# Patient Record
Sex: Female | Born: 1990 | Race: White | Hispanic: No | Marital: Single | Smoking: Never smoker
Health system: Southern US, Community
[De-identification: ages and names within clinical notes are randomized; demographics above are authoritative.]

## PROBLEM LIST (undated history)

## (undated) DIAGNOSIS — A749 Chlamydial infection, unspecified: Secondary | ICD-10-CM

## (undated) DIAGNOSIS — A159 Respiratory tuberculosis unspecified: Secondary | ICD-10-CM

## (undated) HISTORY — PX: ADENOIDECTOMY: SUR15

## (undated) HISTORY — PX: TYMPANOSTOMY TUBE PLACEMENT: SHX32

## (undated) HISTORY — PX: TONSILLECTOMY: SUR1361

## (undated) HISTORY — DX: Chlamydial infection, unspecified: A74.9

---
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
## 2010-06-01 ENCOUNTER — Encounter: Payer: Self-pay | Admitting: Otolaryngology

## 2010-09-23 NOTE — Op Note (Signed)
NAME:  Kimberly May, Kimberly May               ACCOUNT NO.:  [id number]   MEDICAL RECORD NO.:  [id number]          PATIENT TYPE:  INP   LOCATION:  9122                          FACILITY:  [facility]   PHYSICIAN:  Tilda Burrow, M.D. DATE OF BIRTH:  Aug 05, 1990   DATE OF PROCEDURE:  08/04/2007  DATE OF DISCHARGE:                               OPERATIVE REPORT   PREOPERATIVE DIAGNOSES:  1. Pregnancy 40+4 weeks.  2. Medical induction of labor.  3. Nonreassuring fetal heart rate status.  4. Light meconium discolored amniotic fluid.   POSTOPERATIVE DIAGNOSES:  1. Pregnancy 40+4 weeks.  2. Medical induction of labor.  3. Nonreassuring fetal heart rate status.  4. Light meconium discolored amniotic fluid.   PROCEDURE:  Primary low transverse cervical cesarean section.   SURGEON:  Ferguson.   ASSISTANT:  None.   ANESTHESIA:  Epidural.   COMPLICATIONS:  None.   FINDINGS:  A 6 pound 4 ounce female infant, light meconium discolored  amniotic fluid.  Presenting part had not entered the pelvic inlet.  Also, findings should include leg cord x1.   INDICATION:  A 20 year old admitted at 40+4 after prodromal labor with  cervix 2, 50%, -2.  Anterior cervix with Foley bulb cervical ripening  and discontinuation of Pitocin induction after she developed recurrent  late decelerations and occasional variable decelerations.  Amnio  infusion had been attempted, epidural catheter was in place, cervix  never progressed past 5 cm, 50%, -2 to -3 station.   DETAILS OF PROCEDURE:  The patient was taken to the operating room,  prepped and draped, analgesia confirmed with the epidural in place and  Pfannenstiel incision performed in standard fashion.  The presenting  part had really never entered the pelvis.  The transverse uterine  incision was performed, easily extended laterally with index finger  traction and the upward slightly, allowing rotation of the vertex into  the incision.  There was posterior knot molding  through the cervix.  This was rotated in the incision from the left occiput transverse  incision and then the baby delivered by fundal pressure.  The cord was  clamped and the baby placed in the care of the pediatrician in  attendance.  Apgars 9 and 9 were assigned.  There was no malodor to the  amniotic fluid which did have light meconium discoloration.  Membrane  showed light meconium discoloration as well.  Placenta delivered easily  intact, Schultze presentation, one tiny remnant of membrane was  extracted from inside the uterus.  The uterus was irrigated and  confirmed as being empty.  The uterus was closed with a single layer of  running locking 0 Chromic.  Bladder flap was loosely reapproximated with  running #2-0 Chromic.  The peritoneal cavity was inspected, confirmed as  hemostatic and adequately evacuated and closed with #2-0 Chromic.  The  fascia was closed with running #0 Vicryl, subcu fatty tissue  approximated with interrupted #2-0 plain sutures.  Staple closure of the  skin completed the procedure.  The patient went to the recovery room in  excellent condition with an EBL of 500 mL.  Sponge and needle counts  were correct.      Tilda Burrow, M.D.  Electronically Signed     JVF/MEDQ  D:  08/04/2007  T:  08/04/2007  Job:  [id number]   cc:   Donna Bernard, M.D.  Fax: [phone]

## 2010-09-23 NOTE — H&P (Signed)
NAME:  Kimberly May, Kimberly May               ACCOUNT NO.:  [id number]   MEDICAL RECORD NO.:  [id number]           PATIENT TYPE:   LOCATION:                                 FACILITY:   PHYSICIAN:  Tilda Burrow, M.D.      DATE OF BIRTH:   DATE OF ADMISSION:  DATE OF DISCHARGE:                              HISTORY & PHYSICAL   ADMITTING DIAGNOSES:  1. Pregnancy 40 weeks, 4 days.  2. Impending post dates.  3. Elective induction of labor.  4. Cervical favorability.   HISTORY OF PRESENT ILLNESS:  This 20 year old female, gravida 1, para 0,  is admitted for induction of labor. Gestational criteria as follows: LMP  10/23/06 placing her EDC at 07/30/07.  She is 40 weeks, 4 days for this  criteria ultrasound and 7 weeks 3 days on 12/10/07 placing her EDC at  3/17 making her 41 weeks 1 day and 20 week ultrasound as the same EDC of  07/26/07.  She is therefore admitted for a fully evoked cervical ripening  and Pitocin induction of labor.  She has been having lots of  contractions and pain in the last week.  Cervix has changed in the last  week and is now 1 to 2-cm 25% effaced, midposition, and a minus 2  station vertex presentation as well applied  to the cervix.   Pregnant labs include blood type A positive, Rubella immunity present,  urine direct screen negative, hemoglobin 12, hematocrit 38, Hepatitis  HIV, RPR, GC and Chlamydia all negative, MSAF normal, Group B Strep  negative. Glucose tolerance test normal at 124 mg/%.  If she has a boy,  desires circumcision, plans to bottle fed and desires Implanon  contraception in the future.  The baby will be taken to Dr. Simone Curia of [facility].   IMPRESSION:  1. Pregnancy 40 + 4 to 41 weeks.  2. Medical induction of labor.   PLAN:  Fully evoked cervical ripening times 4 hours and Pitocin  induction of labor, will likely require epidural.      Tilda Burrow, M.D.  Electronically Signed     JVF/MEDQ  D:  08/03/2007  T:   08/03/2007  Job:  [id number]   cc:   Donna Bernard, M.D.  Fax: [phone]

## 2010-09-23 NOTE — Discharge Summary (Signed)
NAME:  Kimberly May, Kimberly May               ACCOUNT NO.:  [id number]   MEDICAL RECORD NO.:  [id number]          PATIENT TYPE:  INP   LOCATION:  9122                          FACILITY:  [facility]   PHYSICIAN:  Tilda Burrow, M.D. DATE OF BIRTH:  [birth year]   DATE OF ADMISSION:  08/03/2007  DATE OF DISCHARGE:  08/07/2007                               DISCHARGE SUMMARY   DIAGNOSIS:  Induction for post date, primary low-transverse cesarean  section for nonreassuring fetal heart rate, on August 04, 2007, by Dr.  Emelda Fear.   POSTOPERATIVE DIAGNOSES:  Induction for post date, primary low-  transverse cesarean section for nonreassuring fetal heart rate, on August 04, 2007, by Dr. Emelda Fear.   HOSPITAL COURSE:  Kimberly May has had no problems during her hospital course.  She is taking p.o. solids and fluids well, up ambulating well, passing  gas, emptying her bladder without problems and desires discharge today.   PHYSICAL EXAMINATION:  VITAL SIGNS:  Stable.  HEART:  Regular rhythm and rate.  LUNGS:  Clear to auscultation bilaterally.  ABDOMEN:  Soft and nontender, bowel sounds x4.  Incision intact.  No  redness, swelling, or drainage.  Lochia small amount.  Fundus firm.  __________ .  Trace edema in the lower extremities.   ASSESSMENT:  Stable postoperative day #3.   DISCHARGE MEDICATIONS:  As follows:  1. Motrin 800 mg 1 p.o. q.8 h. p.r.n. pain and discomfort.  2. Percocet 5/325 one p.o. q.4 h. p.r.n. pain.  3. Chromagen Forte 1 p.o. b.i.d.   PLAN:  She has to follow up Wednesday at Dr. Rayna Sexton office for  staple removal or p.r.n.      Zerita Boers, Lanier Clam      Tilda Burrow, M.D.  Electronically Signed   DL/MEDQ  D:  91/47/8295  T:  08/08/2007  Job:  [id number]   cc:   Tilda Burrow, M.D.  Fax: [phone]

## 2011-02-02 LAB — CBC
Hemoglobin: 13.1
Platelets: 216
RBC: 3.54 — ABNORMAL LOW
RDW: 13.7
WBC: 12.4

## 2011-02-26 LAB — RAPID STREP SCREEN (MED CTR MEBANE ONLY): Streptococcus, Group A Screen (Direct): NEGATIVE

## 2011-02-26 LAB — STREP A DNA PROBE: Group A Strep Probe: NEGATIVE

## 2011-03-27 ENCOUNTER — Encounter (HOSPITAL_COMMUNITY): Payer: Self-pay

## 2011-03-27 ENCOUNTER — Encounter (HOSPITAL_COMMUNITY): Payer: Self-pay | Admitting: Pharmacy Technician

## 2011-03-27 ENCOUNTER — Encounter (HOSPITAL_COMMUNITY)
Admission: RE | Admit: 2011-03-27 | Discharge: 2011-03-27 | Disposition: A | Discharge status: Home / Self Care | Payer: Medicaid Other | Source: Ambulatory Visit | Attending: Otolaryngology | Admitting: Otolaryngology

## 2011-03-27 HISTORY — DX: Respiratory tuberculosis unspecified: A15.9

## 2011-03-27 LAB — SURGICAL PCR SCREEN
MRSA, PCR: NEGATIVE
Staphylococcus aureus: NEGATIVE

## 2011-03-27 LAB — CBC
Hemoglobin: 13.9 g/dL (ref 12.0–15.0)
RBC: 4.91 MIL/uL (ref 3.87–5.11)

## 2011-03-27 LAB — HCG, SERUM, QUALITATIVE: Preg, Serum: NEGATIVE

## 2011-03-27 NOTE — Pre-Procedure Instructions (Signed)
20 [place] T Farewell  03/27/2011   Your procedure is scheduled on:   Friday 04/10/11   Report to [facility] at 530 AM.  Call this number if you have problems the morning of surgery: [phone]   Remember:   Do not eat food:After Midnight.  Do not drink clear liquids: 4 Hours before arrival.  Take these medicines the morning of surgery with A SIP OF WATER:   Do not wear jewelry, make-up or nail polish.  Do not wear lotions, powders, or perfumes. You may wear deodorant.  Do not shave 48 hours prior to surgery.  Do not bring valuables to the hospital.  Contacts, dentures or bridgework may not be worn into surgery.  Leave suitcase in the car. After surgery it may be brought to your room.  For patients admitted to the hospital, checkout time is 11:00 AM the day of discharge.   Patients discharged the day of surgery will not be allowed to drive home.  Name and phone number of your driver:   MOM   Special Instructions: CHG Shower Use Special Wash: 1/2 bottle night before surgery and 1/2 bottle morning of surgery.   Please read over the following fact sheets that you were given: Pain Booklet, MRSA Information and Surgical Site Infection Prevention

## 2011-04-08 ENCOUNTER — Other Ambulatory Visit: Payer: Self-pay | Admitting: Otolaryngology

## 2011-04-10 ENCOUNTER — Observation Stay (HOSPITAL_COMMUNITY)
Admission: RE | Admit: 2011-04-10 | Discharge: 2011-04-10 | Disposition: A | Discharge status: Home / Self Care | Payer: Medicaid Other | Source: Ambulatory Visit | Attending: Otolaryngology | Admitting: Otolaryngology

## 2011-04-10 ENCOUNTER — Ambulatory Visit (HOSPITAL_COMMUNITY): Payer: Medicaid Other | Admitting: Certified Registered"

## 2011-04-10 ENCOUNTER — Encounter (HOSPITAL_COMMUNITY)
Admission: RE | Disposition: A | Discharge status: Home / Self Care | Payer: Self-pay | Source: Ambulatory Visit | Attending: Otolaryngology

## 2011-04-10 ENCOUNTER — Encounter (HOSPITAL_COMMUNITY): Payer: Self-pay | Admitting: Certified Registered"

## 2011-04-10 ENCOUNTER — Other Ambulatory Visit: Payer: Self-pay | Admitting: Otolaryngology

## 2011-04-10 DIAGNOSIS — H6990 Unspecified Eustachian tube disorder, unspecified ear: Secondary | ICD-10-CM | POA: Insufficient documentation

## 2011-04-10 DIAGNOSIS — J351 Hypertrophy of tonsils: Principal | ICD-10-CM | POA: Diagnosis present

## 2011-04-10 DIAGNOSIS — H7290 Unspecified perforation of tympanic membrane, unspecified ear: Secondary | ICD-10-CM | POA: Diagnosis present

## 2011-04-10 DIAGNOSIS — Z01812 Encounter for preprocedural laboratory examination: Secondary | ICD-10-CM | POA: Insufficient documentation

## 2011-04-10 DIAGNOSIS — H6980 Other specified disorders of Eustachian tube, unspecified ear: Secondary | ICD-10-CM | POA: Diagnosis present

## 2011-04-10 HISTORY — PX: TONSILLECTOMY: SHX5217

## 2011-04-10 SURGERY — TONSILLECTOMY
Anesthesia: General | Site: Mouth | Laterality: Right | Wound class: Clean Contaminated

## 2011-04-10 MED ORDER — CIPROFLOXACIN-DEXAMETHASONE 0.3-0.1 % OT SUSP
OTIC | Status: DC | PRN
  Administered 2011-04-10: 1 [drp] via OTIC

## 2011-04-10 MED ORDER — ONDANSETRON HCL 4 MG/2ML IJ SOLN: 4.0000 mg | Freq: Once | INTRAMUSCULAR | Status: DC | PRN

## 2011-04-10 MED ORDER — GLYCOPYRROLATE 0.2 MG/ML IJ SOLN
INTRAMUSCULAR | Status: DC | PRN
  Administered 2011-04-10: .4 mg via INTRAVENOUS

## 2011-04-10 MED ORDER — MORPHINE SULFATE 4 MG/ML IJ SOLN
2.0000 mg | INTRAMUSCULAR | Status: DC | PRN
  Administered 2011-04-10: 4 mg via INTRAVENOUS
  Filled 2011-04-10: qty 1

## 2011-04-10 MED ORDER — DEXAMETHASONE SODIUM PHOSPHATE 4 MG/ML IJ SOLN
INTRAMUSCULAR | Status: DC | PRN
  Administered 2011-04-10: 12 mg via INTRAVENOUS

## 2011-04-10 MED ORDER — NEOSTIGMINE METHYLSULFATE 1 MG/ML IJ SOLN
INTRAMUSCULAR | Status: DC | PRN
  Administered 2011-04-10: 3 mg via INTRAVENOUS

## 2011-04-10 MED ORDER — CIPROFLOXACIN-DEXAMETHASONE 0.3-0.1 % OT SUSP
4.0000 [drp] | Freq: Two times a day (BID) | OTIC | Status: DC
  Filled 2011-04-10: qty 7.5

## 2011-04-10 MED ORDER — MEPERIDINE HCL 25 MG/ML IJ SOLN
6.2500 mg | INTRAMUSCULAR | Status: DC | PRN
Start: 2011-04-10 — End: 2011-04-10

## 2011-04-10 MED ORDER — ROCURONIUM BROMIDE 100 MG/10ML IV SOLN
INTRAVENOUS | Status: DC | PRN
  Administered 2011-04-10: 35 mg via INTRAVENOUS

## 2011-04-10 MED ORDER — PROPOFOL 10 MG/ML IV EMUL
INTRAVENOUS | Status: DC | PRN
  Administered 2011-04-10: 150 mg via INTRAVENOUS

## 2011-04-10 MED ORDER — INFLUENZA VIRUS VACC SPLIT PF IM SUSP: 0.2500 mL | INTRAMUSCULAR | Status: DC

## 2011-04-10 MED ORDER — HYDROCODONE-ACETAMINOPHEN 7.5-500 MG/15ML PO SOLN: 15.0000 mL | ORAL | Status: AC | PRN

## 2011-04-10 MED ORDER — PROMETHAZINE HCL 25 MG RE SUPP
12.5000 mg | Freq: Four times a day (QID) | RECTAL | Status: DC | PRN
  Administered 2011-04-10: 14:00:00 via RECTAL
  Filled 2011-04-10: qty 1

## 2011-04-10 MED ORDER — ONDANSETRON HCL 4 MG/2ML IJ SOLN
INTRAMUSCULAR | Status: DC | PRN
  Administered 2011-04-10: 4 mg via INTRAVENOUS

## 2011-04-10 MED ORDER — MIDAZOLAM HCL 5 MG/5ML IJ SOLN
INTRAMUSCULAR | Status: DC | PRN
  Administered 2011-04-10: 2 mg via INTRAVENOUS

## 2011-04-10 MED ORDER — KCL IN DEXTROSE-NACL 20-5-0.45 MEQ/L-%-% IV SOLN
INTRAVENOUS | Status: DC
  Administered 2011-04-10: 1000 mL via INTRAVENOUS
  Filled 2011-04-10 (×3): qty 1000

## 2011-04-10 MED ORDER — PROMETHAZINE HCL 25 MG PO TABS: 12.5000 mg | ORAL_TABLET | Freq: Four times a day (QID) | ORAL | Status: DC | PRN

## 2011-04-10 MED ORDER — LACTATED RINGERS IV SOLN
INTRAVENOUS | Status: DC | PRN
  Administered 2011-04-10: 07:00:00 via INTRAVENOUS

## 2011-04-10 MED ORDER — HYDROMORPHONE HCL PF 1 MG/ML IJ SOLN
0.2500 mg | INTRAMUSCULAR | Status: DC | PRN
  Administered 2011-04-10: 0.5 mg via INTRAVENOUS

## 2011-04-10 MED ORDER — FENTANYL CITRATE 0.05 MG/ML IJ SOLN
INTRAMUSCULAR | Status: DC | PRN
  Administered 2011-04-10: 50 ug via INTRAVENOUS
  Administered 2011-04-10: 100 ug via INTRAVENOUS
  Administered 2011-04-10: 50 ug via INTRAVENOUS

## 2011-04-10 MED ORDER — SODIUM CHLORIDE 0.9 % IV SOLN
Freq: Once | INTRAVENOUS | Status: AC
  Administered 2011-04-10: 500 mL via INTRAVENOUS

## 2011-04-10 MED ORDER — SODIUM CHLORIDE 0.9 % IR SOLN
Status: DC | PRN
  Administered 2011-04-10: 1000 mL

## 2011-04-10 MED ORDER — HYDROCODONE-ACETAMINOPHEN 7.5-500 MG/15ML PO SOLN
10.0000 mL | ORAL | Status: DC | PRN
  Administered 2011-04-10 (×2): 15 mL via ORAL
  Filled 2011-04-10 (×2): qty 15

## 2011-04-10 SURGICAL SUPPLY — 36 items
BALL CTTN LRG ABS STRL LF (GAUZE/BANDAGES/DRESSINGS) ×2
BLADE MYRINGOTOMY 6 SPEAR HDL (BLADE) ×1 IMPLANT
CANISTER SUCTION 2500CC (MISCELLANEOUS) ×3 IMPLANT
CATH ROBINSON RED A/P 10FR (CATHETERS) IMPLANT
CLEANER TIP ELECTROSURG 2X2 (MISCELLANEOUS) ×3 IMPLANT
CLOTH BEACON ORANGE TIMEOUT ST (SAFETY) ×3 IMPLANT
COAGULATOR SUCT SWTCH 10FR 6 (ELECTROSURGICAL) ×5 IMPLANT
COTTONBALL LRG STERILE PKG (GAUZE/BANDAGES/DRESSINGS) ×1 IMPLANT
CRADLE DONUT ADULT HEAD (MISCELLANEOUS) IMPLANT
ELECT COATED BLADE 2.86 ST (ELECTRODE) ×4 IMPLANT
ELECT REM PT RETURN 9FT ADLT (ELECTROSURGICAL)
ELECT REM PT RETURN 9FT PED (ELECTROSURGICAL)
ELECTRODE REM PT RETRN 9FT PED (ELECTROSURGICAL) IMPLANT
ELECTRODE REM PT RTRN 9FT ADLT (ELECTROSURGICAL) IMPLANT
GAUZE SPONGE 4X4 16PLY XRAY LF (GAUZE/BANDAGES/DRESSINGS) ×3 IMPLANT
GLOVE BIO SURGEON STRL SZ7.5 (GLOVE) ×3 IMPLANT
GLOVE SURG SS PI 6.5 STRL IVOR (GLOVE) ×1 IMPLANT
GLOVE SURG SS PI 7.5 STRL IVOR (GLOVE) ×2 IMPLANT
GOWN STRL NON-REIN LRG LVL3 (GOWN DISPOSABLE) ×6 IMPLANT
KIT BASIN OR (CUSTOM PROCEDURE TRAY) ×3 IMPLANT
KIT ROOM TURNOVER OR (KITS) ×3 IMPLANT
NS IRRIG 1000ML POUR BTL (IV SOLUTION) ×3 IMPLANT
PACK SURGICAL SETUP 50X90 (CUSTOM PROCEDURE TRAY) ×3 IMPLANT
PAD ARMBOARD 7.5X6 YLW CONV (MISCELLANEOUS) ×6 IMPLANT
PENCIL BUTTON HOLSTER BLD 10FT (ELECTRODE) ×4 IMPLANT
SPECIMEN JAR SMALL (MISCELLANEOUS) ×6 IMPLANT
SPONGE TONSIL 1.25 RF SGL STRG (GAUZE/BANDAGES/DRESSINGS) ×3 IMPLANT
SYR BULB 3OZ (MISCELLANEOUS) ×3 IMPLANT
TOWEL OR 17X24 6PK STRL BLUE (TOWEL DISPOSABLE) ×6 IMPLANT
TUBE CONNECTING 12X1/4 (SUCTIONS) ×3 IMPLANT
TUBE EAR T MOD 1.32X4.8 BL (OTOLOGIC RELATED) ×1 IMPLANT
TUBE SALEM SUMP 10F W/ARV (TUBING) IMPLANT
TUBE SALEM SUMP 12R W/ARV (TUBING) ×3 IMPLANT
TUBE SALEM SUMP 14F W/ARV (TUBING) ×1 IMPLANT
TUBE SALEM SUMP 16 FR W/ARV (TUBING) IMPLANT
WATER STERILE IRR 1000ML POUR (IV SOLUTION) ×2 IMPLANT

## 2011-04-10 NOTE — H&P (Signed)
Kimberly May is an 20 y.o. female.   Chief Complaint: Ear pressure, hearing loss, obstructed breathing HPI: 20 year old with obstructed breathing at night, occasional step throat, and ear pressure with hearing loss.  Past Medical History  Diagnosis Date  . Tuberculosis 15 YRS AGO     FATHER HAD TB , PT WAS MED [place]    Past Surgical History  Procedure Date  . Tympanostomy tube placement DONE TWICE     BILAT    [facility])  . Cesarean section 2009     No family history on file. Social History:  reports that she has never smoked. She does not have any smokeless tobacco history on file. She reports that she does not drink alcohol or use illicit drugs.  Allergies:  Allergies  Allergen Reactions  . Penicillins Rash    No current facility-administered medications on file as of 04/10/2011.   No current outpatient prescriptions on file as of 04/10/2011.    No results found for this or any previous visit (from the past 48 hour(s)). No results found.  Review of Systems  All other systems reviewed and are negative.    Blood pressure 104/69, pulse 99, temperature 98.1 F (36.7 C), resp. rate 16, SpO2 98.00%. Physical Exam  Constitutional: She is oriented to person, place, and time. She appears well-developed and well-nourished. No distress.  HENT:  Head: Normocephalic and atraumatic.  Right Ear: Hearing and external ear normal. Right ear perforated TM: deeply retracted.  Left Ear: External ear normal. Tympanic membrane is perforated (10% inferior dry perforation).  Nose: Nose normal.  Mouth/Throat: Uvula is midline, oropharynx is clear and moist and mucous membranes are normal. Tonsillar abscesses: tonsils 4+  Eyes: Conjunctivae and EOM are normal. Pupils are equal, round, and reactive to light.  Neck: Normal range of motion. Neck supple.  Cardiovascular:       Defer to anesthesiology.  Respiratory:       Defer to anesthesiology.  GI:       Did not examine.    Genitourinary:       Did not examine.  Musculoskeletal: Normal range of motion.  Neurological: She is alert and oriented to person, place, and time. No cranial nerve deficit.  Skin: Skin is warm and dry.  Psychiatric: She has a normal mood and affect.     Assessment/Plan Eustachian tube dysfunction, tympanic membrane perforation, tonsillar hypertrophy. Tonsillectomy, right T-tube placement.  Buck Mcaffee D 04/10/2011, 7:34 AM

## 2011-04-10 NOTE — Brief Op Note (Signed)
04/10/2011  8:15 AM  PATIENT:  [place] T Weikel  20 y.o. female  PRE-OPERATIVE DIAGNOSIS:  chronic tonsillitis, eustachian tube dysfunction  POST-OPERATIVE DIAGNOSIS:  chronic tonsillitis, eustachian tube dysfunction  PROCEDURE:  Procedure(s): TONSILLECTOMY RIGHT MYRINGOTOMY WITH T-TUBE PLACEMENT  SURGEON:  Surgeon(s): Antony Contras  PHYSICIAN ASSISTANT:   ASSISTANTS: none   ANESTHESIA:   general  EBL:     BLOOD ADMINISTERED:none  DRAINS: none   LOCAL MEDICATIONS USED:  NONE  SPECIMEN:  Source of Specimen:  Right and left tonsils.  DISPOSITION OF SPECIMEN:  PATHOLOGY  COUNTS:  NO RMAL.  TOURNIQUET:  * No tourniquets in log *  DICTATION: .Note written in EPIC and Other Dictation: Dictation Number [id number]  PLAN OF CARE: Admit for overnight observation  PATIENT DISPOSITION:  PACU - hemodynamically stable.   Delay start of Pharmacological VTE agent (>24hrs) due to surgical blood loss or risk of bleeding:  {YES/NO/NOT APPLICABLE:20182

## 2011-04-10 NOTE — Anesthesia Preprocedure Evaluation (Addendum)
Anesthesia Evaluation  Patient identified by MRN, date of birth, ID band Patient awake    Reviewed: Allergy & Precautions, NPO status   Airway Mallampati: I TM Distance: >3 FB Neck ROM: Full    Dental  (+) Teeth Intact and Dental Advisory Given   Pulmonary          Cardiovascular     Neuro/Psych    GI/Hepatic   Endo/Other    Renal/GU      Musculoskeletal   Abdominal   Peds  Hematology   Anesthesia Other Findings   Reproductive/Obstetrics                           Anesthesia Physical Anesthesia Plan  ASA: II  Anesthesia Plan: General   Post-op Pain Management:    Induction: Intravenous  Airway Management Planned: Oral ETT  Additional Equipment:   Intra-op Plan:   Post-operative Plan: Extubation in OR  Informed Consent: I have reviewed the patients History and Physical, chart, labs and discussed the procedure including the risks, benefits and alternatives for the proposed anesthesia with the patient or authorized representative who has indicated his/her understanding and acceptance.     Plan Discussed with: CRNA and Surgeon  Anesthesia Plan Comments:         Anesthesia Quick Evaluation

## 2011-04-10 NOTE — Preoperative (Signed)
Beta Blockers   Reason not to administer Beta Blockers:Not Applicable 

## 2011-04-10 NOTE — Progress Notes (Signed)
arousable

## 2011-04-10 NOTE — Op Note (Signed)
NAME:  Glockner, [place]               ACCOUNT NO.:  [id number]  MEDICAL RECORD NO.:  [id number]  LOCATION:  [facility]                         FACILITY:  [facility]  PHYSICIAN:  Antony Contras, MD     DATE OF BIRTH:  11-13-1990  DATE OF PROCEDURE:  04/10/2011 DATE OF DISCHARGE:                              OPERATIVE REPORT   PREOPERATIVE DIAGNOSES: 1. Tonsillar hypertrophy. 2. Eustachian tube dysfunction. 3. Left tympanic membrane perforation.  POSTOPERATIVE DIAGNOSES: 1. Tonsillar hypertrophy. 2. Eustachian tube dysfunction. 3. Left tympanic membrane perforation.  PROCEDURE: 1. Tonsillectomy. 2. Right myringotomy with T-tube placement.  SURGEON:  Excell Seltzer. Jenne Pane, M.D.  ANESTHESIA:  General endotracheal anesthesia.  COMPLICATIONS:  None.  INDICATION:  The patient is a 20 year old white female, who has a 3-year history of enlarged tonsils that hurt at times and make it hard to swallow.  Her voice sounds muffled.  She snores pretty badly and has been told that she stops breathing at times at night.  She has tonsillitis about once every year.  In addition, she has had decreased hearing in the right ear for a couple of months.  She has required placement of tubes in the past twice.  She was found to have a perforation in the left tympanic membrane, but a markedly retracted right tympanic membrane.  In addition, she was found to have 4+ tonsils and presents to the operating room for surgical management.  FINDINGS:  As above.  DESCRIPTION OF PROCEDURE:  The patient was identified in the holding room and informed consent having been obtained including discussion of risks, benefits, and alternatives, the patient was brought to the operative suite and placed on the operating table in supine position. Anesthesia was induced.  The patient was intubated by Anesthesia team without difficulty.  The patient was given intravenous steroids during the case.  The eyes taped and closed.  The right  ear was inspected under the operating microscope using a speculum.  Cerumen was removed with a curette.  A vertical incision was made just anterior to the umbo using a myringotomy knife and the middle ear was suctioned of very thick mucoid effusion.  A Richards modified T-tube was then placed with some difficulty into position.  Ciprodex drops and a cotton ball were then added.  After this, the bed was turned 90 degrees from anesthesia and the head wrap was placed around the patient's head.  A Crowe-Davis retractor was inserted mouth and opened to reveal the oropharynx.  This was placed in suspension on a Mayo stand.  The right tonsil was grasped with a curved Allis retracted medially while a curvilinear incision was made along the anterior tonsillar pillar using Bovie cautery on a setting of 20. Dissection was continued in the subcapsular plane until the tonsil was removed.  The same thing was then performed on the left side.  Tonsils were passed separately for Pathology.  Bleeding was controlled in each side using suction cautery in a setting of 30.  After this was completed, the nose and throat were copiously irrigated with saline and a flexible catheter was passed down the esophagus to suck out the stomach and esophagus.  The retractor was taken  out of suspension and removed from the patient's mouth.  She was turned back to anesthesia for wake up, was extubated, and moved to recovery room in stable condition.     Antony Contras, MD     DDB/MEDQ  D:  04/10/2011  T:  04/10/2011  Job:  [id number]

## 2011-04-10 NOTE — Progress Notes (Signed)
Pt is voiding and tolerating full liquid diet. Ambulated in the hall. States nausea relieved. Discharged to home accompanied by boyfriend.Malen Gauze Luter\ 04/10/2011

## 2011-04-10 NOTE — Transfer of Care (Signed)
Immediate Anesthesia Transfer of Care Note  Patient: Kimberly May  Procedure(s) Performed:  TONSILLECTOMY; MYRINGOTOMY WITH TUBE PLACEMENT  Patient Location: PACU  Anesthesia Type: General  Level of Consciousness: awake and oriented  Airway & Oxygen Therapy: Patient Spontanous Breathing and Patient connected to nasal cannula oxygen  Post-op Assessment: Report given to PACU RN  Post vital signs: Reviewed and stable  Complications: No apparent anesthesia complications

## 2011-04-10 NOTE — Anesthesia Postprocedure Evaluation (Signed)
  Anesthesia Post-op Note  Patient: Kimberly May  Procedure(s) Performed:  TONSILLECTOMY; MYRINGOTOMY WITH TUBE PLACEMENT  Patient Location: PACU  Anesthesia Type: General  Level of Consciousness: awake  Airway and Oxygen Therapy: Patient Spontanous Breathing  Post-op Pain: none  Post-op Assessment: Post-op Vital signs reviewed  Post-op Vital Signs: stable  Complications: No apparent anesthesia complications

## 2011-04-14 ENCOUNTER — Encounter (HOSPITAL_COMMUNITY): Payer: Self-pay | Admitting: Otolaryngology

## 2013-01-19 ENCOUNTER — Emergency Department (HOSPITAL_COMMUNITY)
Admission: EM | Admit: 2013-01-19 | Discharge: 2013-01-19 | Disposition: A | Discharge status: Home / Self Care | Payer: Medicaid Other | Attending: Emergency Medicine | Admitting: Emergency Medicine

## 2013-01-19 ENCOUNTER — Encounter (HOSPITAL_COMMUNITY): Payer: Self-pay | Admitting: *Deleted

## 2013-01-19 DIAGNOSIS — Z8611 Personal history of tuberculosis: Secondary | ICD-10-CM | POA: Insufficient documentation

## 2013-01-19 DIAGNOSIS — Z880 Allergy status to penicillin: Secondary | ICD-10-CM | POA: Insufficient documentation

## 2013-01-19 DIAGNOSIS — L0591 Pilonidal cyst without abscess: Secondary | ICD-10-CM | POA: Insufficient documentation

## 2013-01-19 DIAGNOSIS — Z79899 Other long term (current) drug therapy: Secondary | ICD-10-CM | POA: Insufficient documentation

## 2013-01-19 MED ORDER — SULFAMETHOXAZOLE-TRIMETHOPRIM 800-160 MG PO TABS: 1.0000 | ORAL_TABLET | Freq: Two times a day (BID) | ORAL | Status: DC

## 2013-01-19 MED ORDER — NAPROXEN 500 MG PO TABS: 500.0000 mg | ORAL_TABLET | Freq: Two times a day (BID) | ORAL | Status: DC

## 2013-01-19 NOTE — ED Notes (Signed)
RN witnessed rectal exam by EDP. Pt tolerated well.

## 2013-01-19 NOTE — ED Notes (Signed)
C/o knot at the end of back above butt "CRACK"

## 2013-01-19 NOTE — ED Provider Notes (Signed)
CSN: [id number]     Arrival date & time 01/19/13  0801 History  This chart was scribed for Benny Lennert, MD, by Yevette Edwards, ED Scribe. This patient was seen in room APA03/APA03 and the patient's care was started at 8:10 AM.  First MD Initiated Contact with Patient 01/19/13 [phone]     Chief Complaint  Patient presents with  . Cyst   (Consider location/radiation/quality/duration/timing/severity/associated sxs/prior Treatment) Patient is a 22 y.o. female presenting with abscess. The history is provided by the patient. No language interpreter was used.  Abscess Location:  Torso Torso abscess location:  Lower back Abscess quality: induration and painful   Duration:  2 days Progression:  Unchanged Pain details:    Quality:  Throbbing   Severity:  Moderate   Timing:  Intermittent   Progression:  Unchanged Chronicity:  New Relieved by:  Nothing Worsened by:  Nothing tried Ineffective treatments:  None tried Associated symptoms: no fatigue and no headaches   Risk factors: prior abscess    HPI Comments: Kimberly May is a 22 y.o. female who presents to the Emergency Department complaining of an acute, suspected cyst to her lower back which has been present for two days and which is painful, and she rates the pain as 6/10.  She has a h/o boils, but she denies any h/o boils to the current location. The pt denies any recent injuries to her back.   Dr. Regino Schultze is her PCP.   Past Medical History  Diagnosis Date  . Tuberculosis 15 YRS AGO     FATHER HAD TB , PT WAS MED [place]   Past Surgical History  Procedure Laterality Date  . Tympanostomy tube placement  DONE TWICE     BILAT    [facility])  . Cesarean section  2009   . Tonsillectomy  04/10/2011    Procedure: TONSILLECTOMY;  Surgeon: Antony Contras;  Location: [facility] OR;  Service: ENT;  Laterality: Bilateral;   No family history on file. History  Substance Use Topics  . Smoking status: Never Smoker   . Smokeless tobacco: Not on  file  . Alcohol Use: No   No OB history provided.  Review of Systems  Constitutional: Negative for appetite change and fatigue.  HENT: Negative for congestion, sinus pressure and ear discharge.   Eyes: Negative for discharge.  Respiratory: Negative for cough.   Cardiovascular: Negative for chest pain.  Gastrointestinal: Negative for abdominal pain and diarrhea.  Genitourinary: Negative for frequency and hematuria.  Musculoskeletal: Negative for back pain.  Skin: Negative for rash.       Cyst  Neurological: Negative for seizures and headaches.  Psychiatric/Behavioral: Negative for hallucinations.    Allergies  Penicillins  Home Medications   Current Outpatient Rx  Name  Route  Sig  Dispense  Refill  . etonogestrel (IMPLANON) 68 MG IMPL implant   Subcutaneous   Inject 1 each into the skin once.            Triage Vitals:  BP 102/62  Pulse 94  Temp(Src) 97.7 F (36.5 C)  Resp 20  Ht 5' (1.524 m)  Wt 203 lb (92.08 kg)  BMI 39.65 kg/m2  SpO2 100%  Physical Exam  Constitutional: She is oriented to person, place, and time. She appears well-developed.  HENT:  Head: Normocephalic.  Eyes: Conjunctivae and EOM are normal. No scleral icterus.  Neck: Neck supple. No tracheal deviation present. No thyromegaly present.  Cardiovascular: Normal rate and regular rhythm.  Exam  reveals no gallop and no friction rub.   No murmur heard. Pulmonary/Chest: No stridor. She has no wheezes. She has no rales. She exhibits no tenderness.  Abdominal: She exhibits no distension. There is no tenderness. There is no rebound.  Musculoskeletal: Normal range of motion. She exhibits no edema.  Lymphadenopathy:    She has no cervical adenopathy.  Neurological: She is oriented to person, place, and time. Coordination normal.  Skin: Skin is warm. No rash noted. No erythema.  Right upper coccyx, there is an area of 2 cm diameter which is tender and slightly indurated.   Psychiatric: She has a  normal mood and affect. Her behavior is normal.    ED Course  Procedures (including critical care time)  DIAGNOSTIC STUDIES: Oxygen Saturation is 100% on room air, normal by my interpretation.    COORDINATION OF CARE:  8:34 AM- Discussed treatment plan with patient, and the patient agreed to the plan.   Labs Review Labs Reviewed - No data to display Imaging Review No results found.  MDM  No diagnosis found.   The chart was scribed for me under my direct supervision.  I personally performed the history, physical, and medical decision making and all procedures in the evaluation of this patient.Benny Lennert, MD 01/19/13 [phone]

## 2013-05-24 ENCOUNTER — Emergency Department (HOSPITAL_COMMUNITY)
Admission: EM | Admit: 2013-05-24 | Discharge: 2013-05-25 | Disposition: A | Discharge status: Home / Self Care | Payer: BC Managed Care – PPO | Attending: Emergency Medicine | Admitting: Emergency Medicine

## 2013-05-24 ENCOUNTER — Encounter (HOSPITAL_COMMUNITY): Payer: Self-pay | Admitting: Emergency Medicine

## 2013-05-24 DIAGNOSIS — J020 Streptococcal pharyngitis: Secondary | ICD-10-CM | POA: Insufficient documentation

## 2013-05-24 DIAGNOSIS — Z880 Allergy status to penicillin: Secondary | ICD-10-CM | POA: Insufficient documentation

## 2013-05-24 DIAGNOSIS — Z8611 Personal history of tuberculosis: Secondary | ICD-10-CM | POA: Insufficient documentation

## 2013-05-24 DIAGNOSIS — IMO0001 Reserved for inherently not codable concepts without codable children: Secondary | ICD-10-CM | POA: Insufficient documentation

## 2013-05-24 LAB — RAPID STREP SCREEN (MED CTR MEBANE ONLY): STREPTOCOCCUS, GROUP A SCREEN (DIRECT): POSITIVE — AB

## 2013-05-24 MED ORDER — METHYLPREDNISOLONE SODIUM SUCC 125 MG IJ SOLR
125.0000 mg | Freq: Once | INTRAMUSCULAR | Status: AC
  Administered 2013-05-24: 125 mg via INTRAVENOUS
  Filled 2013-05-24: qty 2

## 2013-05-24 MED ORDER — SODIUM CHLORIDE 0.9 % IV BOLUS (SEPSIS)
1000.0000 mL | Freq: Once | INTRAVENOUS | Status: AC
  Administered 2013-05-24: 1000 mL via INTRAVENOUS

## 2013-05-24 MED ORDER — ONDANSETRON HCL 4 MG/2ML IJ SOLN
4.0000 mg | Freq: Once | INTRAMUSCULAR | Status: AC
  Administered 2013-05-24: 4 mg via INTRAVENOUS
  Filled 2013-05-24: qty 2

## 2013-05-24 MED ORDER — AZITHROMYCIN 250 MG PO TABS: ORAL_TABLET | ORAL | Status: DC

## 2013-05-24 MED ORDER — KETOROLAC TROMETHAMINE 30 MG/ML IJ SOLN
30.0000 mg | Freq: Once | INTRAMUSCULAR | Status: AC
  Administered 2013-05-24: 30 mg via INTRAVENOUS
  Filled 2013-05-24: qty 1

## 2013-05-24 MED ORDER — DEXTROSE 5 % IV SOLN
500.0000 mg | Freq: Once | INTRAVENOUS | Status: AC
  Administered 2013-05-24: 500 mg via INTRAVENOUS

## 2013-05-24 NOTE — Discharge Instructions (Signed)
Pharyngitis Pharyngitis is a sore throat (pharynx). There is redness, pain, and swelling of your throat. HOME CARE   Drink enough fluids to keep your pee (urine) clear or pale yellow.  Only take medicine as told by your doctor.  You may get sick again if you do not take medicine as told. Finish your medicines, even if you start to feel better.  Do not take aspirin.  Rest.  Rinse your mouth (gargle) with salt water ( tsp of salt per 1 qt of water) every 1 2 hours. This will help the pain.  If you are not at risk for choking, you can suck on hard candy or sore throat lozenges. GET HELP IF:  You have large, tender lumps on your neck.  You have a rash.  You cough up green, yellow-brown, or bloody spit. GET HELP RIGHT AWAY IF:   You have a stiff neck.  You drool or cannot swallow liquids.  You throw up (vomit) or are not able to keep medicine or liquids down.  You have very bad pain that does not go away with medicine.  You have problems breathing (not from a stuffy nose). MAKE SURE YOU:   Understand these instructions.  Will watch your condition.  Will get help right away if you are not doing well or get worse. Document Released: 10/14/2007 Document Revised: 02/15/2013 Document Reviewed: 01/02/2013 [facility] Patient Information 2014 [employer], [place].   Strep test was positive. Increase fluids. Gargle salt water. Tylenol or ibuprofen for pain or fever. Start antibiotic tomorrow evening for 4 more days.

## 2013-05-24 NOTE — ED Notes (Signed)
Fever 100.9, sore throat, headaches, body aches, stuffy nose. Denies n/v/d. Throat sounds swollen when pt talks. nad at this time. Mm wet.

## 2013-05-24 NOTE — ED Notes (Signed)
Gave patient ice water to drink as requested and approved by MD. 

## 2013-05-24 NOTE — ED Provider Notes (Signed)
CSN: [id number]     Arrival date & time 05/24/13  1742 History  This chart was scribed for Kimberly HutchingBrian Cheryal Salas, MD by Ardelia Memsylan Malpass, ED Scribe. This patient was seen in room APA18/APA18 and the patient's care was started at 6:45 PM.    Chief Complaint  Patient presents with  . Generalized Body Aches    The history is provided by the patient. No language interpreter was used.    HPI Comments: [name] is a 23 y.o. female who presents to the Emergency Department complaining of a constant, gradually worsening sore throat over the past 2 days. She reports associated cough, fever, chills and generalized myalgias over the past 2 days. She states that she took her highest temperature at home to be 100.9 F. ED temperature is 98.1 F. She states that she has been drinking and urinating normally since the onset of symptoms, however, she has not eaten today. She states that she has had sick contacts with her son, who was diagnosed with Strep throat yesterday by Dr. Gerda DissLuking. She denies neck pain, neck stiffness or any other symptoms.   Past Medical History  Diagnosis Date  . Tuberculosis 15 YRS AGO     FATHER HAD TB , PT WAS MED TX   Past Surgical History  Procedure Laterality Date  . Tympanostomy tube placement  DONE TWICE     [facility]  . Cesarean section  2009   . Tonsillectomy  04/10/2011    Procedure: TONSILLECTOMY;  Surgeon: Antony Contraswight D Bates;  Location: [facility] OR;  Service: ENT;  Laterality: Bilateral;   History reviewed. No pertinent family history. History  Substance Use Topics  . Smoking status: Never Smoker   . Smokeless tobacco: Not on file  . Alcohol Use: No   OB History   Grav Para Term Preterm Abortions TAB SAB Ect Mult Living                 Review of Systems A complete 10 system review of systems was obtained and all systems are negative except as noted in the HPI and PMH.   Allergies  Penicillins  Home Medications   Current Outpatient Rx  Name  Route  Sig   Dispense  Refill  . etonogestrel (IMPLANON) 68 MG IMPL implant   Subcutaneous   Inject 1 each into the skin once.            Triage Vitals: BP 123/72  Pulse 130  Temp(Src) 98.1 F (36.7 C) (Oral)  Resp 20  Ht 5' (1.524 m)  Wt 208 lb (94.348 kg)  BMI 40.62 kg/m2  SpO2 97%  Physical Exam  Nursing note and vitals reviewed. Constitutional: She is oriented to person, place, and time. She appears well-developed and well-nourished.  HENT:  Head: Normocephalic and atraumatic.  Throat appears red and inflamed. No signs of peritonsillar abscess.  Eyes: Conjunctivae and EOM are normal. Pupils are equal, round, and reactive to light.  Neck: Normal range of motion. Neck supple.  Cardiovascular: Normal rate, regular rhythm and normal heart sounds.   Pulmonary/Chest: Effort normal and breath sounds normal.  Abdominal: Soft. Bowel sounds are normal.  Musculoskeletal: Normal range of motion.  Neurological: She is alert and oriented to person, place, and time.  Skin: Skin is warm and dry.  Psychiatric: She has a normal mood and affect. Her behavior is normal.    ED Course  Procedures (including critical care time)  DIAGNOSTIC STUDIES: Oxygen Saturation is 97%  on RA, normal by my interpretation.    COORDINATION OF CARE: 6:50 PM- Discussed plan to obtain a Rapid strep screen. Pt advised of plan for treatment and pt agrees.  7:15 PM- IV fluids, Toradol, Solu-Medrol and Zofran ordered.   Medications  sodium chloride 0.9 % bolus 1,000 mL (0 mLs Intravenous Stopped 05/24/13 2020)  sodium chloride 0.9 % bolus 1,000 mL (0 mLs Intravenous Stopped 05/24/13 2107)  methylPREDNISolone sodium succinate (SOLU-MEDROL) 125 mg/2 mL injection 125 mg (125 mg Intravenous Given 05/24/13 1922)  ketorolac (TORADOL) 30 MG/ML injection 30 mg (30 mg Intravenous Given 05/24/13 1922)  ondansetron (ZOFRAN) injection 4 mg (4 mg Intravenous Given 05/24/13 1923)  azithromycin (ZITHROMAX) 500 mg in dextrose 5 % 250 mL  IVPB (0 mg Intravenous Stopped 05/24/13 2230)   Labs Review Labs Reviewed  RAPID STREP SCREEN - Abnormal; Notable for the following:    Streptococcus, Group A Screen (Direct) POSITIVE (*)    All other components within normal limits   Imaging Review No results found.  EKG Interpretation   None       MDM  No diagnosis found. Strep test positive.  No evidence of meningitis. Patient feels better after IV fluids, IV steroids, IV Toradol. Patient is allergic to penicillin.   Will start Zithromax.   I personally performed the services described in this documentation, which was scribed in my presence. The recorded information has been reviewed and is accurate.    Kimberly Hutching, MD 05/25/13 [phone]

## 2013-06-04 ENCOUNTER — Encounter (HOSPITAL_COMMUNITY): Payer: BC Managed Care – PPO | Admitting: Certified Registered Nurse Anesthetist

## 2013-06-04 ENCOUNTER — Encounter (HOSPITAL_COMMUNITY): Payer: Self-pay | Admitting: Emergency Medicine

## 2013-06-04 ENCOUNTER — Encounter (HOSPITAL_COMMUNITY)
Admission: EM | Disposition: A | Discharge status: Home / Self Care | Payer: Self-pay | Source: Home / Self Care | Attending: Emergency Medicine

## 2013-06-04 ENCOUNTER — Emergency Department (HOSPITAL_COMMUNITY): Payer: BC Managed Care – PPO | Admitting: Certified Registered Nurse Anesthetist

## 2013-06-04 ENCOUNTER — Observation Stay (HOSPITAL_COMMUNITY)
Admission: EM | Admit: 2013-06-04 | Discharge: 2013-06-05 | Disposition: A | Discharge status: Home / Self Care | Payer: BC Managed Care – PPO | Attending: Otolaryngology | Admitting: Otolaryngology

## 2013-06-04 ENCOUNTER — Emergency Department (HOSPITAL_COMMUNITY): Payer: BC Managed Care – PPO

## 2013-06-04 DIAGNOSIS — J352 Hypertrophy of adenoids: Secondary | ICD-10-CM | POA: Insufficient documentation

## 2013-06-04 DIAGNOSIS — J390 Retropharyngeal and parapharyngeal abscess: Principal | ICD-10-CM | POA: Diagnosis present

## 2013-06-04 HISTORY — PX: INCISION AND DRAINAGE ABSCESS: SHX5864

## 2013-06-04 LAB — CBC WITH DIFFERENTIAL/PLATELET
Basophils Absolute: 0 10*3/uL (ref 0.0–0.1)
Basophils Relative: 0 % (ref 0–1)
Eosinophils Absolute: 0.1 10*3/uL (ref 0.0–0.7)
Eosinophils Relative: 0 % (ref 0–5)
HCT: 36.2 % (ref 36.0–46.0)
Hemoglobin: 12.3 g/dL (ref 12.0–15.0)
Lymphocytes Relative: 9 % — ABNORMAL LOW (ref 12–46)
Lymphs Abs: 1.5 10*3/uL (ref 0.7–4.0)
MCH: 28 pg (ref 26.0–34.0)
MCHC: 34 g/dL (ref 30.0–36.0)
MCV: 82.5 fL (ref 78.0–100.0)
Monocytes Absolute: 1.3 10*3/uL — ABNORMAL HIGH (ref 0.1–1.0)
Monocytes Relative: 7 % (ref 3–12)
Neutro Abs: 14.2 10*3/uL — ABNORMAL HIGH (ref 1.7–7.7)
Neutrophils Relative %: 83 % — ABNORMAL HIGH (ref 43–77)
Platelets: 172 10*3/uL (ref 150–400)
RBC: 4.39 MIL/uL (ref 3.87–5.11)
RDW: 13.1 % (ref 11.5–15.5)
WBC: 17 10*3/uL — ABNORMAL HIGH (ref 4.0–10.5)

## 2013-06-04 LAB — COMPREHENSIVE METABOLIC PANEL
ALT: 18 U/L (ref 0–35)
AST: 17 U/L (ref 0–37)
Albumin: 3.3 g/dL — ABNORMAL LOW (ref 3.5–5.2)
Alkaline Phosphatase: 75 U/L (ref 39–117)
BUN: 31 mg/dL — ABNORMAL HIGH (ref 6–23)
CO2: 24 mEq/L (ref 19–32)
Calcium: 9 mg/dL (ref 8.4–10.5)
Chloride: 97 mEq/L (ref 96–112)
Creatinine, Ser: 1.46 mg/dL — ABNORMAL HIGH (ref 0.50–1.10)
GFR calc Af Amer: 58 mL/min — ABNORMAL LOW (ref >90)
GFR calc non Af Amer: 50 mL/min — ABNORMAL LOW (ref >90)
Glucose, Bld: 122 mg/dL — ABNORMAL HIGH (ref 70–99)
Potassium: 4.2 mEq/L (ref 3.7–5.3)
Sodium: 135 mEq/L — ABNORMAL LOW (ref 137–147)
Total Bilirubin: 0.6 mg/dL (ref 0.3–1.2)
Total Protein: 8.5 g/dL — ABNORMAL HIGH (ref 6.0–8.3)

## 2013-06-04 LAB — MONONUCLEOSIS SCREEN: Mono Screen: NEGATIVE — AB

## 2013-06-04 SURGERY — INCISION AND DRAINAGE, ABSCESS
Anesthesia: General

## 2013-06-04 MED ORDER — CLINDAMYCIN PHOSPHATE 600 MG/50ML IV SOLN
600.0000 mg | Freq: Once | INTRAVENOUS | Status: AC
Start: 2013-06-04 — End: 2013-06-04
  Administered 2013-06-04: 600 mg via INTRAVENOUS
  Filled 2013-06-04: qty 50

## 2013-06-04 MED ORDER — FENTANYL CITRATE 0.05 MG/ML IJ SOLN
INTRAMUSCULAR | Status: AC
  Filled 2013-06-04: qty 5

## 2013-06-04 MED ORDER — IOHEXOL 300 MG/ML  SOLN
80.0000 mL | Freq: Once | INTRAMUSCULAR | Status: AC | PRN
  Administered 2013-06-04: 80 mL via INTRAVENOUS

## 2013-06-04 MED ORDER — PROMETHAZINE HCL 25 MG/ML IJ SOLN: 6.2500 mg | INTRAMUSCULAR | Status: DC | PRN

## 2013-06-04 MED ORDER — ONDANSETRON HCL 4 MG/2ML IJ SOLN
INTRAMUSCULAR | Status: DC | PRN
  Administered 2013-06-04: 4 mg via INTRAVENOUS

## 2013-06-04 MED ORDER — GLYCOPYRROLATE 0.2 MG/ML IJ SOLN
INTRAMUSCULAR | Status: DC | PRN
  Administered 2013-06-04: 0.6 mg via INTRAVENOUS

## 2013-06-04 MED ORDER — OXYCODONE HCL 5 MG/5ML PO SOLN: 5.0000 mg | Freq: Once | ORAL | Status: DC | PRN

## 2013-06-04 MED ORDER — CLINDAMYCIN PHOSPHATE 600 MG/50ML IV SOLN
600.0000 mg | Freq: Four times a day (QID) | INTRAVENOUS | Status: DC
  Administered 2013-06-04 – 2013-06-05 (×3): 600 mg via INTRAVENOUS
  Filled 2013-06-04 (×5): qty 50

## 2013-06-04 MED ORDER — LIDOCAINE HCL (CARDIAC) 20 MG/ML IV SOLN
INTRAVENOUS | Status: AC
  Filled 2013-06-04: qty 5

## 2013-06-04 MED ORDER — BACITRACIN ZINC 500 UNIT/GM EX OINT
TOPICAL_OINTMENT | CUTANEOUS | Status: AC
  Filled 2013-06-04: qty 15

## 2013-06-04 MED ORDER — NEOSTIGMINE METHYLSULFATE 1 MG/ML IJ SOLN
INTRAMUSCULAR | Status: DC | PRN
  Administered 2013-06-04: 5 mg via INTRAVENOUS

## 2013-06-04 MED ORDER — HYDROMORPHONE HCL PF 1 MG/ML IJ SOLN
0.2500 mg | INTRAMUSCULAR | Status: DC | PRN
  Administered 2013-06-04 (×2): 0.5 mg via INTRAVENOUS

## 2013-06-04 MED ORDER — SODIUM CHLORIDE 0.9 % IV BOLUS (SEPSIS)
1000.0000 mL | Freq: Once | INTRAVENOUS | Status: AC
  Administered 2013-06-04: 1000 mL via INTRAVENOUS

## 2013-06-04 MED ORDER — LIDOCAINE-EPINEPHRINE 1 %-1:100000 IJ SOLN
INTRAMUSCULAR | Status: DC | PRN
  Administered 2013-06-04: 10 mL

## 2013-06-04 MED ORDER — MORPHINE SULFATE 2 MG/ML IJ SOLN
2.0000 mg | INTRAMUSCULAR | Status: DC | PRN
  Administered 2013-06-04 – 2013-06-05 (×3): 2 mg via INTRAVENOUS
  Filled 2013-06-04 (×3): qty 1

## 2013-06-04 MED ORDER — ONDANSETRON HCL 4 MG/2ML IJ SOLN
INTRAMUSCULAR | Status: AC
  Filled 2013-06-04: qty 2

## 2013-06-04 MED ORDER — KCL IN DEXTROSE-NACL 10-5-0.45 MEQ/L-%-% IV SOLN
INTRAVENOUS | Status: DC
  Administered 2013-06-04 – 2013-06-05 (×2): via INTRAVENOUS
  Filled 2013-06-04 (×4): qty 1000

## 2013-06-04 MED ORDER — DOCUSATE SODIUM 100 MG PO CAPS
100.0000 mg | ORAL_CAPSULE | Freq: Two times a day (BID) | ORAL | Status: DC | PRN
  Filled 2013-06-04: qty 1

## 2013-06-04 MED ORDER — LIDOCAINE-EPINEPHRINE 2 %-1:100000 IJ SOLN
INTRAMUSCULAR | Status: AC
  Filled 2013-06-04: qty 1

## 2013-06-04 MED ORDER — HYDROCODONE-ACETAMINOPHEN 7.5-325 MG/15ML PO SOLN
15.0000 mL | ORAL | Status: DC | PRN
Start: 2013-06-04 — End: 2013-06-05

## 2013-06-04 MED ORDER — CHLORHEXIDINE GLUCONATE 0.12 % MT SOLN
15.0000 mL | Freq: Four times a day (QID) | OROMUCOSAL | Status: DC
  Administered 2013-06-04 – 2013-06-05 (×3): 15 mL via OROMUCOSAL
  Filled 2013-06-04 (×6): qty 15

## 2013-06-04 MED ORDER — HYDROMORPHONE HCL PF 1 MG/ML IJ SOLN
INTRAMUSCULAR | Status: AC
  Filled 2013-06-04: qty 1

## 2013-06-04 MED ORDER — DOXYCYCLINE HYCLATE 100 MG IV SOLR
100.0000 mg | Freq: Two times a day (BID) | INTRAVENOUS | Status: DC
  Administered 2013-06-05: 100 mg via INTRAVENOUS
  Filled 2013-06-04 (×3): qty 100

## 2013-06-04 MED ORDER — PROPOFOL 10 MG/ML IV BOLUS
INTRAVENOUS | Status: AC
  Filled 2013-06-04: qty 20

## 2013-06-04 MED ORDER — ARTIFICIAL TEARS OP OINT
TOPICAL_OINTMENT | OPHTHALMIC | Status: DC | PRN
  Administered 2013-06-04: 1 via OPHTHALMIC

## 2013-06-04 MED ORDER — MIDAZOLAM HCL 5 MG/5ML IJ SOLN
INTRAMUSCULAR | Status: DC | PRN
  Administered 2013-06-04: 2 mg via INTRAVENOUS

## 2013-06-04 MED ORDER — LACTINEX PO CHEW
1.0000 | CHEWABLE_TABLET | Freq: Three times a day (TID) | ORAL | Status: DC
  Administered 2013-06-04 – 2013-06-05 (×2): 1 via ORAL
  Filled 2013-06-04 (×5): qty 1

## 2013-06-04 MED ORDER — ONDANSETRON HCL 4 MG/2ML IJ SOLN
4.0000 mg | Freq: Once | INTRAMUSCULAR | Status: AC
  Administered 2013-06-04: 4 mg via INTRAVENOUS
  Filled 2013-06-04: qty 2

## 2013-06-04 MED ORDER — DIPHENHYDRAMINE HCL 50 MG/ML IJ SOLN: 12.5000 mg | Freq: Four times a day (QID) | INTRAMUSCULAR | Status: DC | PRN

## 2013-06-04 MED ORDER — 0.9 % SODIUM CHLORIDE (POUR BTL) OPTIME
TOPICAL | Status: DC | PRN
  Administered 2013-06-04: 150 mL

## 2013-06-04 MED ORDER — DEXAMETHASONE SODIUM PHOSPHATE 4 MG/ML IJ SOLN
10.0000 mg | Freq: Once | INTRAMUSCULAR | Status: AC
  Administered 2013-06-04: 10 mg via INTRAVENOUS
  Filled 2013-06-04: qty 3

## 2013-06-04 MED ORDER — ROCURONIUM BROMIDE 50 MG/5ML IV SOLN
INTRAVENOUS | Status: AC
  Filled 2013-06-04: qty 1

## 2013-06-04 MED ORDER — MORPHINE SULFATE 4 MG/ML IJ SOLN
4.0000 mg | Freq: Once | INTRAMUSCULAR | Status: AC
  Administered 2013-06-04: 4 mg via INTRAVENOUS
  Filled 2013-06-04: qty 1

## 2013-06-04 MED ORDER — NEOSTIGMINE METHYLSULFATE 1 MG/ML IJ SOLN
INTRAMUSCULAR | Status: AC
  Filled 2013-06-04: qty 10

## 2013-06-04 MED ORDER — LIDOCAINE HCL (CARDIAC) 20 MG/ML IV SOLN
INTRAVENOUS | Status: DC | PRN
  Administered 2013-06-04: 100 mg via INTRAVENOUS

## 2013-06-04 MED ORDER — ROCURONIUM BROMIDE 100 MG/10ML IV SOLN
INTRAVENOUS | Status: DC | PRN
  Administered 2013-06-04: 40 mg via INTRAVENOUS

## 2013-06-04 MED ORDER — GLYCOPYRROLATE 0.2 MG/ML IJ SOLN
INTRAMUSCULAR | Status: AC
  Filled 2013-06-04: qty 3

## 2013-06-04 MED ORDER — SUCCINYLCHOLINE CHLORIDE 20 MG/ML IJ SOLN
INTRAMUSCULAR | Status: AC
  Filled 2013-06-04: qty 1

## 2013-06-04 MED ORDER — MIDAZOLAM HCL 2 MG/2ML IJ SOLN
INTRAMUSCULAR | Status: AC
  Filled 2013-06-04: qty 2

## 2013-06-04 MED ORDER — DEXAMETHASONE SODIUM PHOSPHATE 10 MG/ML IJ SOLN
10.0000 mg | Freq: Three times a day (TID) | INTRAMUSCULAR | Status: DC
  Administered 2013-06-04 – 2013-06-05 (×2): 10 mg via INTRAVENOUS
  Filled 2013-06-04 (×5): qty 1

## 2013-06-04 MED ORDER — LACTATED RINGERS IV SOLN
INTRAVENOUS | Status: DC | PRN
  Administered 2013-06-04 (×2): via INTRAVENOUS

## 2013-06-04 MED ORDER — ONDANSETRON HCL 4 MG/2ML IJ SOLN: 4.0000 mg | Freq: Four times a day (QID) | INTRAMUSCULAR | Status: DC | PRN

## 2013-06-04 MED ORDER — FENTANYL CITRATE 0.05 MG/ML IJ SOLN
INTRAMUSCULAR | Status: DC | PRN
  Administered 2013-06-04 (×2): 50 ug via INTRAVENOUS
  Administered 2013-06-04: 150 ug via INTRAVENOUS

## 2013-06-04 MED ORDER — OXYMETAZOLINE HCL 0.05 % NA SOLN
NASAL | Status: AC
  Filled 2013-06-04: qty 15

## 2013-06-04 MED ORDER — ACETAMINOPHEN 160 MG/5ML PO SOLN: 325.0000 mg | ORAL | Status: DC | PRN

## 2013-06-04 MED ORDER — DIPHENHYDRAMINE HCL 12.5 MG/5ML PO ELIX: 12.5000 mg | ORAL_SOLUTION | Freq: Four times a day (QID) | ORAL | Status: DC | PRN

## 2013-06-04 MED ORDER — OXYCODONE HCL 5 MG PO TABS: 5.0000 mg | ORAL_TABLET | Freq: Once | ORAL | Status: DC | PRN

## 2013-06-04 MED ORDER — PROPOFOL 10 MG/ML IV BOLUS
INTRAVENOUS | Status: DC | PRN
  Administered 2013-06-04: 200 mg via INTRAVENOUS

## 2013-06-04 MED ORDER — DEXAMETHASONE SODIUM PHOSPHATE 4 MG/ML IJ SOLN
INTRAMUSCULAR | Status: AC
  Filled 2013-06-04: qty 2

## 2013-06-04 SURGICAL SUPPLY — 70 items
AIRSTRIP 4 3/4X3 1/4 7185 (GAUZE/BANDAGES/DRESSINGS) IMPLANT
APL SKNCLS STERI-STRIP NONHPOA (GAUZE/BANDAGES/DRESSINGS)
ATTRACTOMAT 16X20 MAGNETIC DRP (DRAPES) IMPLANT
BANDAGE CONFORM 2  STR LF (GAUZE/BANDAGES/DRESSINGS) IMPLANT
BANDAGE GAUZE ELAST BULKY 4 IN (GAUZE/BANDAGES/DRESSINGS) IMPLANT
BENZOIN TINCTURE PRP APPL 2/3 (GAUZE/BANDAGES/DRESSINGS) IMPLANT
BLADE SURG 10 STRL SS (BLADE) IMPLANT
BLADE SURG 15 STRL LF DISP TIS (BLADE) IMPLANT
BLADE SURG 15 STRL SS (BLADE)
BLADE SURG ROTATE 9660 (MISCELLANEOUS) IMPLANT
CANISTER SUCTION 2500CC (MISCELLANEOUS) ×3 IMPLANT
CATH ROBINSON RED A/P 10FR (CATHETERS) ×3 IMPLANT
CATH ROBINSON RED A/P 16FR (CATHETERS) IMPLANT
CLEANER TIP ELECTROSURG 2X2 (MISCELLANEOUS) ×3 IMPLANT
CLOSURE WOUND 1/2 X4 (GAUZE/BANDAGES/DRESSINGS)
CLOTH BEACON ORANGE TIMEOUT ST (SAFETY) ×3 IMPLANT
COAGULATOR SUCT 6 FR SWTCH (ELECTROSURGICAL) ×1
COAGULATOR SUCT SWTCH 10FR 6 (ELECTROSURGICAL) ×2 IMPLANT
CONT SPEC 4OZ CLIKSEAL STRL BL (MISCELLANEOUS) ×1 IMPLANT
COVER SURGICAL LIGHT HANDLE (MISCELLANEOUS) ×3 IMPLANT
CRADLE DONUT ADULT HEAD (MISCELLANEOUS) IMPLANT
DECANTER SPIKE VIAL GLASS SM (MISCELLANEOUS) ×1 IMPLANT
DRAIN PENROSE 1/4X12 LTX STRL (WOUND CARE) IMPLANT
DRAPE LAPAROTOMY TRNSV 102X78 (DRAPE) IMPLANT
DRSG EMULSION OIL 3X3 NADH (GAUZE/BANDAGES/DRESSINGS) IMPLANT
ELECT COATED BLADE 2.86 ST (ELECTRODE) ×3 IMPLANT
ELECT NDL TIP 2.8 STRL (NEEDLE) IMPLANT
ELECT NEEDLE TIP 2.8 STRL (NEEDLE) IMPLANT
ELECT REM PT RETURN 9FT ADLT (ELECTROSURGICAL) ×3
ELECT REM PT RETURN 9FT PED (ELECTROSURGICAL)
ELECTRODE REM PT RETRN 9FT PED (ELECTROSURGICAL) IMPLANT
ELECTRODE REM PT RTRN 9FT ADLT (ELECTROSURGICAL) ×1 IMPLANT
GAUZE SPONGE 4X4 16PLY XRAY LF (GAUZE/BANDAGES/DRESSINGS) ×3 IMPLANT
GLOVE BIO SURGEON STRL SZ7 (GLOVE) ×3 IMPLANT
GLOVE BIOGEL PI IND STRL 7.5 (GLOVE) ×1 IMPLANT
GLOVE BIOGEL PI INDICATOR 7.5 (GLOVE) ×2
GLOVE SURG SS PI 7.5 STRL IVOR (GLOVE) ×3 IMPLANT
GOWN STRL NON-REIN LRG LVL3 (GOWN DISPOSABLE) ×6 IMPLANT
KIT BASIN OR (CUSTOM PROCEDURE TRAY) ×3 IMPLANT
KIT ROOM TURNOVER OR (KITS) ×3 IMPLANT
MARKER SKIN DUAL TIP RULER LAB (MISCELLANEOUS) ×1 IMPLANT
NDL HYPO 25GX1X1/2 BEV (NEEDLE) ×1 IMPLANT
NEEDLE HYPO 25GX1X1/2 BEV (NEEDLE) ×3 IMPLANT
NS IRRIG 1000ML POUR BTL (IV SOLUTION) ×3 IMPLANT
PACK SURGICAL SETUP 50X90 (CUSTOM PROCEDURE TRAY) ×3 IMPLANT
PAD ARMBOARD 7.5X6 YLW CONV (MISCELLANEOUS) ×4 IMPLANT
PENCIL BUTTON HOLSTER BLD 10FT (ELECTRODE) ×3 IMPLANT
SPONGE GAUZE 4X4 12PLY (GAUZE/BANDAGES/DRESSINGS) IMPLANT
SPONGE LAP 18X18 X RAY DECT (DISPOSABLE) ×1 IMPLANT
SPONGE TONSIL 1 RF SGL (DISPOSABLE) ×3 IMPLANT
STRIP CLOSURE SKIN 1/2X4 (GAUZE/BANDAGES/DRESSINGS) ×1 IMPLANT
SUCTION FRAZIER TIP 10 FR DISP (SUCTIONS) ×1 IMPLANT
SUT MNCRL AB 4-0 PS2 18 (SUTURE) ×1 IMPLANT
SUT VIC AB 2-0 SH 27 (SUTURE) ×3
SUT VIC AB 2-0 SH 27X BRD (SUTURE) ×1 IMPLANT
SUT VIC AB 3-0 SH 27 (SUTURE)
SUT VIC AB 3-0 SH 27XBRD (SUTURE) ×1 IMPLANT
SWAB COLLECTION DEVICE MRSA (MISCELLANEOUS) IMPLANT
SYR BULB 3OZ (MISCELLANEOUS) ×1 IMPLANT
SYR BULB IRRIGATION 50ML (SYRINGE) ×1 IMPLANT
SYR CONTROL 10ML LL (SYRINGE) ×1 IMPLANT
TOWEL OR 17X24 6PK STRL BLUE (TOWEL DISPOSABLE) ×6 IMPLANT
TOWEL OR 17X26 10 PK STRL BLUE (TOWEL DISPOSABLE) ×3 IMPLANT
TRAY ENT MC OR (CUSTOM PROCEDURE TRAY) ×3 IMPLANT
TUBE ANAEROBIC SPECIMEN COL (MISCELLANEOUS) IMPLANT
TUBE CONNECTING 12'X1/4 (SUCTIONS)
TUBE CONNECTING 12X1/4 (SUCTIONS) ×1 IMPLANT
TUBE SALEM SUMP 12R W/ARV (TUBING) ×3 IMPLANT
WATER STERILE IRR 1000ML POUR (IV SOLUTION) ×1 IMPLANT
YANKAUER SUCT BULB TIP NO VENT (SUCTIONS) ×3 IMPLANT

## 2013-06-04 NOTE — Discharge Summary (Signed)
06/05/2013  9:45 AM Date of Admission:06/04/2013 Date of Discharge:06/05/2013  Discharge ZO:XWRUD:Slade Pierpoint, Clovis RileyMitchell, MD  Admitting EA:VWUJD:Savon Bordonaro, Clovis RileyMitchell, MD  Reason for admission/final discharge diagnosis: retropharyngeal abscess/acute adenoiditis  Labs:see EPIC  Procedure(s) performed:adenoidectomy, primary, >23 years old [id number], 10060, incision and drainage of retropharyngeal abscess  Discharge Condition:improved  Discharge Exam: oral cavity  And nasal cavity hemostatic, oropharyngeal swelling improved, taking good PO  Discharge Instructions: No heavy lifting >25lbs for one week, may use OTC nasal saline spray as needed for nasal congestion, post-tonsillectomy/soft diet as tolerated, Rx on chart for hydrocodone/acetaminophen and Rx for prednisolone taper, clindamycin, and zofran sent to [facility] in [place], [place], follow up with  [facility]ore [facility] in 3-4 weeks  Hospital Course: taken to OR for adenoidectomy and I&D of retropharyngeal abscess, did well post-op, improved on IV decadron, clindamycin, and doxycycline, discharged on POD#1  Melvenia BeamGore, Lealer Marsland 06/05/2013 9:47 AM

## 2013-06-04 NOTE — Anesthesia Postprocedure Evaluation (Signed)
  Anesthesia Post-op Note  Patient: Kimberly May  Procedure(s) Performed: Procedure(s): INCISION AND DRAINAGE PHARYNGEAL ABSCESS (N/A)  Patient Location: PACU  Anesthesia Type:General  Level of Consciousness: awake  Airway and Oxygen Therapy: Patient Spontanous Breathing  Post-op Pain: mild  Post-op Assessment: Post-op Vital signs reviewed, Patient's Cardiovascular Status Stable, Respiratory Function Stable, Patent Airway, No signs of Nausea or vomiting and Pain level controlled  Post-op Vital Signs: Reviewed and stable  Complications: No apparent anesthesia complications

## 2013-06-04 NOTE — ED Notes (Signed)
OR called to state that they are ready for pt in Short Stay. Pt transported by EMT.

## 2013-06-04 NOTE — Transfer of Care (Signed)
Immediate Anesthesia Transfer of Care Note  Patient: Kimberly May  Procedure(s) Performed: Procedure(s): INCISION AND DRAINAGE PHARYNGEAL ABSCESS (N/A)  Patient Location: PACU  Anesthesia Type:General  Level of Consciousness: awake, alert , oriented and patient cooperative  Airway & Oxygen Therapy: Patient Spontanous Breathing and Patient connected to face mask oxygen  Post-op Assessment: Report given to PACU RN, Post -op Vital signs reviewed and stable and Patient moving all extremities X 4  Post vital signs: Reviewed and stable  Complications: No apparent anesthesia complications

## 2013-06-04 NOTE — ED Provider Notes (Addendum)
CSN: [id number]     Arrival date & time 06/04/13  4098 History  This chart was scribed for Glynn Octave, MD by Quintella Reichert, ED scribe.  This patient was seen in room APA03/APA03 and the patient's care was started at 8:02 AM.   Chief Complaint  Patient presents with  . Sore Throat    The history is provided by the patient. No language interpreter was used.    HPI Comments: Kimberly May is a 23 y.o. female who presents to the Emergency Department complaining of a severe sore throat that began yesterday.  Pt was seen here 9 days ago for 2 days of sore throat and diagnosed with strep throat based on positive strep screen.  She was placed on a 4-day course of zithromax which she took completely.  She states that her sore throat initially improved but returned yesterday.  She reports severe pain with swallowing and states she has been unable to eat.  She states her pain is worse on the left side and she feels as if she has a "knot" in her throat.  She also complains of associated headache and fever up to 100 F.  She also notes some central CP on breathing.  She denies cough, vomiting, abdominal pain, ear pain, or dental pain.  She has not been spitting out pus.  Pt denies h/o DM.    Past Medical History  Diagnosis Date  . Tuberculosis 15 YRS AGO     FATHER HAD TB , PT WAS MED TX    Past Surgical History  Procedure Laterality Date  . Tympanostomy tube placement  DONE TWICE     BILAT    [facility])  . Cesarean section  2009   . Tonsillectomy  04/10/2011    Procedure: TONSILLECTOMY;  Surgeon: Antony Contras;  Location: [facility] OR;  Service: ENT;  Laterality: Bilateral;     History reviewed. No pertinent family history.   History  Substance Use Topics  . Smoking status: Never Smoker   . Smokeless tobacco: Not on file  . Alcohol Use: No    OB History   Grav Para Term Preterm Abortions TAB SAB Ect Mult Living                  Review of Systems A complete 10 system review  of systems was obtained and all systems are negative except as noted in the HPI and PMH.    Allergies  Penicillins  Home Medications   Current Outpatient Rx  Name  Route  Sig  Dispense  Refill  . DM-Doxylamine-Acetaminophen (NYQUIL COLD & FLU PO)   Oral   Take 30 mLs by mouth every 6 (six) hours as needed (cough/cold/congestion).         Marland Kitchen etonogestrel (IMPLANON) 68 MG IMPL implant   Subcutaneous   Inject 1 each into the skin once.           Marland Kitchen ibuprofen (ADVIL,MOTRIN) 200 MG tablet   Oral   Take 400 mg by mouth every 6 (six) hours as needed.         Marland Kitchen Phenylephrine-APAP-Guaifenesin (MUCINEX FAST-MAX COLD & SINUS PO)   Oral   Take 20 mLs by mouth every 4 (four) hours as needed (cough/cold/congestion).          BP 109/55  Pulse 108  Temp(Src) 99.8 F (37.7 C) (Oral)  Resp 20  SpO2 97%  Physical Exam  Nursing note and vitals reviewed. Constitutional: She is oriented to  person, place, and time. She appears well-developed and well-nourished. No distress.  HENT:  Head: Normocephalic and atraumatic.  Mouth/Throat: Uvula swelling present. Oropharyngeal exudate and posterior oropharyngeal erythema present.  Hot potato voice Purulence in posterior pharynx, with swollen uvula Erythema of bilateral pharyngeal arches, left greater than right Floor of mouth is soft  Eyes: Conjunctivae and EOM are normal. Pupils are equal, round, and reactive to light.  Neck: Normal range of motion. Neck supple. No tracheal deviation present.  No meningismus  Cardiovascular: Normal rate.   No murmur heard. tachycardic  Pulmonary/Chest: Effort normal and breath sounds normal. No respiratory distress.  Abdominal: Soft. There is no tenderness. There is no rebound and no guarding.  Musculoskeletal: Normal range of motion. She exhibits no edema and no tenderness.  Lymphadenopathy:    She has cervical adenopathy.  Anterior cervical lymphadenopathy  Neurological: She is alert and oriented to  person, place, and time.  Skin: Skin is warm and dry.  Psychiatric: She has a normal mood and affect. Her behavior is normal.    ED Course  Procedures (including critical care time)  DIAGNOSTIC STUDIES: Oxygen Saturation is 97% on room air, normal by my interpretation.    COORDINATION OF CARE: 8:09 AM-Discussed treatment plan which includes labs, IV fluids, pain medication, and imaging with pt at bedside and pt agreed to plan.   9:28 AM-Informed pt that CT-scan reveals symptoms are likely due to a severe infection.  Discussed treatment plan which includes likely admission with pt at bedside and pt agreed to plan.   10:11 AM-Consult complete with Dr. Emeline Darling, ENT. Patient case explained and discussed. He agrees to admit patient to [facility] Short Stay for further evaluation and treatment.     Labs Review Labs Reviewed  CBC WITH DIFFERENTIAL - Abnormal; Notable for the following:    WBC 17.0 (*)    Neutrophils Relative % 83 (*)    Neutro Abs 14.2 (*)    Lymphocytes Relative 9 (*)    Monocytes Absolute 1.3 (*)    All other components within normal limits  COMPREHENSIVE METABOLIC PANEL - Abnormal; Notable for the following:    Sodium 135 (*)    Glucose, Bld 122 (*)    BUN 31 (*)    Creatinine, Ser 1.46 (*)    Total Protein 8.5 (*)    Albumin 3.3 (*)    GFR calc non Af Amer 50 (*)    GFR calc Af Amer 58 (*)    All other components within normal limits  MONONUCLEOSIS SCREEN - Abnormal; Notable for the following:    Mono Screen NEGATIVE (*)    All other components within normal limits  DIPHTHERIA / TETANUS ANTIBODY PANEL    Imaging Review Dg Chest 2 View  06/04/2013   CLINICAL DATA:  Two-day history of cough  EXAM: CHEST  2 VIEW  COMPARISON:  None.  FINDINGS: Borderline cardiomegaly. Mediastinal contours within normal limits. Mild central airway thickening/ peribronchial cuffing. No focal airspace consolidation, pleural effusion or pneumothorax. No pulmonary edema. No acute  osseous abnormality.  IMPRESSION: 1. Mild central airway thickening/peribronchial cuffing as can be seen and bronchitis, viral respiratory infection an inflammatory conditions such as asthma. 2. Borderline cardiomegaly. This is favored to represent prominence of the pericardial fat.   Electronically Signed   By: Malachy Moan M.D.   On: 06/04/2013 09:20   Ct Soft Tissue Neck W Contrast  06/04/2013   ADDENDUM REPORT: 06/04/2013 09:43  ADDENDUM: To be clear, I suspect  the primary inflammation started in the left palatine tonsil and spread to the lateral retropharyngeal nodes (more notable on the left as discussed in this report) with subsequent seeding of the retropharyngeal space.  Atypical infection not excluded.   Electronically Signed   By: Bridgett Larsson M.D.   On: 06/04/2013 09:43   06/04/2013   CLINICAL DATA:  Throat pain left side for a few days.  EXAM: CT NECK WITH CONTRAST  TECHNIQUE: Multidetector CT imaging of the neck was performed using the standard protocol following the bolus administration of intravenous contrast.  CONTRAST:  80mL OMNIPAQUE IOHEXOL 300 MG/ML  SOLN  COMPARISON:  None.  FINDINGS: Diffuse inflammation of the palatine tonsillar greater on the left. The palatini tonsils appear hypodense particularly on the left but without well-defined drainable abscess currently detected at this level. Prominence of Waldeyer's ring diffusely.  Markedly enlarged lateral retropharyngeal node (node of Rouviere). The left lateral retropharyngeal node is not only enlarged but diffusely inflamed with central hypodensity suggesting central abscess/ adenitis. I suspect this is seeding the retropharyngeal space with there is a fluid collection which spans from C1-C7 with maximal transverse dimension of 1.2 x 4 cm. This retropharyngeal fluid collection has an appearance more suggestive of phlegmon rather than a well-defined bulging abscess. The patient is at risk for extension of inflammatory process into the  danger space (which can extend into the mediastinum). There is a small amount of pericardial fluid but without other findings to suggest mediastinal inflammation.  Diffuse neck adenopathy involving multiple stations, largest left level 2 region with index lymph node having transverse dimension of 1.9 x 1.5 cm.  Currently, no obvious epidural extension of infection. Cervical spondylotic changes C4-5. Reversal of the normal cervical lordosis. This may be related to splinting from the infectious process. If the patient developed any neck pain indicating possible epidural spread of infection, contrast enhanced cervical spine MR recommended.  The internal jugular veins are patent bilaterally without findings of septic thrombophlebitis.  Opacification left mastoid air cells and partial opacification left middle ear cavity may reflect changes of poor drainage of the left eustachian tube caused by the inflammatory process. Primary mastoid/middle ear abnormality not excluded and can be assessed after present acute episode is cleared.  Polypoid opacification maxillary sinuses with moderate mucosal thickening left sphenoid sinus and mild mucosal thickening right sphenoid sinus.  Visualized intracranial structures and orbital structures are unremarkable.  Visualized lung apices are clear.  IMPRESSION: Diffuse inflammation of the palatine tonsillar greater on the left.  Markedly enlarged lateral retropharyngeal node (node of Rouviere). The left lateral retropharyngeal node is not only enlarged but diffusely inflamed with central hypodensity suggesting central abscess/ adenitis. I suspect this is seeding the retropharyngeal space with there is a fluid collection which spans from C1-C7 with maximal transverse dimension of 1.2 x 4 cm. This retropharyngeal fluid collection has an appearance more suggestive of phlegmon rather than a well-defined bulging abscess. The patient is at risk for extension of inflammatory process into the  danger space (which can extend into the mediastinum).  Diffuse neck adenopathy.  Currently, no obvious epidural extension of infection. Follow-up as noted above.  Opacification left mastoid air cells and partial opacification left middle ear cavity may reflect changes of poor drainage of the left eustachian tube caused by the inflammatory process. Primary mastoid/middle ear abnormality not excluded and can be assessed after present acute episode is cleared.  Polypoid opacification maxillary sinuses with moderate mucosal thickening left sphenoid sinus and mild mucosal  thickening right sphenoid sinus.  These results were called by telephone at the time of interpretation on 06/04/2013 at 9:11 AM to Dr. Glynn OctaveSTEPHEN Sharese Manrique , who verbally acknowledged these results. ENT consultation recommended.  Electronically Signed: By: Bridgett LarssonSteve  Olson M.D. On: 06/04/2013 09:30    EKG Interpretation   None       MDM   1. Retropharyngeal abscess    Treated for strep on January 14 with Zithromax. Pain in her throat is returned 2 days ago with pain with swallowing. No fever.  Patient has a hot potato voice, she has purulence and Exudate around her uvula and posterior oropharynx. Slight L sided asymmetry. Diptheria considered. Seems unlikely as patient is full immunized. No obvious pseudomembranes.  CT findings discussed with Dr. Constance Goltzlson in radiology and Dr. Emeline DarlingGore of ENT. Patient seems to have retropharyngeal fluid collections seeding from left retropharyngeal lymph node. This is at risk for extension into the mediastinum.  Dr. Emeline DarlingGore will evaluate the patient and at the [facility] ED. Dr. Anitra LauthPlunkett accepts patient to ED.   I personally performed the services described in this documentation, which was scribed in my presence. The recorded information has been reviewed and is accurate.    Glynn OctaveStephen Will Schier, MD 06/04/13 1127  Glynn OctaveStephen Carnella Fryman, MD 06/04/13 [phone]

## 2013-06-04 NOTE — ED Notes (Signed)
[facility].  Will send truck ASAP.

## 2013-06-04 NOTE — H&P (Signed)
06/04/2013  Kimberly May, Maliya Marich  PREOPERATIVE HISTORY AND PHYSICAL  CHIEF COMPLAINT: possible retropharyngeal abscess/acute pharyngitis/adenoiditis  HISTORY: This is a 23 year old s/p tonsillectomy and right T-tube by Dr. Jenne PaneBates in 2012 who presents with possible retropharyngeal abscess/acute pharyngitis/adenoiditis. She now presents for adenoidectomy and possible incision and drainage of retropharyngeal abscess.  Dr. Emeline DarlingGore, Clovis RileyMitchell has discussed the risks (bleeding, infection, risks of general anesthesia, scarring, pain, etc.), benefits, and alternatives of this procedure. The patient understands the risks and would like to proceed with the procedure. The chances of success of the procedure are >50% and the patient understands this. I personally performed an examination of the patient within 24 hours of the procedure.  PAST MEDICAL HISTORY: Past Medical History  Diagnosis Date  . Tuberculosis 15 YRS AGO     FATHER HAD TB , PT WAS MED TX    PAST SURGICAL HISTORY: Past Surgical History  Procedure Laterality Date  . Tympanostomy tube placement  DONE TWICE     [facility]AT    [facility]  . Cesarean section  2009   . Tonsillectomy  04/10/2011    Procedure: TONSILLECTOMY;  Surgeon: Antony Contraswight D Bates;  Location: [facility] OR;  Service: ENT;  Laterality: Bilateral;    MEDICATIONS: No current facility-administered medications on file prior to encounter.   Current Outpatient Prescriptions on File Prior to Encounter  Medication Sig Dispense Refill  . etonogestrel (IMPLANON) 68 MG IMPL implant Inject 1 each into the skin once.          ALLERGIES: Allergies  Allergen Reactions  . Penicillins Rash      SOCIAL HISTORY: History   Social History  . Marital Status: Single    Spouse Name: N/A    Number of Children: N/A  . Years of Education: N/A   Occupational History  . Not on file.   Social History Main Topics  . Smoking status: Never Smoker   . Smokeless tobacco: Not on file  . Alcohol Use: No   . Drug Use: No  . Sexual Activity: Yes    Birth Control/ Protection: Implant   Other Topics Concern  . Not on file   Social History Narrative  . No narrative on file    FAMILY HISTORY:History reviewed. No pertinent family history.  REVIEW OF SYSTEMS:  HEENT:sore throat, dysphagia, odynophagia, adenopathy, fever, otherwise negative x 12 systems except per HPI  PHYSICAL EXAM:  GENERAL:  NAD, appears uncomfortable VITAL SIGNS:   Filed Vitals:   06/04/13 1141  BP: 114/70  Pulse: 116  Temp: 98.4 F (36.9 C)  Resp: 16   SKIN:  Warm, dry HEENT:  mallampatti 2/3, tonsils surgically absent with posterior pharyngeal erythema and crusting. NECK:  Trachea midline LYMPH:  Some bilateral reactive cervical adenopathy, no fluctuance LUNGS:  Grossly clear CARDIOVASCULAR:  RRR ABDOMEN:  Soft, NT MUSCULOSKELETAL: normal strength PSYCH:  Normal affect NEUROLOGIC:  Cn 2-12 intact and symmetric  DIAGNOSTIC STUDIES: CT shows possible retropharyngeal phlegmon with reactive adenopathy and adenoid hypertrophy/adenoiditis with sclerotic mastoids and left chronic maxillary sinusitis  ASSESSMENT AND PLAN: Plan to proceed with adenoidectomy and incision and drainage of retropharyngeal abscess. Patient understands the risks, benefits, and alternatives. Informed written consent signed, witnessed and on chart. Will culture to direct antibiotics and rule out diphtheria. 06/04/2013  11:52 AM Kimberly May, Xoe Hoe

## 2013-06-04 NOTE — ED Notes (Signed)
Report called to charge nurse at [facility]. Pt informed and verbalized understanding that she will be [facility] to [facility] evaluation by E[facility] team.

## 2013-06-04 NOTE — ED Notes (Signed)
Report received from EMS pt. Here for consult for left side pharangeal abscess. Vital signs en route BP 107/98, SpO2 96%/RA, Respirations 18. Airway intact.

## 2013-06-04 NOTE — Op Note (Signed)
DATE OF OPERATION: 06/04/2013 Surgeon: Melvenia BeamGore, Kiarrah Rausch Procedure Performed: [id number] primary adenoidectomy >23 years old 10060-incision and drainage of retropharyngeal phelgmon/abscess bilateral tonsillectomy with adenoidectomy  PREOPERATIVE DIAGNOSIS: adenoid hypertrophy, acute adenoiditis, acute pharyngitis, retropharyngeal phlegmon   POSTOPERATIVE DIAGNOSIS: adenoid hypertrophy, acute adenoiditis, acute pharyngitis, retropharyngeal phlegmon  SURGEON: Melvenia BeamGore, Nickoles Gregori ANESTHESIA: General endotracheal.  ESTIMATED BLOOD LOSS: less than 50 mL.  DRAINS: none SPECIMENS: adenoid/retropharyngeal culture INDICATIONS: The patient is a 22yo with a history of adenoid hypertrophy, acute adenoiditis, acute pharyngitis, retropharyngeal phlegmon  DESCRIPTION OF OPERATION: The patient was brought to the operating room and was placed in the supine position and was placed under general endotracheal anesthesia by anesthesiology. The bed was turned 90 degrees and the Crowe-Davis mouth retractor was placed over the endotracheal tube and suspended from the Mayo stand. The palate was inspected and palpated and noted to be intact with no submucous cleft. The uvula and palate and posterior pharyngeal wall were moderately edematous with some purulent fluid on the mucosa that I cultured. I used an 18 gauge needle and 10mL syring to aspirate the retropharyngeal space in several locations laterally and medially, and only some minimal dishwater/phelgmon was aspirated but no gross purulence.  The adenoids were inspected with a dental mirror and noted to be hypertrophic and inflammed/obstructive. The adenoids were removed with the suction Bovie taking care to leave a rim of adenoid tissue inferiorly near the palate to prevent velopharyngeal insufficiency and care was taken not to injure the eustachian tube orifice. Once hemostasis was obtained on the posterior pharyngeal wall and adenoids the nasal cavity and oropharynx were irrigated  out and then the the nose, oral cavity,  and stomach were suctioned out. The patient was turned back to anesthesia and awakened from anesthesia and extubated without difficulty. The patient tolerated the procedure well with no immediate complications and was taken to the postoperative recovery area in good condition.   Dr. Melvenia BeamMitchell Rayn Enderson was present and performed the entire procedure. 06/04/2013  1:20 PM Melvenia BeamGore, Amna Welker

## 2013-06-04 NOTE — ED Notes (Signed)
Pt c/o sore throat since yesterday. Pt states she was seen here last week and dx with strep throat. Pt was given abx for home and states symptoms initially improved but have now returned. Pt states "I feel like something is in my throat and it's hard to swallow".

## 2013-06-04 NOTE — Preoperative (Signed)
Beta Blockers   Reason not to administer Beta Blockers:Not Applicable 

## 2013-06-04 NOTE — Progress Notes (Signed)
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  @LABLAST2 (wbc:2,hgb:2,hct:2,plt:2)  Recent Labs  06/04/13 0820  NA 135*  K 4.2  CL 97  CO2 24  GLUCOSE 122*  BUN 31*  CREATININE 1.46*  CALCIUM 9.0    Medications:  No current facility-administered medications on file prior to encounter.   Current Outpatient Prescriptions on File Prior to Encounter  Medication Sig Dispense Refill  . etonogestrel (IMPLANON) 68 MG IMPL implant Inject 1 each into the skin once.          Assessment/Plan: Doing well s/p adenoidectomy and needle drainage of retropharyngeal phlegmon. Clindamycin/doxycycline/decadron and monitor. Can go home when taking good PO and clinically improved. Will follow up cultures.   LOS: 0 days   Melvenia BeamGore, Layla Kesling 06/04/2013, 1:41 PM

## 2013-06-04 NOTE — Anesthesia Preprocedure Evaluation (Signed)
Anesthesia Evaluation  Patient identified by MRN, date of birth, ID band Patient awake    Reviewed: Allergy & Precautions, H&P , NPO status , Patient's Chart, lab work & pertinent test results  Airway Mallampati: II TM Distance: <3 FB Neck ROM: Limited    Dental  (+) Teeth Intact and Dental Advisory Given   Pulmonary  breath sounds clear to auscultation        Cardiovascular Rhythm:Regular Rate:Tachycardia     Neuro/Psych    GI/Hepatic   Endo/Other  Morbid obesity  Renal/GU      Musculoskeletal   Abdominal (+) + obese,   Peds  Hematology   Anesthesia Other Findings   Reproductive/Obstetrics                           Anesthesia Physical Anesthesia Plan  ASA: III and emergent  Anesthesia Plan: General   Post-op Pain Management:    Induction: Intravenous  Airway Management Planned: Oral ETT and Video Laryngoscope Planned  Additional Equipment:   Intra-op Plan:   Post-operative Plan: Extubation in OR  Informed Consent: I have reviewed the patients History and Physical, chart, labs and discussed the procedure including the risks, benefits and alternatives for the proposed anesthesia with the patient or authorized representative who has indicated his/her understanding and acceptance.     Plan Discussed with: CRNA and Surgeon  Anesthesia Plan Comments:         Anesthesia Quick Evaluation

## 2013-06-05 LAB — CBC
HCT: 32.2 % — ABNORMAL LOW (ref 36.0–46.0)
HEMOGLOBIN: 10.9 g/dL — AB (ref 12.0–15.0)
MCH: 27.7 pg (ref 26.0–34.0)
MCHC: 33.9 g/dL (ref 30.0–36.0)
MCV: 81.9 fL (ref 78.0–100.0)
Platelets: 173 10*3/uL (ref 150–400)
RBC: 3.93 MIL/uL (ref 3.87–5.11)
RDW: 13.1 % (ref 11.5–15.5)
WBC: 19.6 10*3/uL — ABNORMAL HIGH (ref 4.0–10.5)

## 2013-06-05 LAB — COMPREHENSIVE METABOLIC PANEL
ALBUMIN: 2.6 g/dL — AB (ref 3.5–5.2)
ALT: 12 U/L (ref 0–35)
AST: 11 U/L (ref 0–37)
Alkaline Phosphatase: 73 U/L (ref 39–117)
BUN: 28 mg/dL — ABNORMAL HIGH (ref 6–23)
CO2: 22 mEq/L (ref 19–32)
Calcium: 8.4 mg/dL (ref 8.4–10.5)
Chloride: 102 mEq/L (ref 96–112)
Creatinine, Ser: 0.9 mg/dL (ref 0.50–1.10)
GFR calc Af Amer: >90 mL/min (ref >90)
GFR calc non Af Amer: >90 mL/min (ref >90)
Glucose, Bld: 178 mg/dL — ABNORMAL HIGH (ref 70–99)
POTASSIUM: 4.8 meq/L (ref 3.7–5.3)
Sodium: 136 mEq/L — ABNORMAL LOW (ref 137–147)
Total Bilirubin: 0.2 mg/dL — ABNORMAL LOW (ref 0.3–1.2)
Total Protein: 7.6 g/dL (ref 6.0–8.3)

## 2013-06-05 LAB — PHOSPHORUS: Phosphorus: 3.5 mg/dL (ref 2.3–4.6)

## 2013-06-05 LAB — MAGNESIUM: MAGNESIUM: 2.3 mg/dL (ref 1.5–2.5)

## 2013-06-05 NOTE — Discharge Instructions (Signed)
Discharge Instructions: No heavy lifting >25lbs for one week, may use OTC nasal saline spray as needed for nasal congestion, post-tonsillectomy/soft diet as tolerated, Rx on chart for hydrocodone/acetaminophen and Rx for prednisolone taper, clindamycin, and zofran sent to [facility] in [place], [place], [facility]  [facility] 3[facility]4 weeks

## 2013-06-05 NOTE — Progress Notes (Addendum)
Subjective: POD#1 from adenoidectomy and needle drainage of retropharyngeal phlegmon. Patient complains of stuffy nose but feels much better subjectively and taking adequate PO.  Objective: Vital signs in last 24 hours: Temp:  [97.5 F (36.4 C)-99.8 F (37.7 C)] 97.8 F (36.6 C) (01/26 0607) Pulse Rate:  [81-116] 91 (01/26 0607) Resp:  [16-24] 20 (01/26 0607) BP: (108-124)/(55-77) 124/74 mmHg (01/26 0607) SpO2:  [92 %-99 %] 95 % (01/26 0607) Weight:  [94.348 kg (208 lb)] 94.348 kg (208 lb) (01/25 1450)  No epistaxis, CN 2-12 intact and symmetric, neck supple, throat with some minimal crusts but no purulence or edema. Normal voice.  @LABLAST2 (wbc:2,hgb:2,hct:2,plt:2)  Recent Labs  06/04/13 0820 06/05/13 0453  NA 135* 136*  K 4.2 4.8  CL 97 102  CO2 24 22  GLUCOSE 122* 178*  BUN 31* 28*  CREATININE 1.46* 0.90  CALCIUM 9.0 8.4    Medications:  Scheduled Meds: . chlorhexidine  15 mL Mouth/Throat QID  . clindamycin (CLEOCIN) IV  600 mg Intravenous Q6H  . dexamethasone  10 mg Intravenous Q8H  . doxycycline (VIBRAMYCIN) IV  100 mg Intravenous Q12H  . lactobacillus acidophilus & bulgar  1 tablet Oral TID WC   Continuous Infusions: . dextrose 5 % and 0.45 % NaCl with KCl 10 mEq/L 75 mL/hr at 06/05/13 0529   PRN Meds:.acetaminophen (TYLENOL) oral liquid 160 mg/5 mL, diphenhydrAMINE, diphenhydrAMINE, docusate sodium, HYDROcodone-acetaminophen, morphine injection, ondansetron  Assessment/Plan: Much better after adenoidectomy and needle drainage of retropharyngeal phlegmon. Can be discharged when she feels she is ready, I put prescription on chart for hydrocodone/acetaminophen elixir and I e-prescribed Rx for prednisolone taper, clindamycin liquid, and zofran to [facility] in [place], [place].    LOS: 1 day   Melvenia BeamGore, Shilynn Hoch 06/05/2013, 6:47 AM

## 2013-06-06 ENCOUNTER — Encounter (HOSPITAL_COMMUNITY): Payer: Self-pay | Admitting: Otolaryngology

## 2013-06-07 LAB — CULTURE, ROUTINE-ABSCESS

## 2013-06-07 LAB — DIPHTHERIA / TETANUS ANTIBODY PANEL
Diphtheria Ab: 0.19 IU/mL
TETANUS AB: 0.38 [IU]/mL (ref >0.15)

## 2013-06-09 ENCOUNTER — Inpatient Hospital Stay (HOSPITAL_COMMUNITY)
Admission: EM | Admit: 2013-06-09 | Discharge: 2013-06-26 | DRG: 003 | Disposition: A | Discharge status: Home Health | Payer: BC Managed Care – PPO | Attending: Otolaryngology | Admitting: Otolaryngology

## 2013-06-09 ENCOUNTER — Encounter (HOSPITAL_COMMUNITY): Payer: BC Managed Care – PPO | Admitting: Anesthesiology

## 2013-06-09 ENCOUNTER — Emergency Department (HOSPITAL_COMMUNITY): Payer: BC Managed Care – PPO

## 2013-06-09 ENCOUNTER — Encounter (HOSPITAL_COMMUNITY)
Admission: EM | Disposition: A | Discharge status: Home Health | Payer: Self-pay | Source: Home / Self Care | Attending: Otolaryngology

## 2013-06-09 ENCOUNTER — Emergency Department (HOSPITAL_COMMUNITY): Payer: BC Managed Care – PPO | Admitting: Anesthesiology

## 2013-06-09 ENCOUNTER — Encounter (HOSPITAL_COMMUNITY): Payer: Self-pay | Admitting: Emergency Medicine

## 2013-06-09 DIAGNOSIS — J390 Retropharyngeal and parapharyngeal abscess: Principal | ICD-10-CM | POA: Diagnosis present

## 2013-06-09 DIAGNOSIS — R6520 Severe sepsis without septic shock: Secondary | ICD-10-CM

## 2013-06-09 DIAGNOSIS — J90 Pleural effusion, not elsewhere classified: Secondary | ICD-10-CM | POA: Diagnosis present

## 2013-06-09 DIAGNOSIS — H1130 Conjunctival hemorrhage, unspecified eye: Secondary | ICD-10-CM | POA: Diagnosis not present

## 2013-06-09 DIAGNOSIS — E876 Hypokalemia: Secondary | ICD-10-CM | POA: Diagnosis not present

## 2013-06-09 DIAGNOSIS — R1313 Dysphagia, pharyngeal phase: Secondary | ICD-10-CM | POA: Diagnosis not present

## 2013-06-09 DIAGNOSIS — E669 Obesity, unspecified: Secondary | ICD-10-CM | POA: Diagnosis present

## 2013-06-09 DIAGNOSIS — B950 Streptococcus, group A, as the cause of diseases classified elsewhere: Secondary | ICD-10-CM | POA: Diagnosis present

## 2013-06-09 DIAGNOSIS — J9600 Acute respiratory failure, unspecified whether with hypoxia or hypercapnia: Secondary | ICD-10-CM

## 2013-06-09 DIAGNOSIS — Z6841 Body Mass Index (BMI) 40.0 and over, adult: Secondary | ICD-10-CM

## 2013-06-09 DIAGNOSIS — N179 Acute kidney failure, unspecified: Secondary | ICD-10-CM

## 2013-06-09 DIAGNOSIS — R0902 Hypoxemia: Secondary | ICD-10-CM

## 2013-06-09 DIAGNOSIS — R161 Splenomegaly, not elsewhere classified: Secondary | ICD-10-CM | POA: Diagnosis not present

## 2013-06-09 DIAGNOSIS — A419 Sepsis, unspecified organism: Secondary | ICD-10-CM

## 2013-06-09 DIAGNOSIS — Z930 Tracheostomy status: Secondary | ICD-10-CM

## 2013-06-09 DIAGNOSIS — D649 Anemia, unspecified: Secondary | ICD-10-CM | POA: Diagnosis not present

## 2013-06-09 DIAGNOSIS — R51 Headache: Secondary | ICD-10-CM | POA: Diagnosis not present

## 2013-06-09 DIAGNOSIS — R6521 Severe sepsis with septic shock: Secondary | ICD-10-CM

## 2013-06-09 DIAGNOSIS — H6980 Other specified disorders of Eustachian tube, unspecified ear: Secondary | ICD-10-CM

## 2013-06-09 DIAGNOSIS — J351 Hypertrophy of tonsils: Secondary | ICD-10-CM

## 2013-06-09 DIAGNOSIS — E875 Hyperkalemia: Secondary | ICD-10-CM | POA: Diagnosis not present

## 2013-06-09 HISTORY — PX: INCISION AND DRAINAGE OF PERITONSILLAR ABCESS: SHX6257

## 2013-06-09 LAB — CBC WITH DIFFERENTIAL/PLATELET
BASOS PCT: 0 % (ref 0–1)
Basophils Absolute: 0 10*3/uL (ref 0.0–0.1)
Eosinophils Absolute: 0 10*3/uL (ref 0.0–0.7)
Eosinophils Relative: 0 % (ref 0–5)
HEMATOCRIT: 32.1 % — AB (ref 36.0–46.0)
HEMOGLOBIN: 10.8 g/dL — AB (ref 12.0–15.0)
LYMPHS ABS: 1.9 10*3/uL (ref 0.7–4.0)
Lymphocytes Relative: 12 % (ref 12–46)
MCH: 27.8 pg (ref 26.0–34.0)
MCHC: 33.6 g/dL (ref 30.0–36.0)
MCV: 82.7 fL (ref 78.0–100.0)
MONOS PCT: 8 % (ref 3–12)
Monocytes Absolute: 1.3 10*3/uL — ABNORMAL HIGH (ref 0.1–1.0)
NEUTROS ABS: 13 10*3/uL — AB (ref 1.7–7.7)
Neutrophils Relative %: 80 % — ABNORMAL HIGH (ref 43–77)
Platelets: 213 10*3/uL (ref 150–400)
RBC: 3.88 MIL/uL (ref 3.87–5.11)
RDW: 12.8 % (ref 11.5–15.5)
WBC: 16.2 10*3/uL — AB (ref 4.0–10.5)

## 2013-06-09 LAB — COMPREHENSIVE METABOLIC PANEL
ALT: 15 U/L (ref 0–35)
AST: 9 U/L (ref 0–37)
Albumin: 2.5 g/dL — ABNORMAL LOW (ref 3.5–5.2)
Alkaline Phosphatase: 61 U/L (ref 39–117)
BILIRUBIN TOTAL: 0.4 mg/dL (ref 0.3–1.2)
BUN: 15 mg/dL (ref 6–23)
CALCIUM: 8.1 mg/dL — AB (ref 8.4–10.5)
CHLORIDE: 102 meq/L (ref 96–112)
CO2: 27 mEq/L (ref 19–32)
Creatinine, Ser: 0.65 mg/dL (ref 0.50–1.10)
Glucose, Bld: 85 mg/dL (ref 70–99)
Potassium: 4.3 mEq/L (ref 3.7–5.3)
Sodium: 140 mEq/L (ref 137–147)
Total Protein: 7.1 g/dL (ref 6.0–8.3)

## 2013-06-09 SURGERY — INCISION AND DRAINAGE, ABSCESS, PERITONSILLAR
Anesthesia: General | Site: Neck | Laterality: Left

## 2013-06-09 MED ORDER — CLINDAMYCIN PHOSPHATE 900 MG/50ML IV SOLN: 900.0000 mg | Freq: Once | INTRAVENOUS | Status: DC

## 2013-06-09 MED ORDER — HYDROMORPHONE HCL PF 1 MG/ML IJ SOLN
0.5000 mg | Freq: Once | INTRAMUSCULAR | Status: AC
  Administered 2013-06-09: 0.5 mg via INTRAVENOUS
  Filled 2013-06-09: qty 1

## 2013-06-09 MED ORDER — CLINDAMYCIN PHOSPHATE 900 MG/50ML IV SOLN
900.0000 mg | Freq: Once | INTRAVENOUS | Status: AC
  Administered 2013-06-10: 900 mg via INTRAVENOUS

## 2013-06-09 MED ORDER — ARTIFICIAL TEARS OP OINT
TOPICAL_OINTMENT | OPHTHALMIC | Status: AC
  Filled 2013-06-09: qty 3.5

## 2013-06-09 MED ORDER — ONDANSETRON HCL 4 MG/2ML IJ SOLN
INTRAMUSCULAR | Status: AC
  Filled 2013-06-09: qty 2

## 2013-06-09 MED ORDER — LIDOCAINE-EPINEPHRINE 0.5 %-1:200000 IJ SOLN
INTRAMUSCULAR | Status: AC
  Filled 2013-06-09: qty 1

## 2013-06-09 MED ORDER — LIDOCAINE HCL (CARDIAC) 20 MG/ML IV SOLN
INTRAVENOUS | Status: AC
  Filled 2013-06-09: qty 5

## 2013-06-09 MED ORDER — MIDAZOLAM HCL 2 MG/2ML IJ SOLN
INTRAMUSCULAR | Status: AC
  Filled 2013-06-09: qty 2

## 2013-06-09 MED ORDER — IOHEXOL 300 MG/ML  SOLN
75.0000 mL | Freq: Once | INTRAMUSCULAR | Status: AC | PRN
  Administered 2013-06-09: 75 mL via INTRAVENOUS

## 2013-06-09 MED ORDER — PHENYLEPHRINE 40 MCG/ML (10ML) SYRINGE FOR IV PUSH (FOR BLOOD PRESSURE SUPPORT)
PREFILLED_SYRINGE | INTRAVENOUS | Status: AC
  Filled 2013-06-09: qty 10

## 2013-06-09 MED ORDER — FENTANYL CITRATE 0.05 MG/ML IJ SOLN
INTRAMUSCULAR | Status: AC
  Filled 2013-06-09: qty 5

## 2013-06-09 MED ORDER — LACTATED RINGERS IV SOLN
INTRAVENOUS | Status: DC | PRN
  Administered 2013-06-09 – 2013-06-10 (×2): via INTRAVENOUS

## 2013-06-09 MED ORDER — SODIUM CHLORIDE 0.9 % IV BOLUS (SEPSIS)
1000.0000 mL | Freq: Once | INTRAVENOUS | Status: AC
  Administered 2013-06-09: 1000 mL via INTRAVENOUS

## 2013-06-09 MED ORDER — SUCCINYLCHOLINE CHLORIDE 20 MG/ML IJ SOLN
INTRAMUSCULAR | Status: AC
  Filled 2013-06-09: qty 1

## 2013-06-09 MED ORDER — PROPOFOL 10 MG/ML IV BOLUS
INTRAVENOUS | Status: AC
  Filled 2013-06-09: qty 20

## 2013-06-09 SURGICAL SUPPLY — 53 items
BANDAGE GAUZE ELAST BULKY 4 IN (GAUZE/BANDAGES/DRESSINGS) ×2 IMPLANT
BLADE 15 SAFETY STRL DISP (BLADE) ×2 IMPLANT
CANISTER SUCTION 2500CC (MISCELLANEOUS) ×3 IMPLANT
CATH ROBINSON RED A/P 10FR (CATHETERS) IMPLANT
CATH ROBINSON RED A/P 18FR (CATHETERS) ×2 IMPLANT
CLEANER TIP ELECTROSURG 2X2 (MISCELLANEOUS) ×3 IMPLANT
CLOTH BEACON ORANGE TIMEOUT ST (SAFETY) ×1 IMPLANT
COAGULATOR SUCT 6 FR SWTCH (ELECTROSURGICAL)
COAGULATOR SUCT SWTCH 10FR 6 (ELECTROSURGICAL) IMPLANT
COVER SURGICAL LIGHT HANDLE (MISCELLANEOUS) ×2 IMPLANT
CRADLE DONUT ADULT HEAD (MISCELLANEOUS) IMPLANT
DECANTER SPIKE VIAL GLASS SM (MISCELLANEOUS) ×1 IMPLANT
DRAIN PENROSE 1/2X12 LTX STRL (WOUND CARE) ×4 IMPLANT
DRAPE ORTHO SPLIT 77X108 STRL (DRAPES) ×3
DRAPE SURG ORHT 6 SPLT 77X108 (DRAPES) IMPLANT
ELECT COATED BLADE 2.86 ST (ELECTRODE) ×3 IMPLANT
ELECT REM PT RETURN 9FT ADLT (ELECTROSURGICAL) ×3
ELECT REM PT RETURN 9FT PED (ELECTROSURGICAL)
ELECTRODE REM PT RETRN 9FT PED (ELECTROSURGICAL) IMPLANT
ELECTRODE REM PT RTRN 9FT ADLT (ELECTROSURGICAL) IMPLANT
GAUZE SPONGE 4X4 16PLY XRAY LF (GAUZE/BANDAGES/DRESSINGS) ×5 IMPLANT
GLOVE BIOGEL M STRL SZ7.5 (GLOVE) ×2 IMPLANT
GLOVE BIOGEL PI IND STRL 7.5 (GLOVE) IMPLANT
GLOVE BIOGEL PI INDICATOR 7.5 (GLOVE) ×2
GLOVE ECLIPSE 8.0 STRL XLNG CF (GLOVE) ×3 IMPLANT
GOWN PREVENTION PLUS XLARGE (GOWN DISPOSABLE) ×3 IMPLANT
GOWN STRL NON-REIN LRG LVL3 (GOWN DISPOSABLE) ×3 IMPLANT
KIT BASIN OR (CUSTOM PROCEDURE TRAY) ×3 IMPLANT
KIT ROOM TURNOVER OR (KITS) ×3 IMPLANT
NDL SPNL 22GX3.5 QUINCKE BK (NEEDLE) IMPLANT
NEEDLE SPNL 22GX3.5 QUINCKE BK (NEEDLE) IMPLANT
NS IRRIG 1000ML POUR BTL (IV SOLUTION) ×3 IMPLANT
PACK SURGICAL SETUP 50X90 (CUSTOM PROCEDURE TRAY) ×3 IMPLANT
PAD ARMBOARD 7.5X6 YLW CONV (MISCELLANEOUS) ×6 IMPLANT
PENCIL FOOT CONTROL (ELECTRODE) ×3 IMPLANT
SPECIMEN JAR SMALL (MISCELLANEOUS) IMPLANT
SPONGE GAUZE 4X4 12PLY (GAUZE/BANDAGES/DRESSINGS) ×2 IMPLANT
SPONGE TONSIL 1 RF SGL (DISPOSABLE) ×1 IMPLANT
STAPLER VISISTAT 35W (STAPLE) ×2 IMPLANT
SUT ETHILON 3 0 PS 1 (SUTURE) ×2 IMPLANT
SUT ETHILON 4 0 PS 2 18 (SUTURE) ×2 IMPLANT
SWAB CULTURE LIQUID MINI MALE (MISCELLANEOUS) ×1 IMPLANT
SYR BULB 3OZ (MISCELLANEOUS) ×2 IMPLANT
SYR CONTROL 10ML LL (SYRINGE) IMPLANT
TAPE SURG TRANSPORE 1 IN (GAUZE/BANDAGES/DRESSINGS) IMPLANT
TAPE SURGICAL TRANSPORE 1 IN (GAUZE/BANDAGES/DRESSINGS) ×2
TOWEL OR 17X24 6PK STRL BLUE (TOWEL DISPOSABLE) ×6 IMPLANT
TRAY FOLEY CATH 16FRSI W/METER (SET/KITS/TRAYS/PACK) ×2 IMPLANT
TUBE ANAEROBIC SPECIMEN COL (MISCELLANEOUS) ×3 IMPLANT
TUBE CONNECTING 12'X1/4 (SUCTIONS) ×2
TUBE CONNECTING 12X1/4 (SUCTIONS) ×3 IMPLANT
WATER STERILE IRR 1000ML POUR (IV SOLUTION) ×3 IMPLANT
YANKAUER SUCT BULB TIP NO VENT (SUCTIONS) ×5 IMPLANT

## 2013-06-09 NOTE — ED Notes (Signed)
Dr.Wolicki at bedside. 

## 2013-06-09 NOTE — ED Provider Notes (Signed)
CSN: [id number]     Arrival date & time 06/09/13  1751 History  This chart was scribed for Benny Lennert, MD by Luisa Dago, ED Scribe. This patient was seen in room APA01/[facility]01 and the patient's care was started at 6:22 PM.    Chief Complaint  Patient presents with  . Shortness of Breath    Patient is a 23 y.o. female presenting with pharyngitis. The history is provided by the patient and a relative. No language interpreter was used.  Sore Throat This is a new problem. The current episode started more than 1 week ago. The problem occurs constantly. The problem has been gradually worsening. Associated symptoms include chest pain, abdominal pain and shortness of breath. The symptoms are aggravated by eating and coughing. Nothing relieves the symptoms.   HPI Comments: Kimberly May is a 23 y.o. female who presents to the Emergency Department complaining of worsening sore throat that started 5 days ago. Pt is also complaining of associated chest pain and SOB, secondary to the pain. Pt relative reports that pt got her adenoids removed 6 days ago by Dr. Emeline Darling at [facility]. She states that since then se has not been able to eat or drink much because the pain makes it really hard for her to swallow. Pt reports taking her prescribed pain medication regularly until the pain got so bad that swallowing the pills became difficult for her. She is due for a follow-up with Dr. Emeline Darling on Monday.    Past Medical History  Diagnosis Date  . Tuberculosis 15 YRS AGO     FATHER HAD TB , PT WAS MED TX   Past Surgical History  Procedure Laterality Date  . Tympanostomy tube placement  DONE TWICE     BILAT    [facility])  . Cesarean section  2009   . Tonsillectomy  04/10/2011    Procedure: TONSILLECTOMY;  Surgeon: Antony Contras;  Location: [facility] OR;  Service: ENT;  Laterality: Bilateral;  . Incision and drainage abscess N/A 06/04/2013    Procedure: INCISION AND DRAINAGE PHARYNGEAL ABSCESS;  Surgeon: Melvenia Beam, MD;  Location: [facility] OR;  Service: ENT;  Laterality: N/A;  . Adenoidectomy     Family History  Problem Relation Age of Onset  . Cancer Father    History  Substance Use Topics  . Smoking status: Never Smoker   . Smokeless tobacco: Never Used  . Alcohol Use: No   OB History   Grav Para Term Preterm Abortions TAB SAB Ect Mult Living                 Review of Systems  Respiratory: Positive for shortness of breath.   Cardiovascular: Positive for chest pain.  Gastrointestinal: Positive for abdominal pain.  All other systems reviewed and are negative.    Allergies  Penicillins  Home Medications   Current Outpatient Rx  Name  Route  Sig  Dispense  Refill  . DM-Doxylamine-Acetaminophen (NYQUIL COLD & FLU PO)   Oral   Take 30 mLs by mouth every 6 (six) hours as needed (cough/cold/congestion).         Marland Kitchen etonogestrel (IMPLANON) 68 MG IMPL implant   Subcutaneous   Inject 1 each into the skin once.           Marland Kitchen Phenylephrine-APAP-Guaifenesin (MUCINEX FAST-MAX COLD & SINUS PO)   Oral   Take 20 mLs by mouth every 4 (four) hours as needed (cough/cold/congestion).  Triage Vitals: BP 133/82  Pulse 85  Temp(Src) 98.7 F (37.1 C) (Oral)  Resp 24  Ht 5' (1.524 m)  Wt 209 lb (94.802 kg)  BMI 40.82 kg/m2  SpO2 97%  Physical Exam  Constitutional: She is oriented to person, place, and time. She appears well-developed.  HENT:  Head: Normocephalic.  Mild swelling in pharynx. Moderate tenderness to anterior cervical chain on the left.  Eyes: Conjunctivae and EOM are normal. No scleral icterus.  Neck: Neck supple. No thyromegaly present.  Cardiovascular: Normal rate and regular rhythm.  Exam reveals no gallop and no friction rub.   No murmur heard. Pulmonary/Chest: No stridor. She has no wheezes. She has no rales. She exhibits no tenderness.  Abdominal: She exhibits no distension. There is no tenderness. There is no rebound.  Musculoskeletal: Normal range of  motion. She exhibits no edema.  Lymphadenopathy:    She has no cervical adenopathy.  Neurological: She is oriented to person, place, and time. She exhibits normal muscle tone. Coordination normal.  Skin: No rash noted. No erythema.  Psychiatric: She has a normal mood and affect. Her behavior is normal.    ED Course  Procedures (including critical care time)  DIAGNOSTIC STUDIES: Oxygen Saturation is 97% on RA, adequate by my interpretation.    COORDINATION OF CARE: 6:28 PM- Will order fluids. Pt advised of plan for treatment and pt agrees.  Medications  sodium chloride 0.9 % bolus 1,000 mL (not administered)     Labs Review Labs Reviewed  CBC WITH DIFFERENTIAL  COMPREHENSIVE METABOLIC PANEL   Imaging Review Ct Soft Tissue Neck W Contrast  06/09/2013   CLINICAL DATA:  Adenoid ectomy 6 days ago.  Sore throat.  EXAM: CT NECK WITH CONTRAST  TECHNIQUE: Multidetector CT imaging of the neck was performed using the standard protocol following the bolus administration of intravenous contrast.  CONTRAST:  75mL OMNIPAQUE IOHEXOL 300 MG/ML  SOLN  COMPARISON:  06/04/2013  FINDINGS: The patient continues to show though inflammatory edematous change in the parapharyngeal space. Fluid/ edema in the retropharyngeal/prevertebral space measures up to 1.7 cm in thickness. The lateral retropharyngeal node on the left now shows central low density, measuring 17 mm in diameter and extending over a length of 2.7 cm. This is consistent with an abscess. The node on the right at this level is enlarged but not suppurative. Inflammation extends down as far as the thoracic inlet and may involve the superior mediastinum. Patient now has pleural fluid on the right, not seen previously.  No evidence of vascular thrombosis at this time. There are reactive level 2 lymph nodes on the left, the largest measuring 1.6 x 2.4 cm.  IMPRESSION: Persistence of soft tissue inflammation throughout the parapharyngeal space of the neck  with pronounced prevertebral/ retropharyngeal fluid/edema. Suppuration of the lateral retropharyngeal node on the left consistent with abscess. Extension of the inflammation to the thoracic inlet and upper mediastinum with development of pleural fluid on the right.   Electronically Signed   By: Paulina FusiMark  Shogry M.D.   On: 06/09/2013 19:53    EKG Interpretation   None       MDM  Admit to [facility] dr. Lazarus SalinesWolicki,  Retropharyngeal abscess The chart was scribed for me under my direct supervision.  I personally performed the history, physical, and medical decision making and all procedures in the evaluation of this patient.Benny Lennert.    Kimberly Omura L Marico Buckle, MD 06/09/13 2112

## 2013-06-09 NOTE — ED Notes (Addendum)
Patient had emergency adenoidectomy on Sunday.Per patient feels like tongue is swollen. Able to handle secretions at this time.  Patient c/o sore throat with shortness of breath since. Patient taking prednisone, clindamycin, and hydrocodone. Patient notable hoarse.

## 2013-06-09 NOTE — ED Notes (Addendum)
Spoke with on call ENT doctor who is suppose to be seeing pt. Dr. Lazarus SalinesWolicki notified of pt cp. States to do an EKG and MD will call to see if chest x-ray is needed.

## 2013-06-09 NOTE — ED Notes (Signed)
Pt oxygen saturation 89%, placed pt on Nasal Canula at 2L. Pt currently 97%.

## 2013-06-09 NOTE — ED Notes (Signed)
Pt transfer from [place] to see Dr. Chauncy Passyompost and Dr. Lu DuffelLicky in regards to previous adenoid removal. Pt still c/o mild pains in throat, notes decreased breath sounds. C/o 5/10 cp. Alert and oriented, nad noted.

## 2013-06-09 NOTE — ED Notes (Signed)
Consent being fill out by ENT MD

## 2013-06-09 NOTE — ED Notes (Signed)
EKG given to Dr. Campos 

## 2013-06-09 NOTE — ED Notes (Signed)
Patient complaining of throat pain and requesting pain medications.

## 2013-06-09 NOTE — H&P (Signed)
Kimberly May,  Kimberly May 23 y.o., female [id number]     Chief Complaint: LEFT throat and neck pain  HPI: 23 yo wf developed sore throat approx 2 wks ago.  PCN allergy.  Rec'd 4 day course of Zithromax.  Presented to [facility] ER last weekend withincreasing pain.  CT showed abscess vs phlegmon retropharynx with enlarged LEFT retropharyngeal node.  Underwent throat exploration, adenoidectomy, Needle aspiration of retropharyngeal tissues with no frank pus identified.  Gram stain showed gm+ organisms in pairs, and gm- organisms.  Placed on IV Clinda, steroids.  Had relatively rapid recovery to po intake.  Now 24 hrs progressive neck and throat pain, difficulty swallowing, some chest pain and difficulty breathing.  Repeat Neck CT shows true abscessed LEFT retropharyngeal node with retropharyngeal fluid and inflammation tracking down neck to thoracic inlet.  Some RIGHT pleural effusion. No gas formation.  WBC 16K.    PMH: Past Medical History  Diagnosis Date  . Tuberculosis 50 YRS AGO     FATHER HAD TB , PT WAS MED TX    Surg Hx: Past Surgical History  Procedure Laterality Date  . Tympanostomy tube placement  DONE TWICE     BILAT    [facility])  . Cesarean section  2009   . Tonsillectomy  04/10/2011    Procedure: TONSILLECTOMY;  Surgeon: Onnie Graham;  Location: [facility] OR;  Service: ENT;  Laterality: Bilateral;  . Incision and drainage abscess N/A 06/04/2013    Procedure: INCISION AND DRAINAGE PHARYNGEAL ABSCESS;  Surgeon: Ruby Cola, MD;  Location: [facility];  Service: ENT;  Laterality: N/A;  . Adenoidectomy      FHx:   Family History  Problem Relation Age of Onset  . Cancer Father    SocHx:  reports that she has never smoked. She has never used smokeless tobacco. She reports that she does not drink alcohol or use illicit drugs.  ALLERGIES:  Allergies  Allergen Reactions  . Penicillins Rash     (Not in a hospital admission)  Results for orders placed during the hospital encounter of 06/09/13  (from the past 48 hour(s))  CBC WITH DIFFERENTIAL     Status: Abnormal   Collection Time    06/09/13  7:45 PM      Result Value Range   WBC 16.2 (*) 4.0 - 10.5 K/uL   RBC 3.88  3.87 - 5.11 MIL/uL   Hemoglobin 10.8 (*) 12.0 - 15.0 g/dL   HCT 32.1 (*) 36.0 - 46.0 %   MCV 82.7  78.0 - 100.0 fL   MCH 27.8  26.0 - 34.0 pg   MCHC 33.6  30.0 - 36.0 g/dL   RDW 12.8  11.5 - 15.5 %   Platelets 213  150 - 400 K/uL   Neutrophils Relative % 80 (*) 43 - 77 %   Lymphocytes Relative 12  12 - 46 %   Monocytes Relative 8  3 - 12 %   Eosinophils Relative 0  0 - 5 %   Basophils Relative 0  0 - 1 %   Neutro Abs 13.0 (*) 1.7 - 7.7 K/uL   Lymphs Abs 1.9  0.7 - 4.0 K/uL   Monocytes Absolute 1.3 (*) 0.1 - 1.0 K/uL   Eosinophils Absolute 0.0  0.0 - 0.7 K/uL   Basophils Absolute 0.0  0.0 - 0.1 K/uL  COMPREHENSIVE METABOLIC PANEL     Status: Abnormal   Collection Time    06/09/13  7:45 PM      Result Value  Range   Sodium 140  137 - 147 mEq/L   Potassium 4.3  3.7 - 5.3 mEq/L   Chloride 102  96 - 112 mEq/L   CO2 27  19 - 32 mEq/L   Glucose, Bld 85  70 - 99 mg/dL   BUN 15  6 - 23 mg/dL   Creatinine, Ser 0.65  0.50 - 1.10 mg/dL   Calcium 8.1 (*) 8.4 - 10.5 mg/dL   Total Protein 7.1  6.0 - 8.3 g/dL   Albumin 2.5 (*) 3.5 - 5.2 g/dL   AST 9  0 - 37 U/L   ALT 15  0 - 35 U/L   Alkaline Phosphatase 61  39 - 117 U/L   Total Bilirubin 0.4  0.3 - 1.2 mg/dL   GFR calc non Af Amer >90  >90 mL/min   GFR calc Af Amer >90  >90 mL/min   Comment: (NOTE)     The eGFR has been calculated using the CKD EPI equation.     This calculation has not been validated in all clinical situations.     eGFR's persistently <90 mL/min signify possible Chronic Kidney     Disease.   Ct Soft Tissue Neck W Contrast  06/09/2013   CLINICAL DATA:  Adenoid ectomy 6 days ago.  Sore throat.  EXAM: CT NECK WITH CONTRAST  TECHNIQUE: Multidetector CT imaging of the neck was performed using the standard protocol following the bolus  administration of intravenous contrast.  CONTRAST:  75m OMNIPAQUE IOHEXOL 300 MG/ML  SOLN  COMPARISON:  06/04/2013  FINDINGS: The patient continues to show though inflammatory edematous change in the parapharyngeal space. Fluid/ edema in the retropharyngeal/prevertebral space measures up to 1.7 cm in thickness. The lateral retropharyngeal node on the left now shows central low density, measuring 17 mm in diameter and extending over a length of 2.7 cm. This is consistent with an abscess. The node on the right at this level is enlarged but not suppurative. Inflammation extends down as far as the thoracic inlet and may involve the superior mediastinum. Patient now has pleural fluid on the right, not seen previously.  No evidence of vascular thrombosis at this time. There are reactive level 2 lymph nodes on the left, the largest measuring 1.6 x 2.4 cm.  IMPRESSION: Persistence of soft tissue inflammation throughout the parapharyngeal space of the neck with pronounced prevertebral/ retropharyngeal fluid/edema. Suppuration of the lateral retropharyngeal node on the left consistent with abscess. Extension of the inflammation to the thoracic inlet and upper mediastinum with development of pleural fluid on the right.   Electronically Signed   By: MNelson ChimesM.D.   On: 06/09/2013 19:53    ROS: chest pain and subjective SOB.   No abd pain.    Blood pressure 142/88, pulse 98, temperature 98.5 F (36.9 C), temperature source Oral, resp. rate 26, height 5' (1.524 m), weight 94.802 kg (209 lb), SpO2 98.00%.  PHYSICAL EXAM: Overall appearance:  Obese, distressed.  Hoarse.  No stridor. Head:  NCAT Ears: tube with granulation, AD, atelectatic changes AS Nose:  clear Oral Cavity:  Moist.  Teeth in good repair. Oral Pharynx/Hypopharynx/Larynx:  Swollen oropharygeal tissues with exudate.  Larynx with supraglottic swelling, vocal cords mobile. Neuro:  Grossly intact Neck:  Tender LEFT, no skin erythema.  No subQ  emphysema.  Limited ROM  Studies Reviewed:  CT scans 25 JAN, 351JAN.    Assessment/Plan LEFT retropharyngeal abscess with extension into deep neck.     Needs urgent  I&D LEFT neck up to nasopharyngeal level and down to thoracic inlet.  May need to leave intubated overnight or maybe 2 nights.  Discussed with Infectious disease, and with Critical Care.  May need to consult Thoracic surgery.  Will use Rocephin and Flagyl in place of Clindamycin.    Jodi Marble 10/25/735, 11:06 PM

## 2013-06-09 NOTE — Anesthesia Preprocedure Evaluation (Addendum)
Anesthesia Evaluation  Patient identified by MRN, date of birth, ID band Patient awake    Reviewed: Allergy & Precautions, H&P , NPO status , Patient's Chart, lab work & pertinent test results  Airway Mallampati: III TM Distance: <3 FB Neck ROM: Limited  Mouth opening: Limited Mouth Opening  Dental  (+) Teeth Intact and Dental Advisory Given   Pulmonary neg pulmonary ROS,          Cardiovascular negative cardio ROS      Neuro/Psych    GI/Hepatic negative GI ROS, Neg liver ROS,   Endo/Other  negative endocrine ROS  Renal/GU negative Renal ROS     Musculoskeletal   Abdominal   Peds  Hematology   Anesthesia Other Findings   Reproductive/Obstetrics                          Anesthesia Physical Anesthesia Plan  ASA: II and emergent  Anesthesia Plan: General   Post-op Pain Management:    Induction: Intravenous  Airway Management Planned: Video Laryngoscope Planned  Additional Equipment:   Intra-op Plan:   Post-operative Plan: Extubation in OR and Post-operative intubation/ventilation  Informed Consent: I have reviewed the patients History and Physical, chart, labs and discussed the procedure including the risks, benefits and alternatives for the proposed anesthesia with the patient or authorized representative who has indicated his/her understanding and acceptance.   Dental advisory given  Plan Discussed with: CRNA, Anesthesiologist and Surgeon  Anesthesia Plan Comments:        Anesthesia Quick Evaluation

## 2013-06-10 ENCOUNTER — Inpatient Hospital Stay (HOSPITAL_COMMUNITY): Payer: BC Managed Care – PPO

## 2013-06-10 DIAGNOSIS — J390 Retropharyngeal and parapharyngeal abscess: Secondary | ICD-10-CM

## 2013-06-10 DIAGNOSIS — J9600 Acute respiratory failure, unspecified whether with hypoxia or hypercapnia: Secondary | ICD-10-CM

## 2013-06-10 DIAGNOSIS — A419 Sepsis, unspecified organism: Secondary | ICD-10-CM

## 2013-06-10 DIAGNOSIS — N179 Acute kidney failure, unspecified: Secondary | ICD-10-CM

## 2013-06-10 DIAGNOSIS — R6521 Severe sepsis with septic shock: Secondary | ICD-10-CM

## 2013-06-10 HISTORY — DX: Sepsis, unspecified organism: A41.9

## 2013-06-10 HISTORY — DX: Acute respiratory failure, unspecified whether with hypoxia or hypercapnia: J96.00

## 2013-06-10 LAB — COMPREHENSIVE METABOLIC PANEL
ALBUMIN: 2 g/dL — AB (ref 3.5–5.2)
ALT: 10 U/L (ref 0–35)
AST: 7 U/L (ref 0–37)
Alkaline Phosphatase: 56 U/L (ref 39–117)
BUN: 13 mg/dL (ref 6–23)
CO2: 22 meq/L (ref 19–32)
CREATININE: 0.47 mg/dL — AB (ref 0.50–1.10)
Calcium: 6.9 mg/dL — ABNORMAL LOW (ref 8.4–10.5)
Chloride: 106 mEq/L (ref 96–112)
GFR calc non Af Amer: >90 mL/min (ref >90)
GLUCOSE: 102 mg/dL — AB (ref 70–99)
POTASSIUM: 3.7 meq/L (ref 3.7–5.3)
Sodium: 142 mEq/L (ref 137–147)
Total Bilirubin: 0.6 mg/dL (ref 0.3–1.2)
Total Protein: 6.3 g/dL (ref 6.0–8.3)

## 2013-06-10 LAB — BASIC METABOLIC PANEL
BUN: 16 mg/dL (ref 6–23)
CALCIUM: 7 mg/dL — AB (ref 8.4–10.5)
CO2: 22 meq/L (ref 19–32)
Chloride: 103 mEq/L (ref 96–112)
Creatinine, Ser: 1.43 mg/dL — ABNORMAL HIGH (ref 0.50–1.10)
GFR, EST AFRICAN AMERICAN: 60 mL/min — AB (ref >90)
GFR, EST NON AFRICAN AMERICAN: 51 mL/min — AB (ref >90)
Glucose, Bld: 79 mg/dL (ref 70–99)
Potassium: 5 mEq/L (ref 3.7–5.3)
SODIUM: 137 meq/L (ref 137–147)

## 2013-06-10 LAB — URINE MICROSCOPIC-ADD ON

## 2013-06-10 LAB — URINALYSIS, ROUTINE W REFLEX MICROSCOPIC
Bilirubin Urine: NEGATIVE
Bilirubin Urine: NEGATIVE
GLUCOSE, UA: NEGATIVE mg/dL
Glucose, UA: NEGATIVE mg/dL
Ketones, ur: NEGATIVE mg/dL
Ketones, ur: 15 mg/dL — AB
LEUKOCYTES UA: NEGATIVE
Nitrite: NEGATIVE
Nitrite: NEGATIVE
PH: 5.5 (ref 5.0–8.0)
Protein, ur: NEGATIVE mg/dL
Protein, ur: NEGATIVE mg/dL
SPECIFIC GRAVITY, URINE: 1.02 (ref 1.005–1.030)
SPECIFIC GRAVITY, URINE: 1.014 (ref 1.005–1.030)
UROBILINOGEN UA: 0.2 mg/dL (ref 0.0–1.0)
Urobilinogen, UA: 0.2 mg/dL (ref 0.0–1.0)
pH: 5 (ref 5.0–8.0)

## 2013-06-10 LAB — POCT I-STAT 3, ART BLOOD GAS (G3+)
ACID-BASE EXCESS: 1 mmol/L (ref 0.0–2.0)
Acid-Base Excess: 2 mmol/L (ref 0.0–2.0)
Acid-base deficit: 1 mmol/L (ref 0.0–2.0)
Acid-base deficit: 3 mmol/L — ABNORMAL HIGH (ref 0.0–2.0)
BICARBONATE: 27.7 meq/L — AB (ref 20.0–24.0)
BICARBONATE: 21.2 meq/L (ref 20.0–24.0)
Bicarbonate: 26.5 mEq/L — ABNORMAL HIGH (ref 20.0–24.0)
Bicarbonate: 22.4 mEq/L (ref 20.0–24.0)
O2 Saturation: 97 %
O2 Saturation: 99 %
O2 Saturation: 99 %
O2 Saturation: 99 %
PCO2 ART: 47.7 mmHg — AB (ref 35.0–45.0)
PH ART: 7.369 (ref 7.350–7.450)
PH ART: 7.29 — AB (ref 7.350–7.450)
PH ART: 7.412 (ref 7.350–7.450)
PO2 ART: 97 mmHg (ref 80.0–100.0)
PO2 ART: 113 mmHg — AB (ref 80.0–100.0)
Patient temperature: 97.7
TCO2: 29 mmol/L (ref 0–100)
TCO2: 28 mmol/L (ref 0–100)
TCO2: 23 mmol/L (ref 0–100)
TCO2: 22 mmol/L (ref 0–100)
pCO2 arterial: 55.3 mmHg — ABNORMAL HIGH (ref 35.0–45.0)
pCO2 arterial: 25.3 mmHg — ABNORMAL LOW (ref 35.0–45.0)
pCO2 arterial: 33.3 mmHg — ABNORMAL LOW (ref 35.0–45.0)
pH, Arterial: 7.556 — ABNORMAL HIGH (ref 7.350–7.450)
pO2, Arterial: 154 mmHg — ABNORMAL HIGH (ref 80.0–100.0)
pO2, Arterial: 133 mmHg — ABNORMAL HIGH (ref 80.0–100.0)

## 2013-06-10 LAB — CBC
HCT: 28 % — ABNORMAL LOW (ref 36.0–46.0)
HEMOGLOBIN: 9.1 g/dL — AB (ref 12.0–15.0)
MCH: 27.6 pg (ref 26.0–34.0)
MCHC: 32.5 g/dL (ref 30.0–36.0)
MCV: 84.8 fL (ref 78.0–100.0)
Platelets: 261 10*3/uL (ref 150–400)
RBC: 3.3 MIL/uL — AB (ref 3.87–5.11)
RDW: 13.4 % (ref 11.5–15.5)
WBC: 21.3 10*3/uL — AB (ref 4.0–10.5)

## 2013-06-10 LAB — TYPE AND SCREEN
ABO/RH(D): A POS
Antibody Screen: NEGATIVE

## 2013-06-10 LAB — GLUCOSE, CAPILLARY
GLUCOSE-CAPILLARY: 102 mg/dL — AB (ref 70–99)
GLUCOSE-CAPILLARY: 102 mg/dL — AB (ref 70–99)
GLUCOSE-CAPILLARY: 91 mg/dL (ref 70–99)
Glucose-Capillary: 118 mg/dL — ABNORMAL HIGH (ref 70–99)
Glucose-Capillary: 87 mg/dL (ref 70–99)
Glucose-Capillary: 92 mg/dL (ref 70–99)

## 2013-06-10 LAB — MRSA PCR SCREENING: MRSA by PCR: NEGATIVE

## 2013-06-10 LAB — FIBRINOGEN: Fibrinogen: 536 mg/dL — ABNORMAL HIGH (ref 204–475)

## 2013-06-10 LAB — LACTIC ACID, PLASMA: Lactic Acid, Venous: 0.5 mmol/L (ref 0.5–2.2)

## 2013-06-10 LAB — ABO/RH: ABO/RH(D): A POS

## 2013-06-10 LAB — TRIGLYCERIDES: Triglycerides: 104 mg/dL (ref <150)

## 2013-06-10 LAB — APTT: aPTT: 32 seconds (ref 24–37)

## 2013-06-10 LAB — TROPONIN I: Troponin I: <0.3 ng/mL (ref <0.30)

## 2013-06-10 MED ORDER — FENTANYL CITRATE 0.05 MG/ML IJ SOLN
INTRAMUSCULAR | Status: DC | PRN
  Administered 2013-06-10: 50 ug via INTRAVENOUS
  Administered 2013-06-10 (×3): 100 ug via INTRAVENOUS
  Administered 2013-06-10: 50 ug via INTRAVENOUS
  Administered 2013-06-10: 100 ug via INTRAVENOUS

## 2013-06-10 MED ORDER — DOPAMINE-DEXTROSE 3.2-5 MG/ML-% IV SOLN
INTRAVENOUS | Status: AC
  Administered 2013-06-10: 14:00:00
  Filled 2013-06-10: qty 250

## 2013-06-10 MED ORDER — ONDANSETRON HCL 4 MG/2ML IJ SOLN
INTRAMUSCULAR | Status: AC
  Filled 2013-06-10: qty 2

## 2013-06-10 MED ORDER — ONDANSETRON HCL 4 MG/2ML IJ SOLN
INTRAMUSCULAR | Status: DC | PRN
  Administered 2013-06-10: 4 mg via INTRAVENOUS

## 2013-06-10 MED ORDER — SODIUM CHLORIDE 0.9 % IV SOLN
Freq: Once | INTRAVENOUS | Status: AC
  Administered 2013-06-10: 14:00:00 via INTRAVENOUS

## 2013-06-10 MED ORDER — PRO-STAT SUGAR FREE PO LIQD
30.0000 mL | Freq: Three times a day (TID) | ORAL | Status: DC
  Administered 2013-06-11 – 2013-06-12 (×4): 30 mL
  Filled 2013-06-10 (×8): qty 30

## 2013-06-10 MED ORDER — PANTOPRAZOLE SODIUM 40 MG IV SOLR
40.0000 mg | Freq: Every day | INTRAVENOUS | Status: DC
Start: 2013-06-10 — End: 2013-06-12
  Administered 2013-06-10 – 2013-06-12 (×3): 40 mg via INTRAVENOUS
  Filled 2013-06-10 (×4): qty 40

## 2013-06-10 MED ORDER — SODIUM CHLORIDE 0.9 % IV BOLUS (SEPSIS)
500.0000 mL | Freq: Once | INTRAVENOUS | Status: AC
  Administered 2013-06-10: 500 mL via INTRAVENOUS

## 2013-06-10 MED ORDER — VECURONIUM BROMIDE 10 MG IV SOLR
INTRAVENOUS | Status: DC | PRN
  Administered 2013-06-10: 2 mg via INTRAVENOUS
  Administered 2013-06-10 (×2): 4 mg via INTRAVENOUS

## 2013-06-10 MED ORDER — SODIUM CHLORIDE 0.9 % IJ SOLN
10.0000 mL | INTRAMUSCULAR | Status: DC | PRN
  Administered 2013-06-11: 10 mL

## 2013-06-10 MED ORDER — STERILE WATER FOR INJECTION IJ SOLN
INTRAMUSCULAR | Status: AC
  Filled 2013-06-10: qty 10

## 2013-06-10 MED ORDER — FENTANYL CITRATE 0.05 MG/ML IJ SOLN: 100.0000 ug | INTRAMUSCULAR | Status: DC | PRN

## 2013-06-10 MED ORDER — VECURONIUM BROMIDE 10 MG IV SOLR
INTRAVENOUS | Status: AC
  Administered 2013-06-10: 10 mg
  Filled 2013-06-10: qty 10

## 2013-06-10 MED ORDER — PROPOFOL 10 MG/ML IV BOLUS
INTRAVENOUS | Status: DC | PRN
  Administered 2013-06-09: 200 mg via INTRAVENOUS

## 2013-06-10 MED ORDER — HYDROCORTISONE NA SUCCINATE PF 100 MG IJ SOLR
INTRAMUSCULAR | Status: DC | PRN
  Administered 2013-06-10: 100 mg via INTRAVENOUS

## 2013-06-10 MED ORDER — SODIUM CHLORIDE 0.9 % IJ SOLN
10.0000 mL | Freq: Two times a day (BID) | INTRAMUSCULAR | Status: DC
  Administered 2013-06-10 – 2013-06-14 (×8): 10 mL

## 2013-06-10 MED ORDER — PROPOFOL 10 MG/ML IV EMUL
INTRAVENOUS | Status: AC
  Filled 2013-06-10: qty 100

## 2013-06-10 MED ORDER — SODIUM CHLORIDE 0.9 % IV BOLUS (SEPSIS): 1000.0000 mL | INTRAVENOUS | Status: DC | PRN

## 2013-06-10 MED ORDER — VECURONIUM BROMIDE 10 MG IV SOLR
10.0000 mg | Freq: Once | INTRAVENOUS | Status: AC
  Administered 2013-06-10: 10 mg via INTRAVENOUS

## 2013-06-10 MED ORDER — INSULIN ASPART 100 UNIT/ML ~~LOC~~ SOLN
0.0000 [IU] | SUBCUTANEOUS | Status: DC
Start: 2013-06-10 — End: 2013-06-16
  Administered 2013-06-12: 2 [IU] via SUBCUTANEOUS

## 2013-06-10 MED ORDER — SUCCINYLCHOLINE CHLORIDE 20 MG/ML IJ SOLN
INTRAMUSCULAR | Status: DC | PRN
  Administered 2013-06-09: 100 mg via INTRAVENOUS

## 2013-06-10 MED ORDER — FENTANYL BOLUS VIA INFUSION
50.0000 ug | INTRAVENOUS | Status: DC | PRN
  Filled 2013-06-10 (×2): qty 100

## 2013-06-10 MED ORDER — 0.9 % SODIUM CHLORIDE (POUR BTL) OPTIME
TOPICAL | Status: DC | PRN
  Administered 2013-06-10: 1000 mL

## 2013-06-10 MED ORDER — SODIUM CHLORIDE 0.9 % IV SOLN
2.0000 g | Freq: Once | INTRAVENOUS | Status: AC
  Administered 2013-06-10: 2 g via INTRAVENOUS
  Filled 2013-06-10: qty 20

## 2013-06-10 MED ORDER — MIDAZOLAM HCL 2 MG/2ML IJ SOLN
INTRAMUSCULAR | Status: AC
  Filled 2013-06-10: qty 2

## 2013-06-10 MED ORDER — BIOTENE DRY MOUTH MT LIQD
15.0000 mL | Freq: Four times a day (QID) | OROMUCOSAL | Status: DC
  Administered 2013-06-10 – 2013-06-16 (×24): 15 mL via OROMUCOSAL

## 2013-06-10 MED ORDER — PROPOFOL INFUSION 10 MG/ML OPTIME
INTRAVENOUS | Status: DC | PRN
  Administered 2013-06-10: 75 ug/kg/min via INTRAVENOUS

## 2013-06-10 MED ORDER — ARTIFICIAL TEARS OP OINT
TOPICAL_OINTMENT | OPHTHALMIC | Status: DC | PRN
  Administered 2013-06-10: 1 via OPHTHALMIC

## 2013-06-10 MED ORDER — MIDAZOLAM HCL 5 MG/5ML IJ SOLN
INTRAMUSCULAR | Status: DC | PRN
  Administered 2013-06-10 (×2): 2 mg via INTRAVENOUS

## 2013-06-10 MED ORDER — VECURONIUM BROMIDE 10 MG IV SOLR
INTRAVENOUS | Status: AC
  Filled 2013-06-10: qty 10

## 2013-06-10 MED ORDER — SODIUM CHLORIDE 0.9 % IV SOLN
0.0000 ug/h | INTRAVENOUS | Status: DC
  Administered 2013-06-10: 100 ug/h via INTRAVENOUS
  Administered 2013-06-10: 350 ug/h via INTRAVENOUS
  Administered 2013-06-10: 400 ug/h via INTRAVENOUS
  Administered 2013-06-11 – 2013-06-12 (×2): 200 ug/h via INTRAVENOUS
  Administered 2013-06-12: 100 ug/h via INTRAVENOUS
  Administered 2013-06-12: 75 ug/h via INTRAVENOUS
  Administered 2013-06-13 (×2): 50 ug/h via INTRAVENOUS
  Administered 2013-06-14: 120 ug/h via INTRAVENOUS
  Filled 2013-06-10 (×9): qty 50

## 2013-06-10 MED ORDER — FENTANYL CITRATE 0.05 MG/ML IJ SOLN
INTRAMUSCULAR | Status: AC
  Filled 2013-06-10: qty 5

## 2013-06-10 MED ORDER — METRONIDAZOLE IN NACL 5-0.79 MG/ML-% IV SOLN
500.0000 mg | Freq: Three times a day (TID) | INTRAVENOUS | Status: DC
  Administered 2013-06-10 – 2013-06-12 (×7): 500 mg via INTRAVENOUS
  Filled 2013-06-10 (×10): qty 100

## 2013-06-10 MED ORDER — DOPAMINE-DEXTROSE 3.2-5 MG/ML-% IV SOLN: 5.0000 ug/kg/min | INTRAVENOUS | Status: DC

## 2013-06-10 MED ORDER — CHLORHEXIDINE GLUCONATE 0.12 % MT SOLN
15.0000 mL | Freq: Two times a day (BID) | OROMUCOSAL | Status: DC
  Administered 2013-06-10 – 2013-06-16 (×13): 15 mL via OROMUCOSAL
  Filled 2013-06-10 (×13): qty 15

## 2013-06-10 MED ORDER — PROPOFOL 10 MG/ML IV EMUL
0.0000 ug/kg/min | INTRAVENOUS | Status: DC
  Administered 2013-06-10: 65 ug/kg/min via INTRAVENOUS
  Administered 2013-06-10: 70.323 ug/kg/min via INTRAVENOUS
  Administered 2013-06-10: 61.533 ug/kg/min via INTRAVENOUS
  Administered 2013-06-10 (×2): 61.53 ug/kg/min via INTRAVENOUS
  Administered 2013-06-10: 65 ug/kg/min via INTRAVENOUS
  Administered 2013-06-10: 75 ug/kg/min via INTRAVENOUS
  Administered 2013-06-10: 61.533 ug/kg/min via INTRAVENOUS
  Administered 2013-06-11: 61.53 ug/kg/min via INTRAVENOUS
  Administered 2013-06-11: 20 ug/kg/min via INTRAVENOUS
  Administered 2013-06-11: 61.53 ug/kg/min via INTRAVENOUS
  Administered 2013-06-11: 35.162 ug/kg/min via INTRAVENOUS
  Administered 2013-06-11: 61.533 ug/kg/min via INTRAVENOUS
  Administered 2013-06-12: 5 ug/kg/min via INTRAVENOUS
  Administered 2013-06-12 – 2013-06-13 (×3): 15 ug/kg/min via INTRAVENOUS
  Administered 2013-06-14: 40 ug/kg/min via INTRAVENOUS
  Administered 2013-06-14: 20 ug/kg/min via INTRAVENOUS
  Filled 2013-06-10 (×23): qty 100

## 2013-06-10 MED ORDER — DEXTROSE 5 % IV SOLN
1.0000 g | Freq: Every day | INTRAVENOUS | Status: DC
  Administered 2013-06-10 – 2013-06-12 (×4): 1 g via INTRAVENOUS
  Filled 2013-06-10 (×5): qty 10

## 2013-06-10 MED ORDER — SODIUM CHLORIDE 0.9 % IV SOLN
INTRAVENOUS | Status: DC
  Administered 2013-06-10 – 2013-06-20 (×8): via INTRAVENOUS

## 2013-06-10 NOTE — Progress Notes (Signed)
INITIAL NUTRITION ASSESSMENT  DOCUMENTATION CODES Per approved criteria  -Morbid Obesity   INTERVENTION: 1.  Enteral nutrition; Unable to recommend TFs at this time due to increased propofol use.  Recommend Prostat TID to provide 300 kcal, 45g protein.   Nutrition provision with TF + propofol= 1276 kcal (13 kcal/kg), 45g protein  NUTRITION DIAGNOSIS: Inadequate oral intake related to inability to eat as evidenced by vent, NPO.   Monitor:  1.  Enteral nutrition; initiation with tolerance.  Pt to meet >/=90% estimated needs with nutrition support.  2.  Wt/wt change; monitor trends  Reason for Assessment: Vent  23 y.o. female  Admitting Dx: retropharyngeal abscess  ASSESSMENT: Pt admitted s/p emergency adenoidectomy 6 days ago with worsening pain and inability to eat or drink.  Pt found to have retropharyngeal abscess with extension into the deep neck. S/p I&D.  Patient is currently intubated on ventilator support.  MV: 7.3 L/min Temp (24hrs), Avg:98.6 F (37 C), Min:97.7 F (36.5 C), Max:99.1 F (37.3 C)  Propofol: 37 ml/hr providing 976 kcal/d  Nutrition Focused Physical Exam: Subcutaneous Fat:  Orbital Region: WNL Upper Arm Region: WNL Thoracic and Lumbar Region: WNL  Muscle:  Temple Region: WNL Clavicle Bone Region: WNL Clavicle and Acromion Bone Region: WNL Scapular Bone Region: WNL Dorsal Hand: WNL Patellar Region: WNL Anterior Thigh Region: WNL Posterior Calf Region: WNL  Edema: none present   Height: Ht Readings from Last 1 Encounters:  06/09/13 5' (1.524 m)    Weight: Wt Readings from Last 1 Encounters:  06/09/13 209 lb (94.802 kg)    Ideal Body Weight: 100 lbs  % Ideal Body Weight: 209%  Wt Readings from Last 10 Encounters:  06/09/13 209 lb (94.802 kg)  06/09/13 209 lb (94.802 kg)  06/04/13 208 lb (94.348 kg)  06/04/13 208 lb (94.348 kg)  05/24/13 208 lb (94.348 kg)  01/19/13 203 lb (92.08 kg)  03/27/11 194 lb 0.1 oz (88 kg)     Usual Body Weight: 205 lbs  % Usual Body Weight: 101%  BMI:  Body mass index is 40.82 kg/(m^2).  Estimated Nutritional Needs: Kcal: 1850 Permissive underfeeding:  1000-1316 kcal/d Protein: >/=100g Fluid: >1.8 L/day  Skin: no issues noted  Diet Order: NPO  EDUCATION NEEDS: -Education not appropriate at this time   Intake/Output Summary (Last 24 hours) at 06/10/13 0946 Last data filed at 06/10/13 0800  Gross per 24 hour  Intake 2173.48 ml  Output    850 ml  Net 1323.48 ml   Last BM: 1/25   Labs:   Recent Labs Lab 06/04/13 0820 06/05/13 0453 06/09/13 1945  NA 135* 136* 140  K 4.2 4.8 4.3  CL 97 102 102  CO2 24 22 27   BUN 31* 28* 15  CREATININE 1.46* 0.90 0.65  CALCIUM 9.0 8.4 8.1*  MG  --  2.3  --   PHOS  --  3.5  --   GLUCOSE 122* 178* 85    CBG (last 3)   Recent Labs  06/10/13 0119 06/10/13 0359 06/10/13 0708  GLUCAP 102* 118* 102*    Scheduled Meds: . antiseptic oral rinse  15 mL Mouth Rinse QID  . cefTRIAXone (ROCEPHIN)  IV  1 g Intravenous QHS  . chlorhexidine  15 mL Mouth Rinse BID  . insulin aspart  0-15 Units Subcutaneous Q4H  . metronidazole  500 mg Intravenous Q8H  . pantoprazole (PROTONIX) IV  40 mg Intravenous Daily    Continuous Infusions: . sodium chloride 75 mL/hr at 06/10/13  0200  . fentaNYL infusion INTRAVENOUS 400 mcg/hr (06/10/13 0931)  . propofol 65 mcg/kg/min (06/10/13 [id number])    Past Medical History  Diagnosis Date  . Tuberculosis 15 YRS AGO     FATHER HAD TB , PT WAS MED TX    Past Surgical History  Procedure Laterality Date  . Tympanostomy tube placement  DONE TWICE     [facility]ILAT    [facility]  . Cesarean section  2009   . Tonsillectomy  04/10/2011    Procedure: TONSILLECTOMY;  Surgeon: Antony Contraswight D Bates;  Location: [facility] OR;  Service: ENT;  Laterality: Bilateral;  . Incision and drainage abscess N/A 06/04/2013    Procedure: INCISION AND DRAINAGE PHARYNGEAL ABSCESS;  Surgeon: Melvenia BeamMitchell Gore, MD;  [facility];   Service: ENT;  Laterality: N/A;  . Adenoidectomy      Loyce DysKacie Despina Boan, MS RD LDN Clinical Inpatient Dietitian Pager: [phone] Weekend/After hours pager: [phone]

## 2013-06-10 NOTE — Progress Notes (Signed)
Peripherally Inserted Central Catheter/Midline Placement  The IV Nurse has discussed with the patient and/or persons authorized to consent for the patient, the purpose of this procedure and the potential benefits and risks involved with this procedure.  The benefits include less needle sticks, lab draws from the catheter and patient may be discharged home with the catheter.  Risks include, but not limited to, infection, bleeding, blood clot (thrombus formation), and puncture of an artery; nerve damage and irregular heat beat.  Alternatives to this procedure were also discussed.  PICC/Midline Placement Documentation  PICC Triple Lumen 06/10/13 PICC Right Basilic 40 cm 4 cm (Active)    Mother signed consent   Kimberly May, Kimberly May 06/10/2013, 5:10 PM

## 2013-06-10 NOTE — Consult Note (Signed)
Name: Kimberly May MRN: [id number] DOB: Jan 21, 1991    ADMISSION DATE:  06/09/2013 CONSULTATION DATE:  06/10/13  REFERRING MD :  Flo Shanks PRIMARY SERVICE: ENT  CHIEF COMPLAINT:  Post I&D of retropharyngeal abscess. Intubated.  BRIEF PATIENT DESCRIPTION:  23 years old female with no significant PMH. Underwent I&D of retropharyngeal abscess and possible early mediastinitis. Ot of the OR intubated. PCCM consult called for mechanical ventilation management.   SIGNIFICANT EVENTS / STUDIES:  06/09/13 -> I&D retropharyngeal abscess  LINES / TUBES: - Peripheral IV's  CULTURES: - FEW GROUP A STREP (S.PYOGENES) ISOLATED FROM PRIOR I&D ON 1/25   ANTIBIOTICS: - Rocephin - Flagyl  HISTORY OF PRESENT ILLNESS:   23 years old female with no significant PMH. Underwent I&D of retropharyngeal abscess and possible early mediastinitis. Out of the OR intubated. PCCM consult called for mechanical ventilation management. At the time of my examination the patient is intubated, sedated with propofol, hemodynamically stable on minimal vent settings.    PAST MEDICAL HISTORY :  Past Medical History  Diagnosis Date  . Tuberculosis 15 YRS AGO     FATHER HAD TB , PT WAS MED TX   Past Surgical History  Procedure Laterality Date  . Tympanostomy tube placement  DONE TWICE     BILAT    [facility])  . Cesarean section  2009   . Tonsillectomy  04/10/2011    Procedure: TONSILLECTOMY;  Surgeon: Antony Contras;  Location: [facility] OR;  Service: ENT;  Laterality: Bilateral;  . Incision and drainage abscess N/A 06/04/2013    Procedure: INCISION AND DRAINAGE PHARYNGEAL ABSCESS;  Surgeon: Melvenia Beam, MD;  Location: [facility] OR;  Service: ENT;  Laterality: N/A;  . Adenoidectomy     Prior to Admission medications   Medication Sig Start Date End Date Taking? Authorizing Provider  clindamycin (CLEOCIN) 75 MG/5ML solution Take 10 mLs by mouth 4 (four) times daily. 06/05/13  Yes Historical Provider, MD    HYDROcodone-acetaminophen (HYCET) 7.5-325 mg/15 ml solution Take 15 mLs by mouth every 6 (six) hours as needed. pain 06/05/13  Yes Historical Provider, MD  ondansetron (ZOFRAN-ODT) 4 MG disintegrating tablet Take 4 mg by mouth every 8 (eight) hours as needed. nausea 06/05/13  Yes Historical Provider, MD  prednisoLONE (PRELONE) 15 MG/5ML SOLN Take 10 mLs by mouth daily. For 3 days 06/05/13  Yes Historical Provider, MD  etonogestrel (IMPLANON) 68 MG IMPL implant Inject 1 each into the skin once.      Historical Provider, MD   Allergies  Allergen Reactions  . Penicillins Rash    FAMILY HISTORY:  Family History  Problem Relation Age of Onset  . Cancer Father    SOCIAL HISTORY:  reports that she has never smoked. She has never used smokeless tobacco. She reports that she does not drink alcohol or use illicit drugs.  REVIEW OF SYSTEMS:  Unable to provide  SUBJECTIVE:   VITAL SIGNS: Temp:  [97.7 F (36.5 C)-98.7 F (37.1 C)] 97.7 F (36.5 C) (01/30 2320) Pulse Rate:  [76-98] 91 (01/31 0319) Resp:  [14-27] 17 (01/31 0319) BP: (112-142)/(62-88) 118/80 mmHg (01/31 0319) SpO2:  [90 %-98 %] 98 % (01/31 0319) FiO2 (%):  [40 %] 40 % (01/31 0319) Weight:  [209 lb (94.802 kg)] 209 lb (94.802 kg) (01/30 1812) HEMODYNAMICS:   VENTILATOR SETTINGS: Vent Mode:  [-] PRVC FiO2 (%):  [40 %] 40 % Set Rate:  [14 bmp] 14 bmp Vt Set:  [400 mL] 400 mL PEEP:  [  5 cmH20] 5 cmH20 Plateau Pressure:  [17 cmH20] 17 cmH20 INTAKE / OUTPUT: Intake/Output     01/30 0701 - 01/31 0700   I.V. (mL/kg) 1300 (13.7)   Total Intake(mL/kg) 1300 (13.7)   Blood 100   Total Output 100   Net +1200         PHYSICAL EXAMINATION: General: Sedated, intubated, no acute distress. Eyes: Anicteric sclerae. Pupils are ENT: ETT in place. Trachea at midline.  Lymph: No cervical, supraclavicular, or axillary lymphadenopathy. Heart: Normal S1, S2. No murmurs, rubs, or gallops appreciated. No bruits, equal pulses. Lungs:  Normal excursion, no dullness to percussion. Good air movement bilaterally, without wheezes or crackles.  Abdomen: Abdomen soft, non-tender and not distended, normoactive bowel sounds. No hepatosplenomegaly or masses. Musculoskeletal: No clubbing or synovitis. No LE edema Skin: No rashes or lesions Neuro: Patient is sedated, unresponsive.     LABS:  CBC  Recent Labs Lab 06/04/13 0820 06/05/13 0453 06/09/13 1945  WBC 17.0* 19.6* 16.2*  HGB 12.3 10.9* 10.8*  HCT 36.2 32.2* 32.1*  PLT 172 173 213   Coag's No results found for this basename: APTT, INR,  in the last 168 hours BMET  Recent Labs Lab 06/04/13 0820 06/05/13 0453 06/09/13 1945  NA 135* 136* 140  K 4.2 4.8 4.3  CL 97 102 102  CO2 24 22 27   BUN 31* 28* 15  CREATININE 1.46* 0.90 0.65  GLUCOSE 122* 178* 85   Electrolytes  Recent Labs Lab 06/04/13 0820 06/05/13 0453 06/09/13 1945  CALCIUM 9.0 8.4 8.1*  MG  --  2.3  --   PHOS  --  3.5  --    Sepsis Markers No results found for this basename: LATICACIDVEN, PROCALCITON, O2SATVEN,  in the last 168 hours ABG  Recent Labs Lab 06/10/13 0252  PHART 7.369  PCO2ART 47.7*  PO2ART 97.0   Liver Enzymes  Recent Labs Lab 06/04/13 0820 06/05/13 0453 06/09/13 1945  AST 17 11 9   ALT 18 12 15   ALKPHOS 75 73 61  BILITOT 0.6 0.2* 0.4  ALBUMIN 3.3* 2.6* 2.5*   Cardiac Enzymes No results found for this basename: TROPONINI, PROBNP,  in the last 168 hours Glucose  Recent Labs Lab 06/10/13 0119  GLUCAP 102*    Imaging Ct Soft Tissue Neck W Contrast  06/09/2013   CLINICAL DATA:  Adenoid ectomy 6 days ago.  Sore throat.  EXAM: CT NECK WITH CONTRAST  TECHNIQUE: Multidetector CT imaging of the neck was performed using the standard protocol following the bolus administration of intravenous contrast.  CONTRAST:  75mL OMNIPAQUE IOHEXOL 300 MG/ML  SOLN  COMPARISON:  06/04/2013  FINDINGS: The patient continues to show though inflammatory edematous change in the  parapharyngeal space. Fluid/ edema in the retropharyngeal/prevertebral space measures up to 1.7 cm in thickness. The lateral retropharyngeal node on the left now shows central low density, measuring 17 mm in diameter and extending over a length of 2.7 cm. This is consistent with an abscess. The node on the right at this level is enlarged but not suppurative. Inflammation extends down as far as the thoracic inlet and may involve the superior mediastinum. Patient now has pleural fluid on the right, not seen previously.  No evidence of vascular thrombosis at this time. There are reactive level 2 lymph nodes on the left, the largest measuring 1.6 x 2.4 cm.  IMPRESSION: Persistence of soft tissue inflammation throughout the parapharyngeal space of the neck with pronounced prevertebral/ retropharyngeal fluid/edema. Suppuration of the lateral  retropharyngeal node on the left consistent with abscess. Extension of the inflammation to the thoracic inlet and upper mediastinum with development of pleural fluid on the right.   Electronically Signed   By: Paulina Fusi M.D.   On: 06/09/2013 19:53   Portable Chest Xray  06/10/2013   CLINICAL DATA:  Endotracheal tube placement  EXAM: PORTABLE CHEST - 1 VIEW  COMPARISON:  DG CHEST 2 VIEW dated 06/04/2013; CT NECK W/CM dated 06/04/2013; CT NECK W/CM dated 06/09/2013  FINDINGS: Interval intubation endotracheal to approximately 12 mm from carina. Consider retraction by 1 to 2 cm. NG tube extends the stomach. There is new diffuse airspace disease suggesting pulmonary edema. Left lower lobe atelectasis. New surgical drain in the left neck.  IMPRESSION: 1. Interval intubation. Endotracheal tube is 1.2 cm from carina. Consider retraction by 1-2 cm. 2. New pulmonary edema. 3. Surgical drain in the left neck   Electronically Signed   By: Genevive Bi M.D.   On: 06/10/2013 02:38     CXR:  1. Interval intubation. Endotracheal tube is 1.2 cm from carina.  Consider retraction by 1-2 cm.   2. New pulmonary edema.  3. Surgical drain in the left neck   ASSESSMENT / PLAN:  PULMONARY A: 1) Intubated post I&D of retropharyngeal abscess. P:   - Mechanical ventilation   - PRVC, Vt: 8cc/kg, PEEP: 5, RR: 16, FiO2: 100% and adjust to keep O2 sat > 94%   - VAP prevention order set   - Daily awakening and SBT   CARDIOVASCULAR A:  1) Hemodynamically stable P:  - ICU monitoring  RENAL A:   1) No issues P:   - Will follow chemistry  GASTROINTESTINAL A:   1) No issues P:   - GI prophylaxis with protonix  HEMATOLOGIC A:   1) No issues P:  - Will follow CBC  INFECTIOUS A:   1) Retropharyngeal abscess 2) Post I&D P:   - Rocephin - Flagyl  ENDOCRINE A:   1) No issues    NEUROLOGIC A:   1) Intubated, sedated P:   - Will start fentanyl drip - Will titrate down propofol drip to RASS of 0 to -1   I have personally obtained a history, examined the patient, evaluated laboratory and imaging results, formulated the assessment and plan and placed orders. CRITICAL CARE:  Critical Care Time devoted to patient care services described in this note is 45 minutes.   Overton Mam, MD Pulmonary and Critical Care Medicine [facility] Pager: [phone]  06/10/2013, 3:23 AM

## 2013-06-10 NOTE — Transfer of Care (Signed)
Immediate Anesthesia Transfer of Care Note  Patient: Kimberly May  Procedure(s) Performed: Procedure(s): INCISION AND DRAINAGE OF left retropharyneal abscess (Left)  Patient Location: ICU  Anesthesia Type:General  Level of Consciousness: Patient remains intubated per anesthesia plan  Airway & Oxygen Therapy: Patient remains intubated per anesthesia plan and Patient placed on Ventilator (see vital sign flow sheet for setting)  Post-op Assessment: Report given to PACU RN and Post -op Vital signs reviewed and stable  Post vital signs: Reviewed and stable  Complications: No apparent anesthesia complications

## 2013-06-10 NOTE — Progress Notes (Signed)
eLink Physician-Brief Progress Note Patient Name: [place] T Halberstadt DOB: 1990-12-05 MRN: [id number]  Date of Service  06/10/2013   HPI/Events of Note   ABG noted.   eICU Interventions   PS settings decreased. ABG to repeat at midnight.    Intervention Category Major Interventions: Acid-Base disturbance - evaluation and management  Teya Otterson R. 06/10/2013, 10:17 PM

## 2013-06-10 NOTE — Progress Notes (Signed)
eLink Physician-Brief Progress Note Patient Name: Kimberly May DOB: [birth year] MRN: [id number]  Date of Service  06/10/2013   HPI/Events of Note   Hypocalcemia  eICU Interventions   Repleted   Intervention Category Intermediate Interventions: Electrolyte abnormality - evaluation and management  Peggye Poon R. 06/10/2013, 8:38 PM

## 2013-06-10 NOTE — Anesthesia Procedure Notes (Addendum)
Procedure Name: Intubation Date/Time: 06/10/2013 12:01 AM Performed by: Luster LandsbergHASE, Veronika Heard R Pre-anesthesia Checklist: Patient identified, Emergency Drugs available and Suction available Patient Re-evaluated:Patient Re-evaluated prior to inductionOxygen Delivery Method: Circle system utilized Preoxygenation: Pre-oxygenation with 100% oxygen Intubation Type: Rapid sequence, IV induction and Cricoid Pressure applied Grade View: Grade II Tube type: Subglottic suction tube Tube size: 7.0 mm Number of attempts: 1 Airway Equipment and Method: Video-laryngoscopy and Stylet Placement Confirmation: ETT inserted through vocal cords under direct vision,  positive ETCO2 and breath sounds checked- equal and bilateral Secured at: 22 cm Tube secured with: Tape Dental Injury: Teeth and Oropharynx as per pre-operative assessment

## 2013-06-10 NOTE — Anesthesia Postprocedure Evaluation (Signed)
  Anesthesia Post-op Note  Patient: Kimberly May  Procedure(s) Performed: Procedure(s): INCISION AND DRAINAGE OF left retropharyneal abscess (Left)  Patient Location: PACU and SICU  Anesthesia Type:General  Level of Consciousness: Patient remains intubated per anesthesia plan  Airway and Oxygen Therapy: Patient remains intubated per anesthesia plan  Post-op Pain: mild  Post-op Assessment: Post-op Vital signs reviewed  Post-op Vital Signs: Reviewed  Complications: No apparent anesthesia complications

## 2013-06-10 NOTE — Progress Notes (Signed)
eLink Physician-Brief Progress Note Patient Name: Kimberly May DOB: [birth year] MRN: [id number]  Date of Service  06/10/2013   HPI/Events of Note    Resp Alkylosis Noted  eICU Interventions   Dr. Molli KnockYacoub already changed vent settings earlier. ABG ordered to evaluate effect of change.   Intervention Category Major Interventions: Acid-Base disturbance - evaluation and management  Romelia Bromell R. 06/10/2013, 7:53 PM

## 2013-06-10 NOTE — Op Note (Signed)
06/10/2013  1:06 AM    Kimberly May, Kimberly May  [id number]   Pre-Op Dx:  Left retropharyngeal abscess with deep neck infection and possible early mediastinitis  Post-op Dx: Same  Proc: Incision and drainage, left deep neck infection/retropharyngeal abscess   Surg:  Flo ShanksWOLICKI, Debraann Livingstone T MD  Anes:  GOT  EBL:  100 mL  Comp:  None  Findings:  Frank pus in a suppurating node in the left high retropharynx.  No frank pus identified in the retropharyngeal space.  Procedure: With the patient in a comfortable supine position, general orotracheal anesthesia was induced without difficulty. Significant supraglottic swelling was noted.   At an appropriate level, the patient was placed in reverse Trendelenburg. A shoulder roll was placed. The head was supported in the standard fashion. The head was rotated slightly to the right for access to the left neck. The neck was palpated with the findings as described above. A sterile preparation and draping of the left neck was accomplished.  A 5 cm incision in a relaxed skin tension line was marked and then sharply executed approximately 4 cm below the body of the mandible. This was carried down through skin and abundant subcutaneous fat. The platysma layer was identified and lysed using cautery. Minimal subplatysmal planes were elevated superiorly and inferiorly. The fatty tissues anterior to the sternocleidomastoid muscle were dissected. With sharp and blunt dissection, the retropharynx was approached anterior to the great vessels. The jugular vein and carotid artery were identified and not disturbed. At this point, with blunt dissection, the retropharyngeal space was entered. Finger dissection superiorly identified a soft suppurative node in the retropharynx. Frank pus was identified. Cultures were taken for aerobes and anaerobes. Finger dissection was carried down to the thoracic inlet and from side to side along the vertebral bodies. No frank pus was identified in  this area. Moderate bleeding stopped spontaneously.  Using an 3318 French red rubber catheter, irrigation with sterile saline was performed inferiorly and superiorly possibly 500 mL total. Hemostasis was observed.  1/2 inch Penrose drains were placed from the neck incision down into the thoracic inlet, and up to the base of skull and secured to the skin edges with 3-0 Ethilon suture. The wound was minimally closed at its edges with 4-0 Ethilon sutures. Again hemostasis was observed.  Sterile drapes were removed. The patient was cleaned. A bulky absorbent 4 x 4 and Kerlix dressing was placed.  At this point the procedure was completed. Patient was returned to  anesthesia and transferred directly to the 2100 intensive care unit.  Dispo:   OR to 2100  Plan:  IV antibiotics. Cultures pending. Will advance drains slowly. Anticipate intubation x36 hours.  Consultation with critical care and infectious disease requested and called.  For any signs of advancing difficulty with mediastinitis, will discuss with thoracic surgery.  Cephus RicherWOLICKI,  [name] MD

## 2013-06-10 NOTE — Progress Notes (Signed)
06/10/2013 2:25 PM  [place], [place] [id number]  Post-Op Day 1    Temp:  [97.7 F (36.5 C)-99.1 F (37.3 C)] 99 F (37.2 C) (01/31 0731) Pulse Rate:  [76-105] 105 (01/31 1330) Resp:  [0-30] 13 (01/31 1330) BP: (81-142)/(35-88) 88/35 mmHg (01/31 1330) SpO2:  [90 %-100 %] 100 % (01/31 1300) FiO2 (%):  [40 %-100 %] 60 % (01/31 1203) Weight:  [94.802 kg (209 lb)] 94.802 kg (209 lb) (01/30 1812),     Intake/Output Summary (Last 24 hours) at 06/10/13 1425 Last data filed at 06/10/13 1300  Gross per 24 hour  Intake 2651.48 ml  Output    975 ml  Net 1676.48 ml    Results for orders placed during the hospital encounter of 06/09/13 (from the past 24 hour(s))  CBC WITH DIFFERENTIAL     Status: Abnormal   Collection Time    06/09/13  7:45 PM      Result Value Range   WBC 16.2 (*) 4.0 - 10.5 K/uL   RBC 3.88  3.87 - 5.11 MIL/uL   Hemoglobin 10.8 (*) 12.0 - 15.0 g/dL   HCT 40.9 (*) 81.1 - 91.4 %   MCV 82.7  78.0 - 100.0 fL   MCH 27.8  26.0 - 34.0 pg   MCHC 33.6  30.0 - 36.0 g/dL   RDW 78.2  95.6 - 21.3 %   Platelets 213  150 - 400 K/uL   Neutrophils Relative % 80 (*) 43 - 77 %   Lymphocytes Relative 12  12 - 46 %   Monocytes Relative 8  3 - 12 %   Eosinophils Relative 0  0 - 5 %   Basophils Relative 0  0 - 1 %   Neutro Abs 13.0 (*) 1.7 - 7.7 K/uL   Lymphs Abs 1.9  0.7 - 4.0 K/uL   Monocytes Absolute 1.3 (*) 0.1 - 1.0 K/uL   Eosinophils Absolute 0.0  0.0 - 0.7 K/uL   Basophils Absolute 0.0  0.0 - 0.1 K/uL  COMPREHENSIVE METABOLIC PANEL     Status: Abnormal   Collection Time    06/09/13  7:45 PM      Result Value Range   Sodium 140  137 - 147 mEq/L   Potassium 4.3  3.7 - 5.3 mEq/L   Chloride 102  96 - 112 mEq/L   CO2 27  19 - 32 mEq/L   Glucose, Bld 85  70 - 99 mg/dL   BUN 15  6 - 23 mg/dL   Creatinine, Ser 0.86  0.50 - 1.10 mg/dL   Calcium 8.1 (*) 8.4 - 10.5 mg/dL   Total Protein 7.1  6.0 - 8.3 g/dL   Albumin 2.5 (*) 3.5 - 5.2 g/dL   AST 9  0 - 37 U/L   ALT 15  0 - 35  U/L   Alkaline Phosphatase 61  39 - 117 U/L   Total Bilirubin 0.4  0.3 - 1.2 mg/dL   GFR calc non Af Amer >90  >90 mL/min   GFR calc Af Amer >90  >90 mL/min  GLUCOSE, CAPILLARY     Status: Abnormal   Collection Time    06/10/13  1:19 AM      Result Value Range   Glucose-Capillary 102 (*) 70 - 99 mg/dL  TRIGLYCERIDES     Status: None   Collection Time    06/10/13  1:44 AM      Result Value Range   Triglycerides 104  <150 mg/dL  MRSA PCR SCREENING     Status: None   Collection Time    06/10/13  1:46 AM      Result Value Range   MRSA by PCR NEGATIVE  NEGATIVE  URINALYSIS, ROUTINE W REFLEX MICROSCOPIC     Status: Abnormal   Collection Time    06/10/13  2:45 AM      Result Value Range   Color, Urine YELLOW  YELLOW   APPearance CLEAR  CLEAR   Specific Gravity, Urine 1.020  1.005 - 1.030   pH 5.5  5.0 - 8.0   Glucose, UA NEGATIVE  NEGATIVE mg/dL   Hgb urine dipstick MODERATE (*) NEGATIVE   Bilirubin Urine NEGATIVE  NEGATIVE   Ketones, ur NEGATIVE  NEGATIVE mg/dL   Protein, ur NEGATIVE  NEGATIVE mg/dL   Urobilinogen, UA 0.2  0.0 - 1.0 mg/dL   Nitrite NEGATIVE  NEGATIVE   Leukocytes, UA NEGATIVE  NEGATIVE  URINE MICROSCOPIC-ADD ON     Status: None   Collection Time    06/10/13  2:45 AM      Result Value Range   WBC, UA 0-2  <3 WBC/hpf   RBC / HPF 3-6  <3 RBC/hpf   Bacteria, UA RARE  RARE   Urine-Other LESS THAN 10 mL OF URINE SUBMITTED    POCT I-STAT 3, BLOOD GAS (G3+)     Status: Abnormal   Collection Time    06/10/13  2:52 AM      Result Value Range   pH, Arterial 7.369  7.350 - 7.450   pCO2 arterial 47.7 (*) 35.0 - 45.0 mmHg   pO2, Arterial 97.0  80.0 - 100.0 mmHg   Bicarbonate 27.7 (*) 20.0 - 24.0 mEq/L   TCO2 29  0 - 100 mmol/L   O2 Saturation 97.0     Acid-Base Excess 2.0  0.0 - 2.0 mmol/L   Patient temperature 97.7 F     Collection site RADIAL, ALLEN'S TEST ACCEPTABLE     Drawn by RT     Sample type ARTERIAL    GLUCOSE, CAPILLARY     Status: Abnormal    Collection Time    06/10/13  3:59 AM      Result Value Range   Glucose-Capillary 118 (*) 70 - 99 mg/dL   Comment 1 Documented in Chart     Comment 2 Notify RN    GLUCOSE, CAPILLARY     Status: Abnormal   Collection Time    06/10/13  7:08 AM      Result Value Range   Glucose-Capillary 102 (*) 70 - 99 mg/dL  POCT I-STAT 3, BLOOD GAS (G3+)     Status: Abnormal   Collection Time    06/10/13 10:38 AM      Result Value Range   pH, Arterial 7.290 (*) 7.350 - 7.450   pCO2 arterial 55.3 (*) 35.0 - 45.0 mmHg   pO2, Arterial 154.0 (*) 80.0 - 100.0 mmHg   Bicarbonate 26.5 (*) 20.0 - 24.0 mEq/L   TCO2 28  0 - 100 mmol/L   O2 Saturation 99.0     Acid-base deficit 1.0  0.0 - 2.0 mmol/L   Patient temperature 99.0 F     Collection site RADIAL, ALLEN'S TEST ACCEPTABLE     Drawn by Operator     Sample type ARTERIAL    GLUCOSE, CAPILLARY     Status: None   Collection Time    06/10/13 11:35 AM      Result Value Range  Glucose-Capillary 91  70 - 99 mg/dL    SUBJECTIVE:  Sedated/paralyzed.  Nurses report O2 desats into 40% range earlier.  Now on 100% FIO2.  Assisting ventilator minimally.  OBJECTIVE:  Large serous drainage.  Dressing changed.  No obvious change neck edema.    IMPRESSION:  Resp compromise from pulm edema, pleural effusion. Satisfactory post op course.    PLAN:  Cont IV abx.  Await cultures.  Appreciate Critical Care assistance with pulm edema, resp insuff, pleural effusions.   Will plan to perform flexible laryngoscopy prior to extubation.    Flo Shanks

## 2013-06-10 NOTE — Consult Note (Signed)
Name: Kimberly May MRN: [id number] DOB: 05/15/1990    ADMISSION DATE:  06/09/2013 CONSULTATION DATE:  06/10/13  REFERRING MD :  Flo Shanks PRIMARY SERVICE: ENT  CHIEF COMPLAINT:  Post I&D of retropharyngeal abscess. Intubated.  BRIEF PATIENT DESCRIPTION:  23 years old female with no significant PMH. Underwent I&D of retropharyngeal abscess and possible early mediastinitis. Ot of the OR intubated. PCCM consult called for mechanical ventilation management.   SIGNIFICANT EVENTS / STUDIES:  06/09/13 -> I&D retropharyngeal abscess  LINES / TUBES: - Peripheral IV's 1-31 OTT>> CULTURES: - FEW GROUP A STREP (S.PYOGENES) ISOLATED FROM PRIOR I&D ON 1/25 1/31 ana>> 1/31 wound>>  ANTIBIOTICS: 1/30  Rocephin>> -1/30 Flagyl>> 1/31 cleocin>>   HISTORY OF PRESENT ILLNESS:   23 years old female with no significant PMH. Underwent I&D of retropharyngeal abscess and possible early mediastinitis. Out of the OR intubated. PCCM consult called for mechanical ventilation management. At the time of my examination the patient is intubated, sedated with propofol, hemodynamically stable on minimal vent settings.   SUBJECTIVE:  Sedated and now on ARDS Protocol. Brief period of hypotension VITAL SIGNS: Temp:  [97.7 F (36.5 C)-99.1 F (37.3 C)] 99 F (37.2 C) (01/31 0731) Pulse Rate:  [76-102] 101 (01/31 0823) Resp:  [0-30] 30 (01/31 0823) BP: (82-142)/(38-88) 82/43 mmHg (01/31 0823) SpO2:  [90 %-100 %] 99 % (01/31 0823) FiO2 (%):  [40 %-100 %] 60 % (01/31 0823) Weight:  [209 lb (94.802 kg)] 209 lb (94.802 kg) (01/30 1812) HEMODYNAMICS:   VENTILATOR SETTINGS: Vent Mode:  [-] PRVC FiO2 (%):  [40 %-100 %] 60 % Set Rate:  [14 bmp-26 bmp] 26 bmp Vt Set:  [300 mL-400 mL] 300 mL PEEP:  [5 cmH20-10 cmH20] 10 cmH20 Plateau Pressure:  [17 cmH20-24 cmH20] 24 cmH20 INTAKE / OUTPUT: Intake/Output     01/30 0701 - 01/31 0700 01/31 0701 - 02/01 0700   I.V. (mL/kg) 2021.5 (21.3) 152 (1.6)    Total Intake(mL/kg) 2021.5 (21.3) 152 (1.6)   Urine (mL/kg/hr) 575 175 (0.7)   Blood 100    Total Output 675 175   Net +1346.5 -23          PHYSICAL EXAMINATION: General: Sedated, intubated, no acute distress. Eyes: Anicteric sclerae. Pupils are pin point ENT: ETT in place. Trachea at midline. Neck dressing with bloody drainage Lymph: No cervical, supraclavicular, or axillary lymphadenopathy. Heart: Normal S1, S2. No murmurs, rubs, or gallops appreciated. No bruits, equal pulses. Lungs: Normal excursion, no dullness to percussion. Good air movement bilaterally, without wheezes or crackles.  Abdomen: Abdomen soft, non-tender and not distended, normoactive bowel sounds. No hepatosplenomegaly or masses. Musculoskeletal: No clubbing or synovitis. No LE edema Skin: No rashes or lesions Neuro: Patient is sedated, unresponsive or agitated   LABS:  CBC  Recent Labs Lab 06/04/13 0820 06/05/13 0453 06/09/13 1945  WBC 17.0* 19.6* 16.2*  HGB 12.3 10.9* 10.8*  HCT 36.2 32.2* 32.1*  PLT 172 173 213   Coag's No results found for this basename: APTT, INR,  in the last 168 hours BMET  Recent Labs Lab 06/04/13 0820 06/05/13 0453 06/09/13 1945  NA 135* 136* 140  K 4.2 4.8 4.3  CL 97 102 102  CO2 24 22 27   BUN 31* 28* 15  CREATININE 1.46* 0.90 0.65  GLUCOSE 122* 178* 85   Electrolytes  Recent Labs Lab 06/04/13 0820 06/05/13 0453 06/09/13 1945  CALCIUM 9.0 8.4 8.1*  MG  --  2.3  --   PHOS  --  3.5  --    Sepsis Markers No results found for this basename: LATICACIDVEN, PROCALCITON, O2SATVEN,  in the last 168 hours ABG  Recent Labs Lab 06/10/13 0252  PHART 7.369  PCO2ART 47.7*  PO2ART 97.0   Liver Enzymes  Recent Labs Lab 06/04/13 0820 06/05/13 0453 06/09/13 1945  AST 17 11 9   ALT 18 12 15   ALKPHOS 75 73 61  BILITOT 0.6 0.2* 0.4  ALBUMIN 3.3* 2.6* 2.5*   Cardiac Enzymes No results found for this basename: TROPONINI, PROBNP,  in the last 168  hours Glucose  Recent Labs Lab 06/10/13 0119 06/10/13 0359 06/10/13 0708  GLUCAP 102* 118* 102*    Imaging Ct Soft Tissue Neck W Contrast  06/09/2013   CLINICAL DATA:  Adenoid ectomy 6 days ago.  Sore throat.  EXAM: CT NECK WITH CONTRAST  TECHNIQUE: Multidetector CT imaging of the neck was performed using the standard protocol following the bolus administration of intravenous contrast.  CONTRAST:  75mL OMNIPAQUE IOHEXOL 300 MG/ML  SOLN  COMPARISON:  06/04/2013  FINDINGS: The patient continues to show though inflammatory edematous change in the parapharyngeal space. Fluid/ edema in the retropharyngeal/prevertebral space measures up to 1.7 cm in thickness. The lateral retropharyngeal node on the left now shows central low density, measuring 17 mm in diameter and extending over a length of 2.7 cm. This is consistent with an abscess. The node on the right at this level is enlarged but not suppurative. Inflammation extends down as far as the thoracic inlet and may involve the superior mediastinum. Patient now has pleural fluid on the right, not seen previously.  No evidence of vascular thrombosis at this time. There are reactive level 2 lymph nodes on the left, the largest measuring 1.6 x 2.4 cm.  IMPRESSION: Persistence of soft tissue inflammation throughout the parapharyngeal space of the neck with pronounced prevertebral/ retropharyngeal fluid/edema. Suppuration of the lateral retropharyngeal node on the left consistent with abscess. Extension of the inflammation to the thoracic inlet and upper mediastinum with development of pleural fluid on the right.   Electronically Signed   By: Paulina FusiMark  Shogry M.D.   On: 06/09/2013 19:53   Dg Chest Port 1 View  06/10/2013   CLINICAL DATA:  Endotracheal tube positioning.  EXAM: PORTABLE CHEST - 1 VIEW  COMPARISON:  Chest radiograph June 10, 2013 at 4:44 a.m.  FINDINGS: Retracted endotracheal tube, distal tip now projects 2 cm above the carina. Nasogastric tube tip  not imaged but past the proximal stomach. Slightly decreased interstitial and alveolar airspace opacities, with suspect at least small bilateral pleural effusion. Cardiomediastinal silhouette is predominately obscured by the parenchymal process. No pneumothorax. Soft tissue planes and included osseous structures are nonsuspicious.  IMPRESSION: Retracted endotracheal tube, distal tip now projects 2 cm above the carina without apparent change in position of the nasogastric tube.  Slightly decreased interstitial and alveolar airspace opacities with small pleural effusions may reflect pulmonary edema.   Electronically Signed   By: Awilda Metroourtnay  Bloomer   On: 06/10/2013 05:14   Dg Chest Portable 1 View  06/10/2013   ADDENDUM REPORT: 06/10/2013 05:11  ADDENDUM: Disregard the above report.  COMPARISON:  Chest radiograph June 10, 2013 at 2:05 a.m.  FINDINGS: Diffuse interstitial and alveolar airspace opacities are similar, predominately obscuring the lung parenchyma. The endotracheal tube tip projects at the right mainstem bronchus. Nasogastric tube tip is not imaged though, past the gastroesophageal junction. No pneumothorax.  Multiple EKG lines overlie the patient and may obscure subtle underlying  pathology. Soft tissue planes and included osseous structures are unremarkable.  IMPRESSION: 1. Endotracheal tube tip projects at right mainstem bronchus, recommend 1-2 cm of retraction. No apparent change in positioning of nasogastric tube. 2. Diffuse interstitial and alveolar airspace opacities may reflect pulmonary edema or even ARDS.   Electronically Signed   By: Awilda Metro   On: 06/10/2013 05:11   06/10/2013   CLINICAL DATA:  Oxygen desaturation, intubation.  EXAM: PORTABLE CHEST - 1 VIEW  COMPARISON:  Chest radiograph June 10, 2013 at 2:05 a.m.  FINDINGS: The heart size and mediastinal contours are within normal limits. Both lungs are clear. The visualized skeletal structures are unremarkable.  IMPRESSION: No  active disease.  Electronically Signed: By: Awilda Metro On: 06/10/2013 05:01   Portable Chest Xray  06/10/2013   CLINICAL DATA:  Endotracheal tube placement  EXAM: PORTABLE CHEST - 1 VIEW  COMPARISON:  DG CHEST 2 VIEW dated 06/04/2013; CT NECK W/CM dated 06/04/2013; CT NECK W/CM dated 06/09/2013  FINDINGS: Interval intubation endotracheal to approximately 12 mm from carina. Consider retraction by 1 to 2 cm. NG tube extends the stomach. There is new diffuse airspace disease suggesting pulmonary edema. Left lower lobe atelectasis. New surgical drain in the left neck.  IMPRESSION: 1. Interval intubation. Endotracheal tube is 1.2 cm from carina. Consider retraction by 1-2 cm. 2. New pulmonary edema. 3. Surgical drain in the left neck   Electronically Signed   By: Genevive Bi M.D.   On: 06/10/2013 02:38   ASSESSMENT / PLAN:  PULMONARY A: 1) Intubated post I&D of retropharyngeal abscess. P:   - Mechanical ventilation. - Change to PCV. - F/U ABG. - Chest CT without contrast to evaluate pleural effusion. - CXR and ABG in AM.  CARDIOVASCULAR A:  1) Hemodynamically fluctuant at this point, needs central access that is in the SVC.  P:  - ICU monitoring - PICC line placement today. - Follow CVP and may need sepsis protocol if remains hypotensive but so far responding to IVF.  RENAL A:   1) K is 5. P:   - Will follow chemistry. - Aggressive hydration.  GASTROINTESTINAL A:   1) No issues P:   - GI prophylaxis with protonix. - Consult nutrition for TF as per nutrition.  HEMATOLOGIC A:   1) No issues P:  - Will follow CBC.  INFECTIOUS A:   1) Retropharyngeal abscess 2) Post I&D P:   See flows  ENDOCRINE A:   1) No issues   NEUROLOGIC A:   1) Intubated, sedated P:   - Sedate for vent tolerance  Brett Canales Minor ACNP [facility] PCCM Pager [phone] till 3 pm If no answer page [phone] 06/10/2013, 9:40 AM  Start TF, place PICC line, follow CVP, hydrate, may need sepsis  protocol if BP remains low.  CC time 45 min.  Patient seen and examined, agree with above note.  I dictated the care and orders written for this patient under my direction.  Alyson Reedy, MD [phone]

## 2013-06-10 NOTE — Consult Note (Signed)
[facility]     Reason for Consult: abscess    Referring Physician: Dr. Lazarus SalinesWolicki  Active Problems:   Retropharyngeal abscess   . antiseptic oral rinse  15 mL Mouth Rinse QID  . cefTRIAXone (ROCEPHIN)  IV  1 g Intravenous QHS  . chlorhexidine  15 mL Mouth Rinse BID  . insulin aspart  0-15 Units Subcutaneous Q4H  . metronidazole  500 mg Intravenous Q8H  . pantoprazole (PROTONIX) IV  40 mg Intravenous Daily    Recommendations: Continue with current antibiotics Await cultures   Assessment: She has an abscess of the retropharyngeal space that required operative management.     Antibiotics: Ceftriaxone day 2 Flagyl day 2 Previously on clindamycin  HPI: [name] is a 23 y.o. female with a sore throat that began 2 weeks ago, placed on zithromax, then clindamycin, but progressed.  Came to ED last night and CT showed progressive abscess compared to previous CT 5 days ago.  Seen by ENT and taken to OR last night.  Previous culture with Group A strep.  Culture from last night pending.  Patient currently intubated and sedated.     Review of Systems: A comprehensive review of systems was negative.  Past Medical History  Diagnosis Date  . Tuberculosis 15 YRS AGO     FATHER HAD TB , PT WAS MED [place]    History  Substance Use Topics  . Smoking status: Never Smoker   . Smokeless tobacco: Never Used  . Alcohol Use: No    Family History  Problem Relation Age of Onset  . Cancer Father    Allergies  Allergen Reactions  . Penicillins Rash    OBJECTIVE: Blood pressure 82/43, pulse 101, temperature 99 F (37.2 C), temperature source Oral, resp. rate 30, height 5' (1.524 m), weight 209 lb (94.802 kg), last menstrual period 06/08/2013, SpO2 99.00%. General: intubated, sedated Skin: no rashes Lungs: CTA B Cor: RRR without m Abdomen: soft, nt, nd HEENT: neck wrapped  Microbiology: Recent Results (from the past 240 hour(s))  CULTURE,  ROUTINE-ABSCESS     Status: None   Collection Time    06/04/13  1:02 PM      Result Value Range Status   Specimen Description ABSCESS   Final   Special Requests     Final   Value: PATIENT ON FOLLOWING CLINDAMYCIN RETROPHARYNGEAL ABSCESS   Gram Stain     Final   Value: MODERATE WBC PRESENT, PREDOMINANTLY PMN     RARE SQUAMOUS EPITHELIAL CELLS PRESENT     ABUNDANT GRAM POSITIVE COCCI IN PAIRS     IN CLUSTERS MODERATE GRAM NEGATIVE RODS     FEW GRAM NEGATIVE COCCI   Culture     Final   Value: FEW GROUP A STREP (S.PYOGENES) ISOLATED     [facility]   Report Status 06/07/2013 FINAL   Final  ANAEROBIC CULTURE     Status: None   Collection Time    06/04/13  1:02 PM      Result Value Range Status   Specimen Description ABSCESS   Final   Special Requests     Final   Value: PATIENT ON FOLLOWING CLINDAMYCIN RETRO PHARYNGEAL ABSCESS   Gram Stain     Final   Value: FEW WBC PRESENT, PREDOMINANTLY PMN     FEW SQUAMOUS EPITHELIAL CELLS PRESENT     FEW GRAM POSITIVE COCCI IN PAIRS     IN CLUSTERS  Performed at [facility]     Final   Value: NO ANAEROBES ISOLATED; CULTURE IN PROGRESS FOR 5 DAYS     Performed at [facility]   Report Status PENDING   Incomplete  MRSA PCR SCREENING     Status: None   Collection Time    06/10/13  1:46 AM      Result Value Range Status   MRSA by PCR NEGATIVE  NEGATIVE Final   Comment:            The GeneXpert MRSA Assay (FDA     approved for NASAL specimens     only), is one component of a     comprehensive MRSA colonization     surveillance program. It is not     intended to diagnose MRSA     infection nor to guide or     monitor treatment for     MRSA infections.    Staci Righter, MD [email or web address] [phone] pager  [phone] cell 06/10/2013, 10:21 AM

## 2013-06-11 ENCOUNTER — Inpatient Hospital Stay (HOSPITAL_COMMUNITY): Payer: BC Managed Care – PPO

## 2013-06-11 LAB — ANAEROBIC CULTURE

## 2013-06-11 LAB — CBC WITH DIFFERENTIAL/PLATELET
BASOS ABS: 0 10*3/uL (ref 0.0–0.1)
BASOS PCT: 0 % (ref 0–1)
Eosinophils Absolute: 0.1 10*3/uL (ref 0.0–0.7)
Eosinophils Relative: 1 % (ref 0–5)
HCT: 25.2 % — ABNORMAL LOW (ref 36.0–46.0)
HEMOGLOBIN: 8.3 g/dL — AB (ref 12.0–15.0)
Lymphocytes Relative: 14 % (ref 12–46)
Lymphs Abs: 2.1 10*3/uL (ref 0.7–4.0)
MCH: 27.8 pg (ref 26.0–34.0)
MCHC: 32.9 g/dL (ref 30.0–36.0)
MCV: 84.3 fL (ref 78.0–100.0)
Monocytes Absolute: 1.5 10*3/uL — ABNORMAL HIGH (ref 0.1–1.0)
Monocytes Relative: 10 % (ref 3–12)
NEUTROS ABS: 11.3 10*3/uL — AB (ref 1.7–7.7)
NEUTROS PCT: 75 % (ref 43–77)
Platelets: 219 10*3/uL (ref 150–400)
RBC: 2.99 MIL/uL — ABNORMAL LOW (ref 3.87–5.11)
RDW: 13.4 % (ref 11.5–15.5)
WBC: 15 10*3/uL — ABNORMAL HIGH (ref 4.0–10.5)

## 2013-06-11 LAB — CORTISOL: Cortisol, Plasma: 13.7 ug/dL

## 2013-06-11 LAB — CBC
HCT: 28.2 % — ABNORMAL LOW (ref 36.0–46.0)
Hemoglobin: 9.2 g/dL — ABNORMAL LOW (ref 12.0–15.0)
MCH: 27.6 pg (ref 26.0–34.0)
MCHC: 32.6 g/dL (ref 30.0–36.0)
MCV: 84.7 fL (ref 78.0–100.0)
PLATELETS: 247 10*3/uL (ref 150–400)
RBC: 3.33 MIL/uL — ABNORMAL LOW (ref 3.87–5.11)
RDW: 13.5 % (ref 11.5–15.5)
WBC: 17.6 10*3/uL — ABNORMAL HIGH (ref 4.0–10.5)

## 2013-06-11 LAB — POCT I-STAT 3, ART BLOOD GAS (G3+)
ACID-BASE DEFICIT: 4 mmol/L — AB (ref 0.0–2.0)
Bicarbonate: 21.9 mEq/L (ref 20.0–24.0)
O2 SAT: 97 %
TCO2: 23 mmol/L (ref 0–100)
pCO2 arterial: 46 mmHg — ABNORMAL HIGH (ref 35.0–45.0)
pH, Arterial: 7.292 — ABNORMAL LOW (ref 7.350–7.450)
pO2, Arterial: 109 mmHg — ABNORMAL HIGH (ref 80.0–100.0)

## 2013-06-11 LAB — BASIC METABOLIC PANEL
BUN: 9 mg/dL (ref 6–23)
CO2: 21 mEq/L (ref 19–32)
Calcium: 7.3 mg/dL — ABNORMAL LOW (ref 8.4–10.5)
Chloride: 109 mEq/L (ref 96–112)
Creatinine, Ser: 0.85 mg/dL (ref 0.50–1.10)
GFR calc non Af Amer: >90 mL/min (ref >90)
Glucose, Bld: 94 mg/dL (ref 70–99)
POTASSIUM: 3.9 meq/L (ref 3.7–5.3)
Sodium: 143 mEq/L (ref 137–147)

## 2013-06-11 LAB — MAGNESIUM: Magnesium: 2.2 mg/dL (ref 1.5–2.5)

## 2013-06-11 LAB — GLUCOSE, CAPILLARY
GLUCOSE-CAPILLARY: 84 mg/dL (ref 70–99)
GLUCOSE-CAPILLARY: 97 mg/dL (ref 70–99)
Glucose-Capillary: 97 mg/dL (ref 70–99)
Glucose-Capillary: 86 mg/dL (ref 70–99)
Glucose-Capillary: 147 mg/dL — ABNORMAL HIGH (ref 70–99)
Glucose-Capillary: 95 mg/dL (ref 70–99)
Glucose-Capillary: 102 mg/dL — ABNORMAL HIGH (ref 70–99)

## 2013-06-11 LAB — PHOSPHORUS: PHOSPHORUS: 3.9 mg/dL (ref 2.3–4.6)

## 2013-06-11 LAB — PROTIME-INR
INR: 1.55 — AB (ref 0.00–1.49)
Prothrombin Time: 18.2 seconds — ABNORMAL HIGH (ref 11.6–15.2)

## 2013-06-11 LAB — PROCALCITONIN: Procalcitonin: 0.18 ng/mL

## 2013-06-11 MED ORDER — DEXTROSE-NACL 5-0.45 % IV SOLN: INTRAVENOUS | Status: DC

## 2013-06-11 MED ORDER — HYDROMORPHONE HCL PF 1 MG/ML IJ SOLN: 0.2500 mg | INTRAMUSCULAR | Status: DC | PRN

## 2013-06-11 MED ORDER — HEPARIN SODIUM (PORCINE) 5000 UNIT/ML IJ SOLN
5000.0000 [IU] | Freq: Three times a day (TID) | INTRAMUSCULAR | Status: DC
  Administered 2013-06-11 – 2013-06-16 (×15): 5000 [IU] via SUBCUTANEOUS
  Filled 2013-06-11 (×20): qty 1

## 2013-06-11 MED ORDER — FUROSEMIDE 10 MG/ML IJ SOLN
20.0000 mg | Freq: Four times a day (QID) | INTRAMUSCULAR | Status: AC
  Administered 2013-06-11 – 2013-06-12 (×3): 20 mg via INTRAVENOUS
  Filled 2013-06-11 (×3): qty 2

## 2013-06-11 MED ORDER — JEVITY 1.2 CAL PO LIQD
1000.0000 mL | ORAL | Status: DC
  Administered 2013-06-11: 1000 mL
  Filled 2013-06-11 (×3): qty 1000

## 2013-06-11 MED ORDER — POTASSIUM CHLORIDE 20 MEQ/15ML (10%) PO LIQD
40.0000 meq | Freq: Three times a day (TID) | ORAL | Status: AC
  Administered 2013-06-11 (×2): 40 meq
  Filled 2013-06-11 (×2): qty 30

## 2013-06-11 MED ORDER — MORPHINE SULFATE 2 MG/ML IJ SOLN
2.0000 mg | INTRAMUSCULAR | Status: DC | PRN
  Administered 2013-06-15: 4 mg via INTRAVENOUS
  Administered 2013-06-15 – 2013-06-16 (×4): 2 mg via INTRAVENOUS
  Administered 2013-06-16 (×4): 4 mg via INTRAVENOUS
  Administered 2013-06-16 (×2): 2 mg via INTRAVENOUS
  Administered 2013-06-17 – 2013-06-20 (×31): 4 mg via INTRAVENOUS
  Administered 2013-06-21: 2 mg via INTRAVENOUS
  Administered 2013-06-21: 4 mg via INTRAVENOUS
  Administered 2013-06-21: 2 mg via INTRAVENOUS
  Administered 2013-06-21 (×2): 4 mg via INTRAVENOUS
  Administered 2013-06-21: 2 mg via INTRAVENOUS
  Administered 2013-06-21 – 2013-06-23 (×12): 4 mg via INTRAVENOUS
  Administered 2013-06-23: 2 mg via INTRAVENOUS
  Administered 2013-06-23 – 2013-06-24 (×3): 4 mg via INTRAVENOUS
  Administered 2013-06-24: 2 mg via INTRAVENOUS
  Administered 2013-06-24 (×3): 4 mg via INTRAVENOUS
  Filled 2013-06-11 (×7): qty 2
  Filled 2013-06-11: qty 1
  Filled 2013-06-11: qty 2
  Filled 2013-06-11: qty 1
  Filled 2013-06-11 (×9): qty 2
  Filled 2013-06-11: qty 1
  Filled 2013-06-11 (×3): qty 2
  Filled 2013-06-11: qty 1
  Filled 2013-06-11: qty 2
  Filled 2013-06-11: qty 1
  Filled 2013-06-11 (×3): qty 2
  Filled 2013-06-11: qty 1
  Filled 2013-06-11 (×10): qty 2
  Filled 2013-06-11: qty 1
  Filled 2013-06-11 (×2): qty 2
  Filled 2013-06-11: qty 1
  Filled 2013-06-11 (×2): qty 2
  Filled 2013-06-11: qty 1
  Filled 2013-06-11: qty 2
  Filled 2013-06-11: qty 1
  Filled 2013-06-11 (×11): qty 2
  Filled 2013-06-11: qty 1
  Filled 2013-06-11: qty 2
  Filled 2013-06-11: qty 1
  Filled 2013-06-11 (×7): qty 2
  Filled 2013-06-11: qty 1
  Filled 2013-06-11: qty 2

## 2013-06-11 NOTE — Progress Notes (Signed)
Name: Kimberly May MRN: [id number] DOB: 04-25-1991    ADMISSION DATE:  06/09/2013 CONSULTATION DATE:  06/10/13  REFERRING MD :  Flo Shanks PRIMARY SERVICE: ENT  CHIEF COMPLAINT:  Post I&D of retropharyngeal abscess. Intubated.  BRIEF PATIENT DESCRIPTION:  23 years old female with no significant PMH. Underwent I&D of retropharyngeal abscess and possible early mediastinitis. Ot of the OR intubated. PCCM consult called for mechanical ventilation management.   SIGNIFICANT EVENTS / STUDIES:  06/09/13 -> I&D retropharyngeal abscess 1/31 changed to PCV 1/31  Ct chest IMPRESSION:  1. Bilateral small pleural effusions with associated airspace  disease which may represent compressive atelectasis versus  developing pneumonia.  2. Endotracheal tube with the tip 1 cm above the carina. Recommend  retracting the endotracheal tube 2 cm.  3. Surgical drain in the left paratracheal region extending into the  upper mediastinum.  4. Small pericardial effusion.  5. Splenomegaly.  LINES / TUBES: - Peripheral IV's 1-31 OTT>> 1/31 rt PICC>> CULTURES: - FEW GROUP A STREP (S.PYOGENES) ISOLATED FROM PRIOR I&D ON 1/25 1/31 ana>> 1/31 wound>>  ANTIBIOTICS: 1/30  Rocephin>> 1/30 Flagyl>> 1/31 cleocin>>   HISTORY OF PRESENT ILLNESS:   23 years old female with no significant PMH. Underwent I&D of retropharyngeal abscess and possible early mediastinitis. Out of the OR intubated. PCCM consult called for mechanical ventilation management. At the time of my examination the patient is intubated, sedated with propofol, hemodynamically stable on minimal vent settings.   SUBJECTIVE:  Sedated and now on PCV. VITAL SIGNS: Temp:  [99.8 F (37.7 C)-100.6 F (38.1 C)] 99.8 F (37.7 C) (02/01 0700) Pulse Rate:  [81-137] 121 (02/01 0845) Resp:  [0-30] 16 (02/01 0845) BP: (76-138)/(33-72) 108/62 mmHg (02/01 0845) SpO2:  [94 %-100 %] 96 % (02/01 0845) FiO2 (%):  [30 %-60 %] 50 % (02/01  0824) HEMODYNAMICS:   VENTILATOR SETTINGS: Vent Mode:  [-] PCV FiO2 (%):  [30 %-60 %] 50 % Set Rate:  [16 bmp-26 bmp] 16 bmp Vt Set:  [300 mL] 300 mL PEEP:  [10 cmH20] 10 cmH20 Plateau Pressure:  [24 cmH20-29 cmH20] 24 cmH20 INTAKE / OUTPUT: Intake/Output     01/31 0701 - 02/01 0700 02/01 0701 - 02/02 0700   I.V. (mL/kg) 2920.1 (30.8) 40 (0.4)   IV Piggyback 100    Total Intake(mL/kg) 3020.1 (31.9) 40 (0.4)   Urine (mL/kg/hr) 3810 (1.7) 100 (0.5)   Blood     Total Output 3810 100   Net -789.9 -60          PHYSICAL EXAMINATION: General: Sedated, intubated, no acute distress. Eyes: Anicteric sclerae. Pupils are pin point ENT: ETT in place. Trachea at midline. Neck dressing CDI Heart: Normal S1, S2. No murmurs, rubs, or gallops appreciated. No bruits, equal pulses. Lungs: Normal excursion, no dullness to percussion. Good air movement bilaterally, without wheezes or crackles.  Abdomen: Abdomen soft, non-tender and not distended, normoactive bowel sounds. No hepatosplenomegaly or masses. Musculoskeletal: No clubbing or synovitis. No LE edema Skin: No rashes or lesions Neuro: Patient is sedated, unresponsive or agitated   LABS:  CBC  Recent Labs Lab 06/10/13 1900 06/11/13 0500 06/11/13 0843  WBC 21.3* 15.0* 17.6*  HGB 9.1* 8.3* 9.2*  HCT 28.0* 25.2* 28.2*  PLT 261 219 247   Coag's  Recent Labs Lab 06/10/13 1900 06/11/13 0500  APTT 32  --   INR  --  1.55*   BMET  Recent Labs Lab 06/10/13 1350 06/10/13 1900 06/11/13 0500  NA  137 142 143  K 5.0 3.7 3.9  CL 103 106 109  CO2 22 22 21   BUN 16 13 9   CREATININE 1.43* 0.47* 0.85  GLUCOSE 79 102* 94   Electrolytes  Recent Labs Lab 06/05/13 0453  06/10/13 1350 06/10/13 1900 06/11/13 0500  CALCIUM 8.4  < > 7.0* 6.9* 7.3*  MG 2.3  --   --   --  2.2  PHOS 3.5  --   --   --  3.9  < > = values in this interval not displayed. Sepsis Markers  Recent Labs Lab 06/10/13 1900  LATICACIDVEN 0.5    ABG  Recent Labs Lab 06/10/13 1607 06/10/13 2144 06/11/13 0441  PHART 7.556* 7.412 7.292*  PCO2ART 25.3* 33.3* 46.0*  PO2ART 113.0* 133.0* 109.0*   Liver Enzymes  Recent Labs Lab 06/05/13 0453 06/09/13 1945 06/10/13 1900  AST 11 9 7   ALT 12 15 10   ALKPHOS 73 61 56  BILITOT 0.2* 0.4 0.6  ALBUMIN 2.6* 2.5* 2.0*   Cardiac Enzymes  Recent Labs Lab 06/10/13 1900  TROPONINI <0.30   Glucose  Recent Labs Lab 06/10/13 1135 06/10/13 1726 06/10/13 1942 06/10/13 2343 06/11/13 0344 06/11/13 0714  GLUCAP 91 87 92 97 97 86    Imaging Ct Soft Tissue Neck W Contrast  06/09/2013   CLINICAL DATA:  Adenoid ectomy 6 days ago.  Sore throat.  EXAM: CT NECK WITH CONTRAST  TECHNIQUE: Multidetector CT imaging of the neck was performed using the standard protocol following the bolus administration of intravenous contrast.  CONTRAST:  75mL OMNIPAQUE IOHEXOL 300 MG/ML  SOLN  COMPARISON:  06/04/2013  FINDINGS: The patient continues to show though inflammatory edematous change in the parapharyngeal space. Fluid/ edema in the retropharyngeal/prevertebral space measures up to 1.7 cm in thickness. The lateral retropharyngeal node on the left now shows central low density, measuring 17 mm in diameter and extending over a length of 2.7 cm. This is consistent with an abscess. The node on the right at this level is enlarged but not suppurative. Inflammation extends down as far as the thoracic inlet and may involve the superior mediastinum. Patient now has pleural fluid on the right, not seen previously.  No evidence of vascular thrombosis at this time. There are reactive level 2 lymph nodes on the left, the largest measuring 1.6 x 2.4 cm.  IMPRESSION: Persistence of soft tissue inflammation throughout the parapharyngeal space of the neck with pronounced prevertebral/ retropharyngeal fluid/edema. Suppuration of the lateral retropharyngeal node on the left consistent with abscess. Extension of the  inflammation to the thoracic inlet and upper mediastinum with development of pleural fluid on the right.   Electronically Signed   By: Paulina Fusi M.D.   On: 06/09/2013 19:53   Ct Chest Wo Contrast  06/10/2013   CLINICAL DATA:  Pulmonary infiltrates  EXAM: CT CHEST WITHOUT CONTRAST  TECHNIQUE: Multidetector CT imaging of the chest was performed following the standard protocol without IV contrast.  COMPARISON:  None.  FINDINGS: The central airways are patent. There are bilateral small pleural effusions. There is bibasilar airspace disease which may reflect compressive atelectasis versus developing pneumonia. There is no pneumothorax. There is an endotracheal to with the tip 1 cm above the carina.  There are no pathologically enlarged axillary, hilar or mediastinal lymph nodes. There is a surgical drain in the left paratracheal region extending into the upper mediastinum.  The heart size is normal. There is a small pericardial effusion. The thoracic aorta is normal in  caliber. There is a right-sided PICC line with the tip projecting over the proximal SVC.  Review of bone windows demonstrates no focal lytic or sclerotic lesions.  Limited non-contrast images of the upper abdomen were obtained. There is a nasogastric tube with the tip in the antrum of the stomach. There is nonspecific splenomegaly.  IMPRESSION: 1. Bilateral small pleural effusions with associated airspace disease which may represent compressive atelectasis versus developing pneumonia. 2. Endotracheal tube with the tip 1 cm above the carina. Recommend retracting the endotracheal tube 2 cm. 3. Surgical drain in the left paratracheal region extending into the upper mediastinum. 4. Small pericardial effusion. 5. Splenomegaly.   Electronically Signed   By: Elige KoHetal  Patel   On: 06/10/2013 18:28   Dg Chest Port 1 View  06/11/2013   CLINICAL DATA:  Endotracheal tube placement.  EXAM: PORTABLE CHEST - 1 VIEW  COMPARISON:  06/10/2013  FINDINGS: Endotracheal  tube tip projects 1.8 cm above the carina. This is stable.  New right PICC has its tip in the lower superior vena cava. Nasogastric tube is stable passing below the diaphragm into the stomach.  Hazy opacity in the lower lungs appears improved although this is most likely a technical change only. This is likely combination of effusions and atelectasis. No convincing pulmonary edema.  Cardiac silhouette is mildly enlarged.  No pneumothorax.  IMPRESSION: 1. Support apparatus is well positioned. New right PICC has its tip in the lower superior vena cava. 2. Lung base opacity appears improved although this change could be technical only. 3. No other change.  No new lung opacities.   Electronically Signed   By: Amie Portlandavid  Ormond M.D.   On: 06/11/2013 08:19   Dg Chest Port 1 View  06/10/2013   CLINICAL DATA:  Endotracheal tube positioning.  EXAM: PORTABLE CHEST - 1 VIEW  COMPARISON:  Chest radiograph June 10, 2013 at 4:44 a.m.  FINDINGS: Retracted endotracheal tube, distal tip now projects 2 cm above the carina. Nasogastric tube tip not imaged but past the proximal stomach. Slightly decreased interstitial and alveolar airspace opacities, with suspect at least small bilateral pleural effusion. Cardiomediastinal silhouette is predominately obscured by the parenchymal process. No pneumothorax. Soft tissue planes and included osseous structures are nonsuspicious.  IMPRESSION: Retracted endotracheal tube, distal tip now projects 2 cm above the carina without apparent change in position of the nasogastric tube.  Slightly decreased interstitial and alveolar airspace opacities with small pleural effusions may reflect pulmonary edema.   Electronically Signed   By: Awilda Metroourtnay  Bloomer   On: 06/10/2013 05:14   Dg Chest Portable 1 View  06/10/2013   ADDENDUM REPORT: 06/10/2013 05:11  ADDENDUM: Disregard the above report.  COMPARISON:  Chest radiograph June 10, 2013 at 2:05 a.m.  FINDINGS: Diffuse interstitial and alveolar  airspace opacities are similar, predominately obscuring the lung parenchyma. The endotracheal tube tip projects at the right mainstem bronchus. Nasogastric tube tip is not imaged though, past the gastroesophageal junction. No pneumothorax.  Multiple EKG lines overlie the patient and may obscure subtle underlying pathology. Soft tissue planes and included osseous structures are unremarkable.  IMPRESSION: 1. Endotracheal tube tip projects at right mainstem bronchus, recommend 1-2 cm of retraction. No apparent change in positioning of nasogastric tube. 2. Diffuse interstitial and alveolar airspace opacities may reflect pulmonary edema or even ARDS.   Electronically Signed   By: Awilda Metroourtnay  Bloomer   On: 06/10/2013 05:11   06/10/2013   CLINICAL DATA:  Oxygen desaturation, intubation.  EXAM: PORTABLE CHEST -  1 VIEW  COMPARISON:  Chest radiograph June 10, 2013 at 2:05 a.m.  FINDINGS: The heart size and mediastinal contours are within normal limits. Both lungs are clear. The visualized skeletal structures are unremarkable.  IMPRESSION: No active disease.  Electronically Signed: By: Awilda Metro On: 06/10/2013 05:01   Portable Chest Xray  06/10/2013   CLINICAL DATA:  Endotracheal tube placement  EXAM: PORTABLE CHEST - 1 VIEW  COMPARISON:  DG CHEST 2 VIEW dated 06/04/2013; CT NECK W/CM dated 06/04/2013; CT NECK W/CM dated 06/09/2013  FINDINGS: Interval intubation endotracheal to approximately 12 mm from carina. Consider retraction by 1 to 2 cm. NG tube extends the stomach. There is new diffuse airspace disease suggesting pulmonary edema. Left lower lobe atelectasis. New surgical drain in the left neck.  IMPRESSION: 1. Interval intubation. Endotracheal tube is 1.2 cm from carina. Consider retraction by 1-2 cm. 2. New pulmonary edema. 3. Surgical drain in the left neck   Electronically Signed   By: Genevive Bi M.D.   On: 06/10/2013 02:38   ASSESSMENT / PLAN:  PULMONARY A: 1) Intubated post I&D of  retropharyngeal abscess. P:   - Mechanical ventilation. - Change to PCV. 2/1 changed to 18/10 with abg pending - F/U ABG. - Chest CT without contrast to evaluate pleural effusion.Noted to be small - CXR and ABG in AM.  CARDIOVASCULAR A:  1) Hemodynamically fluctuant at this point, needs central access that is in the SVC.  P:  - ICU monitoring - PICC line placement 1/31 - Follow CVP and may need sepsis protocol if remains hypotensive but so far responding to IVF. -Check procalcitonin 2/1` RENAL  Recent Labs Lab 06/10/13 1350 06/10/13 1900 06/11/13 0500  K 5.0 3.7 3.9     A:  Hyperkalemia(resolved) 1)  P:   - Will follow chemistry. - Aggressive hydration.  GASTROINTESTINAL A:   1) No issues P:   - GI prophylaxis with protonix. - Consult nutrition for TF as per nutrition. I  HEMATOLOGIC A:   1) No issues P:  - Will follow CBC.  INFECTIOUS A:   1) Retropharyngeal abscess 2) Post I&D P:   See flows  ENDOCRINE A:   1) No issues   NEUROLOGIC A:   1) Intubated, sedated P:   - Sedate for vent tolerance  Brett Canales Minor ACNP [facility] PCCM Pager [phone] till 3 pm If no answer page [phone] 06/11/2013, 9:13 AM  Much more comfortable with PCV, vent adjusted for ABG.  Continue recruitment.  Will diurese today and hopefully will be able to begin weaning trials in AM.  Pleural effusion on CT is not large enough to warrant a tap.  PNA present on CT explaining the haziness however.  The spleen is enlarged but can be worked up after the acute phase has resolved.  CC time 35 min.  Patient seen and examined, agree with above note.  I dictated the care and orders written for this patient under my direction.  Alyson Reedy, MD [phone]

## 2013-06-11 NOTE — Progress Notes (Signed)
06/11/2013 11:10 AM  Kimberly May, Kimberly May [id number]  Post-Op Day 2    Temp:  [99.8 F (37.7 C)-100.6 F (38.1 C)] 99.8 F (37.7 C) (02/01 0700) Pulse Rate:  [81-137] 121 (02/01 0845) Resp:  [0-30] 16 (02/01 0845) BP: (76-138)/(33-72) 108/62 mmHg (02/01 0845) SpO2:  [94 %-100 %] 96 % (02/01 0845) FiO2 (%):  [30 %-60 %] 50 % (02/01 0824),     Intake/Output Summary (Last 24 hours) at 06/11/13 1110 Last data filed at 06/11/13 0800  Gross per 24 hour  Intake 2677.06 ml  Output   3685 ml  Net -1007.94 ml    Results for orders placed during the hospital encounter of 06/09/13 (from the past 24 hour(s))  GLUCOSE, CAPILLARY     Status: None   Collection Time    06/10/13 11:35 AM      Result Value Range   Glucose-Capillary 91  70 - 99 mg/dL  BASIC METABOLIC PANEL     Status: Abnormal   Collection Time    06/10/13  1:50 PM      Result Value Range   Sodium 137  137 - 147 mEq/L   Potassium 5.0  3.7 - 5.3 mEq/L   Chloride 103  96 - 112 mEq/L   CO2 22  19 - 32 mEq/L   Glucose, Bld 79  70 - 99 mg/dL   BUN 16  6 - 23 mg/dL   Creatinine, Ser 8.29 (*) 0.50 - 1.10 mg/dL   Calcium 7.0 (*) 8.4 - 10.5 mg/dL   GFR calc non Af Amer 51 (*) >90 mL/min   GFR calc Af Amer 60 (*) >90 mL/min  POCT I-STAT 3, BLOOD GAS (G3+)     Status: Abnormal   Collection Time    06/10/13  4:07 PM      Result Value Range   pH, Arterial 7.556 (*) 7.350 - 7.450   pCO2 arterial 25.3 (*) 35.0 - 45.0 mmHg   pO2, Arterial 113.0 (*) 80.0 - 100.0 mmHg   Bicarbonate 22.4  20.0 - 24.0 mEq/L   TCO2 23  0 - 100 mmol/L   O2 Saturation 99.0     Acid-Base Excess 1.0  0.0 - 2.0 mmol/L   Patient temperature 98.8 F     Collection site RADIAL, ALLEN'S TEST ACCEPTABLE     Drawn by Operator     Sample type ARTERIAL    GLUCOSE, CAPILLARY     Status: None   Collection Time    06/10/13  5:26 PM      Result Value Range   Glucose-Capillary 87  70 - 99 mg/dL  TYPE AND SCREEN     Status: None   Collection Time    06/10/13  6:56  PM      Result Value Range   ABO/RH(D) A POS     Antibody Screen NEG     Sample Expiration 06/13/2013    ABO/RH     Status: None   Collection Time    06/10/13  6:56 PM      Result Value Range   ABO/RH(D) A POS    CBC     Status: Abnormal   Collection Time    06/10/13  7:00 PM      Result Value Range   WBC 21.3 (*) 4.0 - 10.5 K/uL   RBC 3.30 (*) 3.87 - 5.11 MIL/uL   Hemoglobin 9.1 (*) 12.0 - 15.0 g/dL   HCT 56.2 (*) 13.0 - 86.5 %   MCV 84.8  78.0 - 100.0 fL   MCH 27.6  26.0 - 34.0 pg   MCHC 32.5  30.0 - 36.0 g/dL   RDW 30.813.4  65.711.5 - 84.615.5 %   Platelets 261  150 - 400 K/uL  COMPREHENSIVE METABOLIC PANEL     Status: Abnormal   Collection Time    06/10/13  7:00 PM      Result Value Range   Sodium 142  137 - 147 mEq/L   Potassium 3.7  3.7 - 5.3 mEq/L   Chloride 106  96 - 112 mEq/L   CO2 22  19 - 32 mEq/L   Glucose, Bld 102 (*) 70 - 99 mg/dL   BUN 13  6 - 23 mg/dL   Creatinine, Ser 9.620.47 (*) 0.50 - 1.10 mg/dL   Calcium 6.9 (*) 8.4 - 10.5 mg/dL   Total Protein 6.3  6.0 - 8.3 g/dL   Albumin 2.0 (*) 3.5 - 5.2 g/dL   AST 7  0 - 37 U/L   ALT 10  0 - 35 U/L   Alkaline Phosphatase 56  39 - 117 U/L   Total Bilirubin 0.6  0.3 - 1.2 mg/dL   GFR calc non Af Amer >90  >90 mL/min   GFR calc Af Amer >90  >90 mL/min  LACTIC ACID, PLASMA     Status: None   Collection Time    06/10/13  7:00 PM      Result Value Range   Lactic Acid, Venous 0.5  0.5 - 2.2 mmol/L  CORTISOL     Status: None   Collection Time    06/10/13  7:00 PM      Result Value Range   Cortisol, Plasma 13.7    TROPONIN I     Status: None   Collection Time    06/10/13  7:00 PM      Result Value Range   Troponin I <0.30  <0.30 ng/mL  APTT     Status: None   Collection Time    06/10/13  7:00 PM      Result Value Range   aPTT 32  24 - 37 seconds  FIBRINOGEN     Status: Abnormal   Collection Time    06/10/13  7:00 PM      Result Value Range   Fibrinogen 536 (*) 204 - 475 mg/dL  URINALYSIS, ROUTINE W REFLEX  MICROSCOPIC     Status: Abnormal   Collection Time    06/10/13  7:04 PM      Result Value Range   Color, Urine YELLOW  YELLOW   APPearance CLOUDY (*) CLEAR   Specific Gravity, Urine 1.014  1.005 - 1.030   pH 5.0  5.0 - 8.0   Glucose, UA NEGATIVE  NEGATIVE mg/dL   Hgb urine dipstick MODERATE (*) NEGATIVE   Bilirubin Urine NEGATIVE  NEGATIVE   Ketones, ur 15 (*) NEGATIVE mg/dL   Protein, ur NEGATIVE  NEGATIVE mg/dL   Urobilinogen, UA 0.2  0.0 - 1.0 mg/dL   Nitrite NEGATIVE  NEGATIVE   Leukocytes, UA TRACE (*) NEGATIVE  URINE MICROSCOPIC-ADD ON     Status: Abnormal   Collection Time    06/10/13  7:04 PM      Result Value Range   Squamous Epithelial / LPF FEW (*) RARE   WBC, UA 0-2  <3 WBC/hpf   RBC / HPF 3-6  <3 RBC/hpf  GLUCOSE, CAPILLARY     Status: None   Collection Time    06/10/13  7:42 PM      Result Value Range   Glucose-Capillary 92  70 - 99 mg/dL  POCT I-STAT 3, BLOOD GAS (G3+)     Status: Abnormal   Collection Time    06/10/13  9:44 PM      Result Value Range   pH, Arterial 7.412  7.350 - 7.450   pCO2 arterial 33.3 (*) 35.0 - 45.0 mmHg   pO2, Arterial 133.0 (*) 80.0 - 100.0 mmHg   Bicarbonate 21.2  20.0 - 24.0 mEq/L   TCO2 22  0 - 100 mmol/L   O2 Saturation 99.0     Acid-base deficit 3.0 (*) 0.0 - 2.0 mmol/L   Patient temperature 98.6 F     Collection site RADIAL, ALLEN'S TEST ACCEPTABLE     Drawn by Operator     Sample type ARTERIAL    GLUCOSE, CAPILLARY     Status: None   Collection Time    06/10/13 11:43 PM      Result Value Range   Glucose-Capillary 97  70 - 99 mg/dL  GLUCOSE, CAPILLARY     Status: None   Collection Time    06/11/13  3:44 AM      Result Value Range   Glucose-Capillary 97  70 - 99 mg/dL  POCT I-STAT 3, BLOOD GAS (G3+)     Status: Abnormal   Collection Time    06/11/13  4:41 AM      Result Value Range   pH, Arterial 7.292 (*) 7.350 - 7.450   pCO2 arterial 46.0 (*) 35.0 - 45.0 mmHg   pO2, Arterial 109.0 (*) 80.0 - 100.0 mmHg    Bicarbonate 21.9  20.0 - 24.0 mEq/L   TCO2 23  0 - 100 mmol/L   O2 Saturation 97.0     Acid-base deficit 4.0 (*) 0.0 - 2.0 mmol/L   Patient temperature 100.9 F     Collection site BRACHIAL ARTERY     Drawn by RT     Sample type ARTERIAL    CBC WITH DIFFERENTIAL     Status: Abnormal   Collection Time    06/11/13  5:00 AM      Result Value Range   WBC 15.0 (*) 4.0 - 10.5 K/uL   RBC 2.99 (*) 3.87 - 5.11 MIL/uL   Hemoglobin 8.3 (*) 12.0 - 15.0 g/dL   HCT 16.1 (*) 09.6 - 04.5 %   MCV 84.3  78.0 - 100.0 fL   MCH 27.8  26.0 - 34.0 pg   MCHC 32.9  30.0 - 36.0 g/dL   RDW 40.9  81.1 - 91.4 %   Platelets 219  150 - 400 K/uL   Neutrophils Relative % 75  43 - 77 %   Neutro Abs 11.3 (*) 1.7 - 7.7 K/uL   Lymphocytes Relative 14  12 - 46 %   Lymphs Abs 2.1  0.7 - 4.0 K/uL   Monocytes Relative 10  3 - 12 %   Monocytes Absolute 1.5 (*) 0.1 - 1.0 K/uL   Eosinophils Relative 1  0 - 5 %   Eosinophils Absolute 0.1  0.0 - 0.7 K/uL   Basophils Relative 0  0 - 1 %   Basophils Absolute 0.0  0.0 - 0.1 K/uL  BASIC METABOLIC PANEL     Status: Abnormal   Collection Time    06/11/13  5:00 AM      Result Value Range   Sodium 143  137 - 147 mEq/L   Potassium 3.9  3.7 - 5.3 mEq/L   Chloride 109  96 - 112 mEq/L   CO2 21  19 - 32 mEq/L   Glucose, Bld 94  70 - 99 mg/dL   BUN 9  6 - 23 mg/dL   Creatinine, Ser 1.61  0.50 - 1.10 mg/dL   Calcium 7.3 (*) 8.4 - 10.5 mg/dL   GFR calc non Af Amer >90  >90 mL/min   GFR calc Af Amer >90  >90 mL/min  PHOSPHORUS     Status: None   Collection Time    06/11/13  5:00 AM      Result Value Range   Phosphorus 3.9  2.3 - 4.6 mg/dL  PROTIME-INR     Status: Abnormal   Collection Time    06/11/13  5:00 AM      Result Value Range   Prothrombin Time 18.2 (*) 11.6 - 15.2 seconds   INR 1.55 (*) 0.00 - 1.49  MAGNESIUM     Status: None   Collection Time    06/11/13  5:00 AM      Result Value Range   Magnesium 2.2  1.5 - 2.5 mg/dL  GLUCOSE, CAPILLARY     Status: None    Collection Time    06/11/13  7:14 AM      Result Value Range   Glucose-Capillary 86  70 - 99 mg/dL  CBC     Status: Abnormal   Collection Time    06/11/13  8:43 AM      Result Value Range   WBC 17.6 (*) 4.0 - 10.5 K/uL   RBC 3.33 (*) 3.87 - 5.11 MIL/uL   Hemoglobin 9.2 (*) 12.0 - 15.0 g/dL   HCT 09.6 (*) 04.5 - 40.9 %   MCV 84.7  78.0 - 100.0 fL   MCH 27.6  26.0 - 34.0 pg   MCHC 32.6  30.0 - 36.0 g/dL   RDW 81.1  91.4 - 78.2 %   Platelets 247  150 - 400 K/uL    SUBJECTIVE:  Some awake.  Reports overall feels better.  OBJECTIVE:  Responds to commands.  LEFT neck swelling perhaps some better.  RIGHT neck obese, ?swollen.  Some RIGHT tenderness, reportedly no worse.  Drains secure with less drainage, all serous.    IMPRESSION:  Pleural effusion and pulm edema. WBC up slightly, Mild anemia.  Cannot easily assess neck but not clearly worse.  PLAN:  Will talk with Critical care about plans for extubation.  Will do flex laryngscopy prior to extubation.  Await cultures.  May need repeat CT neck.  Appreciate Critical Care assistance with overall management and esp lung/chest disease.    Flo Shanks

## 2013-06-11 NOTE — Progress Notes (Addendum)
[facility]  Date of Admission:  06/09/2013  Antibiotics: Antibiotics Given (last 72 hours)   Date/Time Action Medication Dose Rate   06/10/13 0011 Given   clindamycin (CLEOCIN) IVPB 900 mg 900 mg    06/10/13 0244 Given   cefTRIAXone (ROCEPHIN) 1 g in dextrose 5 % 50 mL IVPB 1 g 100 mL/hr   06/10/13 0531 Given   metroNIDAZOLE (FLAGYL) IVPB 500 mg 500 mg 100 mL/hr   06/10/13 1201 Given   metroNIDAZOLE (FLAGYL) IVPB 500 mg 500 mg 100 mL/hr   06/10/13 2100 Given   metroNIDAZOLE (FLAGYL) IVPB 500 mg 500 mg 100 mL/hr   06/10/13 2300 Given   cefTRIAXone (ROCEPHIN) 1 g in dextrose 5 % 50 mL IVPB 1 g 100 mL/hr   06/11/13 0500 Given   metroNIDAZOLE (FLAGYL) IVPB 500 mg 500 mg 100 mL/hr   06/11/13 1121 Given   metroNIDAZOLE (FLAGYL) IVPB 500 mg 500 mg 100 mL/hr      Subjective: Remains intubated  Objective: Temp:  [98.1 F (36.7 C)-100.6 F (38.1 C)] 98.1 F (36.7 C) (02/01 1136) Pulse Rate:  [81-137] 117 (02/01 1115) Resp:  [0-26] 12 (02/01 1115) BP: (76-138)/(33-72) 124/69 mmHg (02/01 1115) SpO2:  [94 %-100 %] 98 % (02/01 1115) FiO2 (%):  [30 %-50 %] 50 % (02/01 1208)  General: intubated, sedated Skin: no rashes Lungs: CTA B, anterior exam Cor: RRR without m Abdomen: soft, mild distension, + bowel sounds normoactive Ext: no edema Lines: right picc upper arm c/d/i  Lab Results Lab Results  Component Value Date   WBC 17.6* 06/11/2013   HGB 9.2* 06/11/2013   HCT 28.2* 06/11/2013   MCV 84.7 06/11/2013   PLT 247 06/11/2013    Lab Results  Component Value Date   CREATININE 0.85 06/11/2013   BUN 9 06/11/2013   NA 143 06/11/2013   K 3.9 06/11/2013   CL 109 06/11/2013   CO2 21 06/11/2013    Lab Results  Component Value Date   ALT 10 06/10/2013   AST 7 06/10/2013   ALKPHOS 56 06/10/2013   BILITOT 0.6 06/10/2013      Microbiology: Recent Results (from the past 240 hour(s))  CULTURE, ROUTINE-ABSCESS     Status: None   Collection Time    06/04/13  1:02 PM        Result Value Range Status   Specimen Description ABSCESS   Final   Special Requests     Final   Value: PATIENT ON FOLLOWING CLINDAMYCIN RETROPHARYNGEAL ABSCESS   Gram Stain     Final   Value: MODERATE WBC PRESENT, PREDOMINANTLY PMN     RARE SQUAMOUS EPITHELIAL CELLS PRESENT     ABUNDANT GRAM POSITIVE COCCI IN PAIRS     IN CLUSTERS MODERATE GRAM NEGATIVE RODS     FEW GRAM NEGATIVE COCCI   Culture     Final   Value: FEW GROUP A STREP (S.PYOGENES) ISOLATED     Performed at [facility]   Report Status 06/07/2013 FINAL   Final  ANAEROBIC CULTURE     Status: None   Collection Time    06/04/13  1:02 PM      Result Value Range Status   Specimen Description ABSCESS   Final   Special Requests     Final   Value: PATIENT ON FOLLOWING CLINDAMYCIN RETRO PHARYNGEAL ABSCESS   Gram Stain     Final   Value: FEW WBC PRESENT, PREDOMINANTLY PMN  FEW SQUAMOUS EPITHELIAL CELLS PRESENT     FEW GRAM POSITIVE COCCI IN PAIRS     IN CLUSTERS     Performed at [facility]   Culture     Final   Value: NO ANAEROBES ISOLATED; CULTURE IN PROGRESS FOR 5 DAYS     Performed at [facility]   Report Status PENDING   Incomplete  ANAEROBIC CULTURE     Status: None   Collection Time    06/10/13 12:41 AM      Result Value Range Status   Specimen Description ABSCESS LEFT NECK   Final   Special Requests     Final   Value: LEFT RETROPHARYNGEAL PATIENT ON FOLLOWING ZITHROMAX   Gram Stain PENDING   Incomplete   Culture     Final   Value: NO ANAEROBES ISOLATED; CULTURE IN PROGRESS FOR 5 DAYS     Performed at [facility]   Report Status PENDING   Incomplete  MRSA PCR SCREENING     Status: None   Collection Time    06/10/13  1:46 AM      Result Value Range Status   MRSA by PCR NEGATIVE  NEGATIVE Final   Comment:            The GeneXpert MRSA Assay (FDA     approved for NASAL specimens     only), is one component of a     comprehensive MRSA colonization     surveillance  program. It is not     intended to diagnose MRSA     infection nor to guide or     monitor treatment for     MRSA infections.    Studies/Results: Ct Soft Tissue Neck W Contrast  06/09/2013   CLINICAL DATA:  Adenoid ectomy 6 days ago.  Sore throat.  EXAM: CT NECK WITH CONTRAST  TECHNIQUE: Multidetector CT imaging of the neck was performed using the standard protocol following the bolus administration of intravenous contrast.  CONTRAST:  75mL OMNIPAQUE IOHEXOL 300 MG/ML  SOLN  COMPARISON:  06/04/2013  FINDINGS: The patient continues to show though inflammatory edematous change in the parapharyngeal space. Fluid/ edema in the retropharyngeal/prevertebral space measures up to 1.7 cm in thickness. The lateral retropharyngeal node on the left now shows central low density, measuring 17 mm in diameter and extending over a length of 2.7 cm. This is consistent with an abscess. The node on the right at this level is enlarged but not suppurative. Inflammation extends down as far as the thoracic inlet and may involve the superior mediastinum. Patient now has pleural fluid on the right, not seen previously.  No evidence of vascular thrombosis at this time. There are reactive level 2 lymph nodes on the left, the largest measuring 1.6 x 2.4 cm.  IMPRESSION: Persistence of soft tissue inflammation throughout the parapharyngeal space of the neck with pronounced prevertebral/ retropharyngeal fluid/edema. Suppuration of the lateral retropharyngeal node on the left consistent with abscess. Extension of the inflammation to the thoracic inlet and upper mediastinum with development of pleural fluid on the right.   Electronically Signed   By: Paulina Fusi M.D.   On: 06/09/2013 19:53   Ct Chest Wo Contrast  06/10/2013   CLINICAL DATA:  Pulmonary infiltrates  EXAM: CT CHEST WITHOUT CONTRAST  TECHNIQUE: Multidetector CT imaging of the chest was performed following the standard protocol without IV contrast.  COMPARISON:  None.   FINDINGS: The central airways are patent. There are bilateral  small pleural effusions. There is bibasilar airspace disease which may reflect compressive atelectasis versus developing pneumonia. There is no pneumothorax. There is an endotracheal to with the tip 1 cm above the carina.  There are no pathologically enlarged axillary, hilar or mediastinal lymph nodes. There is a surgical drain in the left paratracheal region extending into the upper mediastinum.  The heart size is normal. There is a small pericardial effusion. The thoracic aorta is normal in caliber. There is a right-sided PICC line with the tip projecting over the proximal SVC.  Review of bone windows demonstrates no focal lytic or sclerotic lesions.  Limited non-contrast images of the upper abdomen were obtained. There is a nasogastric tube with the tip in the antrum of the stomach. There is nonspecific splenomegaly.  IMPRESSION: 1. Bilateral small pleural effusions with associated airspace disease which may represent compressive atelectasis versus developing pneumonia. 2. Endotracheal tube with the tip 1 cm above the carina. Recommend retracting the endotracheal tube 2 cm. 3. Surgical drain in the left paratracheal region extending into the upper mediastinum. 4. Small pericardial effusion. 5. Splenomegaly.   Electronically Signed   By: Elige KoHetal  Patel   On: 06/10/2013 18:28   Dg Chest Port 1 View  06/11/2013   CLINICAL DATA:  Endotracheal tube placement.  EXAM: PORTABLE CHEST - 1 VIEW  COMPARISON:  06/10/2013  FINDINGS: Endotracheal tube tip projects 1.8 cm above the carina. This is stable.  New right PICC has its tip in the lower superior vena cava. Nasogastric tube is stable passing below the diaphragm into the stomach.  Hazy opacity in the lower lungs appears improved although this is most likely a technical change only. This is likely combination of effusions and atelectasis. No convincing pulmonary edema.  Cardiac silhouette is mildly enlarged.   No pneumothorax.  IMPRESSION: 1. Support apparatus is well positioned. New right PICC has its tip in the lower superior vena cava. 2. Lung base opacity appears improved although this change could be technical only. 3. No other change.  No new lung opacities.   Electronically Signed   By: Amie Portlandavid  Ormond M.D.   On: 06/11/2013 08:19   Dg Chest Port 1 View  06/10/2013   CLINICAL DATA:  Endotracheal tube positioning.  EXAM: PORTABLE CHEST - 1 VIEW  COMPARISON:  Chest radiograph June 10, 2013 at 4:44 a.m.  FINDINGS: Retracted endotracheal tube, distal tip now projects 2 cm above the carina. Nasogastric tube tip not imaged but past the proximal stomach. Slightly decreased interstitial and alveolar airspace opacities, with suspect at least small bilateral pleural effusion. Cardiomediastinal silhouette is predominately obscured by the parenchymal process. No pneumothorax. Soft tissue planes and included osseous structures are nonsuspicious.  IMPRESSION: Retracted endotracheal tube, distal tip now projects 2 cm above the carina without apparent change in position of the nasogastric tube.  Slightly decreased interstitial and alveolar airspace opacities with small pleural effusions may reflect pulmonary edema.   Electronically Signed   By: Awilda Metroourtnay  Bloomer   On: 06/10/2013 05:14   Dg Chest Portable 1 View  06/10/2013   ADDENDUM REPORT: 06/10/2013 05:11  ADDENDUM: Disregard the above report.  COMPARISON:  Chest radiograph June 10, 2013 at 2:05 a.m.  FINDINGS: Diffuse interstitial and alveolar airspace opacities are similar, predominately obscuring the lung parenchyma. The endotracheal tube tip projects at the right mainstem bronchus. Nasogastric tube tip is not imaged though, past the gastroesophageal junction. No pneumothorax.  Multiple EKG lines overlie the patient and may obscure subtle underlying pathology. Soft  tissue planes and included osseous structures are unremarkable.  IMPRESSION: 1. Endotracheal tube tip  projects at right mainstem bronchus, recommend 1-2 cm of retraction. No apparent change in positioning of nasogastric tube. 2. Diffuse interstitial and alveolar airspace opacities may reflect pulmonary edema or even ARDS.   Electronically Signed   By: Awilda Metro   On: 06/10/2013 05:11   06/10/2013   CLINICAL DATA:  Oxygen desaturation, intubation.  EXAM: PORTABLE CHEST - 1 VIEW  COMPARISON:  Chest radiograph June 10, 2013 at 2:05 a.m.  FINDINGS: The heart size and mediastinal contours are within normal limits. Both lungs are clear. The visualized skeletal structures are unremarkable.  IMPRESSION: No active disease.  Electronically Signed: By: Awilda Metro On: 06/10/2013 05:01   Portable Chest Xray  06/10/2013   CLINICAL DATA:  Endotracheal tube placement  EXAM: PORTABLE CHEST - 1 VIEW  COMPARISON:  DG CHEST 2 VIEW dated 06/04/2013; CT NECK W/CM dated 06/04/2013; CT NECK W/CM dated 06/09/2013  FINDINGS: Interval intubation endotracheal to approximately 12 mm from carina. Consider retraction by 1 to 2 cm. NG tube extends the stomach. There is new diffuse airspace disease suggesting pulmonary edema. Left lower lobe atelectasis. New surgical drain in the left neck.  IMPRESSION: 1. Interval intubation. Endotracheal tube is 1.2 cm from carina. Consider retraction by 1-2 cm. 2. New pulmonary edema. 3. Surgical drain in the left neck   Electronically Signed   By: Genevive Bi M.D.   On: 06/10/2013 02:38    Assessment/Plan: 1) retropharyngeal abscess - culture from 1/25 OR sample with GAS.  WBC improved. Culture from 1/31 ngtd.  On flagyl with ceftriaxone.  Continue with same pending culture.    Dr. Orvan Falconer to follow up tomorrow.    Staci Righter, MD [facility] [email or web address] [phone] pager   [phone] cell 06/11/2013, 12:32 PM

## 2013-06-12 ENCOUNTER — Inpatient Hospital Stay (HOSPITAL_COMMUNITY): Payer: BC Managed Care – PPO

## 2013-06-12 ENCOUNTER — Encounter (HOSPITAL_COMMUNITY): Payer: Self-pay | Admitting: Otolaryngology

## 2013-06-12 DIAGNOSIS — N179 Acute kidney failure, unspecified: Secondary | ICD-10-CM

## 2013-06-12 DIAGNOSIS — J390 Retropharyngeal and parapharyngeal abscess: Principal | ICD-10-CM

## 2013-06-12 DIAGNOSIS — J9600 Acute respiratory failure, unspecified whether with hypoxia or hypercapnia: Secondary | ICD-10-CM

## 2013-06-12 DIAGNOSIS — J351 Hypertrophy of tonsils: Secondary | ICD-10-CM

## 2013-06-12 DIAGNOSIS — B950 Streptococcus, group A, as the cause of diseases classified elsewhere: Secondary | ICD-10-CM

## 2013-06-12 DIAGNOSIS — H6990 Unspecified Eustachian tube disorder, unspecified ear: Secondary | ICD-10-CM

## 2013-06-12 DIAGNOSIS — H6980 Other specified disorders of Eustachian tube, unspecified ear: Secondary | ICD-10-CM

## 2013-06-12 LAB — BASIC METABOLIC PANEL
BUN: 13 mg/dL (ref 6–23)
CO2: 22 mEq/L (ref 19–32)
Calcium: 6.8 mg/dL — ABNORMAL LOW (ref 8.4–10.5)
Chloride: 110 mEq/L (ref 96–112)
Creatinine, Ser: 0.77 mg/dL (ref 0.50–1.10)
GFR calc Af Amer: >90 mL/min (ref >90)
GFR calc non Af Amer: >90 mL/min (ref >90)
GLUCOSE: 93 mg/dL (ref 70–99)
POTASSIUM: 3.8 meq/L (ref 3.7–5.3)
Sodium: 142 mEq/L (ref 137–147)

## 2013-06-12 LAB — CULTURE, ROUTINE-ABSCESS

## 2013-06-12 LAB — CBC
HCT: 25 % — ABNORMAL LOW (ref 36.0–46.0)
HEMATOCRIT: 21.2 % — AB (ref 36.0–46.0)
HEMOGLOBIN: 7 g/dL — AB (ref 12.0–15.0)
Hemoglobin: 7.9 g/dL — ABNORMAL LOW (ref 12.0–15.0)
MCH: 28.1 pg (ref 26.0–34.0)
MCH: 27.1 pg (ref 26.0–34.0)
MCHC: 33 g/dL (ref 30.0–36.0)
MCHC: 31.6 g/dL (ref 30.0–36.0)
MCV: 85.1 fL (ref 78.0–100.0)
MCV: 85.9 fL (ref 78.0–100.0)
PLATELETS: 228 10*3/uL (ref 150–400)
Platelets: 188 10*3/uL (ref 150–400)
RBC: 2.49 MIL/uL — ABNORMAL LOW (ref 3.87–5.11)
RBC: 2.91 MIL/uL — ABNORMAL LOW (ref 3.87–5.11)
RDW: 13.6 % (ref 11.5–15.5)
RDW: 13.7 % (ref 11.5–15.5)
WBC: 9.8 10*3/uL (ref 4.0–10.5)
WBC: 10.6 10*3/uL — AB (ref 4.0–10.5)

## 2013-06-12 LAB — PROCALCITONIN: Procalcitonin: 0.1 ng/mL

## 2013-06-12 LAB — GLUCOSE, CAPILLARY
GLUCOSE-CAPILLARY: 105 mg/dL — AB (ref 70–99)
Glucose-Capillary: 101 mg/dL — ABNORMAL HIGH (ref 70–99)
Glucose-Capillary: 102 mg/dL — ABNORMAL HIGH (ref 70–99)
Glucose-Capillary: 105 mg/dL — ABNORMAL HIGH (ref 70–99)
Glucose-Capillary: 109 mg/dL — ABNORMAL HIGH (ref 70–99)
Glucose-Capillary: 127 mg/dL — ABNORMAL HIGH (ref 70–99)

## 2013-06-12 LAB — URINE CULTURE
Colony Count: NO GROWTH
Culture: NO GROWTH

## 2013-06-12 MED ORDER — PRO-STAT SUGAR FREE PO LIQD
30.0000 mL | Freq: Four times a day (QID) | ORAL | Status: DC
  Administered 2013-06-12 – 2013-06-14 (×8): 30 mL
  Filled 2013-06-12 (×31): qty 30

## 2013-06-12 MED ORDER — PANTOPRAZOLE SODIUM 40 MG PO PACK
40.0000 mg | PACK | Freq: Every day | ORAL | Status: DC
  Administered 2013-06-13 – 2013-06-14 (×2): 40 mg
  Filled 2013-06-12 (×3): qty 20

## 2013-06-12 MED ORDER — VITAL HIGH PROTEIN PO LIQD
1000.0000 mL | ORAL | Status: DC
  Administered 2013-06-12: 1000 mL
  Filled 2013-06-12 (×9): qty 1000

## 2013-06-12 MED ORDER — LIDOCAINE HCL (PF) 1 % IJ SOLN
INTRAMUSCULAR | Status: AC
  Filled 2013-06-12: qty 5

## 2013-06-12 MED ORDER — FUROSEMIDE 10 MG/ML IJ SOLN
40.0000 mg | Freq: Two times a day (BID) | INTRAMUSCULAR | Status: AC
  Administered 2013-06-12 – 2013-06-14 (×4): 40 mg via INTRAVENOUS
  Filled 2013-06-12 (×4): qty 4

## 2013-06-12 MED ORDER — LIDOCAINE VISCOUS 2 % MT SOLN
15.0000 mL | Freq: Once | OROMUCOSAL | Status: DC
  Filled 2013-06-12: qty 15

## 2013-06-12 MED ORDER — IOHEXOL 350 MG/ML SOLN
100.0000 mL | Freq: Once | INTRAVENOUS | Status: AC | PRN
  Administered 2013-06-12: 100 mL via INTRAVENOUS

## 2013-06-12 NOTE — Progress Notes (Signed)
NUTRITION FOLLOW UP  Intervention:    Continue TF via OGT, change to 22M PEPuP Protocol with Vital High Protein at goal rate of 25 ml/h (600 ml per day), with Prostat 30 ml QID to provide 1000 kcals, 113 gm protein, 502 ml free water daily.  Above TF regimen plus Propofol will provide a total of 1224 kcals (63% of estimated needs) and 113 gm protein (99% of estimated needs).  Nutrition Dx:   Inadequate oral intake related to inability to eat as evidenced by NPO status. Ongoing.  Goal:   Enteral nutrition to provide 60-70% of estimated calorie needs (22-25 kcals/kg ideal body weight) and 100% of estimated protein needs, based on ASPEN guidelines for permissive underfeeding in critically ill obese individuals.  Monitor:   TF tolerance/adequacy, weight trend, labs, vent status.  Assessment:   Pt admitted s/p emergency adenoidectomy 6 days ago with worsening pain and inability to eat or drink. Pt found to have retropharyngeal abscess with extension into the deep neck. S/P I&D on 1/31.  Patient is currently intubated on ventilator support.  MV: 7.5 L/min Temp (24hrs), Avg:99.2 F (37.3 C), Min:98.1 F (36.7 C), Max:100.6 F (38.1 C)  Propofol: 8.5 ml/hr providing 224 kcals/day.  Height: Ht Readings from Last 1 Encounters:  06/09/13 5' (1.524 m)    Weight Status:   Wt Readings from Last 1 Encounters:  06/09/13 209 lb (94.802 kg)    Re-estimated needs:  Kcal: 1950  Protein: 114 gm Fluid: 1.9-2 L  Skin: no issues  Diet Order: NPO   Intake/Output Summary (Last 24 hours) at 06/12/13 1038 Last data filed at 06/12/13 1000  Gross per 24 hour  Intake 3944.3 ml  Output   3240 ml  Net  704.3 ml    Last BM: None documented since admission   Labs:   Recent Labs Lab 06/10/13 1900 06/11/13 0500 06/12/13 0420  NA 142 143 142  K 3.7 3.9 3.8  CL 106 109 110  CO2 22 21 22   BUN 13 9 13   CREATININE 0.47* 0.85 0.77  CALCIUM 6.9* 7.3* 6.8*  MG  --  2.2  --   PHOS  --   3.9  --   GLUCOSE 102* 94 93    CBG (last 3)   Recent Labs  06/11/13 2341 06/12/13 0343 06/12/13 0733  GLUCAP 105* 109* 127*    Scheduled Meds: . antiseptic oral rinse  15 mL Mouth Rinse QID  . cefTRIAXone (ROCEPHIN)  IV  1 g Intravenous QHS  . chlorhexidine  15 mL Mouth Rinse BID  . feeding supplement (JEVITY 1.2 CAL)  1,000 mL Per Tube Q24H  . feeding supplement (PRO-STAT SUGAR FREE 64)  30 mL Per Tube TID WC  . heparin subcutaneous  5,000 Units Subcutaneous Q8H  . insulin aspart  0-15 Units Subcutaneous Q4H  . metronidazole  500 mg Intravenous Q8H  . pantoprazole (PROTONIX) IV  40 mg Intravenous Daily  . sodium chloride  10-40 mL Intracatheter Q12H    Continuous Infusions: . sodium chloride 100 mL/hr at 06/12/13 0201  . DOPamine    . fentaNYL infusion INTRAVENOUS 200 mcg/hr (06/12/13 0308)  . propofol 15 mcg/kg/min (06/12/13 0700)    Joaquin CourtsKimberly Kajsa Butrum, RD, LDN, CNSC Pager [phone] After Hours Pager [phone]

## 2013-06-12 NOTE — Progress Notes (Signed)
RT Note: Pt transported to CT with no complications, RT to monitor. 

## 2013-06-12 NOTE — Consult Note (Signed)
Name: Kimberly May MRN: [id number] DOB: [birth year]    ADMISSION DATE:  06/09/2013 CONSULTATION DATE:  06/10/13  REFERRING MD :  Flo Shanks PRIMARY SERVICE: ENT  CHIEF COMPLAINT:  Post I&D of retropharyngeal abscess. Intubated.  BRIEF PATIENT DESCRIPTION:  23 years old female with no significant PMH. Underwent I&D of retropharyngeal abscess and possible early mediastinitis. Ot of the OR intubated. PCCM consult called for mechanical ventilation management.   SIGNIFICANT EVENTS / STUDIES:  06/09/13 -> I&D retropharyngeal abscess 2/2- weaned, no leak  LINES / TUBES: - Peripheral IV's 1-31 OTT>> CULTURES: - FEW GROUP A STREP (S.PYOGENES) ISOLATED FROM PRIOR I&D ON 1/25 1/31 ana>> 1/31 wound>>  ANTIBIOTICS: 1/30  Rocephin>> -1/30 Flagyl>> 1/31 cleocin>>  SUBJECTIVE:  More awake, weaning  VITAL SIGNS: Temp:  [99 F (37.2 C)-100.6 F (38.1 C)] 99 F (37.2 C) (02/02 1241) Pulse Rate:  [71-112] 96 (02/02 1300) Resp:  [9-23] 16 (02/02 1300) BP: (101-123)/(45-61) 113/61 mmHg (02/02 1300) SpO2:  [97 %-100 %] 99 % (02/02 1300) FiO2 (%):  [40 %-50 %] 40 % (02/02 1209) HEMODYNAMICS: CVP:  [14 mmHg-24 mmHg] 24 mmHg VENTILATOR SETTINGS: Vent Mode:  [-] CPAP;PSV FiO2 (%):  [40 %-50 %] 40 % Set Rate:  [16 bmp] 16 bmp PEEP:  [5 cmH20-10 cmH20] 5 cmH20 Pressure Support:  [5 cmH20] 5 cmH20 Plateau Pressure:  [20 cmH20-27 cmH20] 20 cmH20 INTAKE / OUTPUT: Intake/Output     02/01 0701 - 02/02 0700 02/02 0701 - 02/03 0700   I.V. (mL/kg) 2768.8 (29.2) 751 (7.9)   NG/GT 400 120   IV Piggyback 450    Total Intake(mL/kg) 3618.8 (38.2) 871 (9.2)   Urine (mL/kg/hr) 3260 (1.4) 270 (0.4)   Total Output 3260 270   Net +358.8 +601          PHYSICAL EXAMINATION: General: awake, no distress Neuro: nonfocal HEENT: wound dressed PULM: CTA CV: s1 s2 RRR GI: soft, bs wnl , no r Extremities: mild edema   LABS:  CBC  Recent Labs Lab 06/11/13 0500 06/11/13 0843 06/12/13 0420   WBC 15.0* 17.6* 9.8  HGB 8.3* 9.2* 7.0*  HCT 25.2* 28.2* 21.2*  PLT 219 247 188   Coag's  Recent Labs Lab 06/10/13 1900 06/11/13 0500  APTT 32  --   INR  --  1.55*   BMET  Recent Labs Lab 06/10/13 1900 06/11/13 0500 06/12/13 0420  NA 142 143 142  K 3.7 3.9 3.8  CL 106 109 110  CO2 22 21 22   BUN 13 9 13   CREATININE 0.47* 0.85 0.77  GLUCOSE 102* 94 93   Electrolytes  Recent Labs Lab 06/10/13 1900 06/11/13 0500 06/12/13 0420  CALCIUM 6.9* 7.3* 6.8*  MG  --  2.2  --   PHOS  --  3.9  --    Sepsis Markers  Recent Labs Lab 06/10/13 1900 06/11/13 0923 06/12/13 0420  LATICACIDVEN 0.5  --   --   PROCALCITON  --  0.18 0.10   ABG  Recent Labs Lab 06/10/13 1607 06/10/13 2144 06/11/13 0441  PHART 7.556* 7.412 7.292*  PCO2ART 25.3* 33.3* 46.0*  PO2ART 113.0* 133.0* 109.0*   Liver Enzymes  Recent Labs Lab 06/09/13 1945 06/10/13 1900  AST 9 7  ALT 15 10  ALKPHOS 61 56  BILITOT 0.4 0.6  ALBUMIN 2.5* 2.0*   Cardiac Enzymes  Recent Labs Lab 06/10/13 1900  TROPONINI <0.30   Glucose  Recent Labs Lab 06/11/13 1455 06/11/13 1945 06/11/13 2341 06/12/13 0343 06/12/13  [id number] 06/12/13 1159  GLUCAP 84 102* 105* 109* 127* 101*    Imaging Ct Chest Wo Contrast  06/10/2013   CLINICAL DATA:  Pulmonary infiltrates  EXAM: CT CHEST WITHOUT CONTRAST  TECHNIQUE: Multidetector CT imaging of the chest was performed following the standard protocol without IV contrast.  COMPARISON:  None.  FINDINGS: The central airways are patent. There are bilateral small pleural effusions. There is bibasilar airspace disease which may reflect compressive atelectasis versus developing pneumonia. There is no pneumothorax. There is an endotracheal to with the tip 1 cm above the carina.  There are no pathologically enlarged axillary, hilar or mediastinal lymph nodes. There is a surgical drain in the left paratracheal region extending into the upper mediastinum.  The heart size is  normal. There is a small pericardial effusion. The thoracic aorta is normal in caliber. There is a right-sided PICC line with the tip projecting over the proximal SVC.  Review of bone windows demonstrates no focal lytic or sclerotic lesions.  Limited non-contrast images of the upper abdomen were obtained. There is a nasogastric tube with the tip in the antrum of the stomach. There is nonspecific splenomegaly.  IMPRESSION: 1. Bilateral small pleural effusions with associated airspace disease which may represent compressive atelectasis versus developing pneumonia. 2. Endotracheal tube with the tip 1 cm above the carina. Recommend retracting the endotracheal tube 2 cm. 3. Surgical drain in the left paratracheal region extending into the upper mediastinum. 4. Small pericardial effusion. 5. Splenomegaly.   Electronically Signed   By: Elige KoHetal  Patel   On: 06/10/2013 18:28   Dg Chest Port 1 View  06/12/2013   CLINICAL DATA:  Endotracheal tube.  EXAM: PORTABLE CHEST - 1 VIEW  COMPARISON:  06/11/2013  FINDINGS: Endotracheal tube ends in the mid thoracic trachea. Orogastric tube crosses the diaphragm. Unchanged positioning of right upper extremity PICC. Surgical packing at the left neck base persists.  Unchanged cardiopericardial enlargement. Haziness of the lower chest persists, consistent with pleural fluid, atelectasis, and possibly pneumonia based on previous CT.  IMPRESSION: 1. Good positioning of tubes and lines. 2. Unchanged small effusions and basilar atelectasis. There could be superimposed pneumonia.   Electronically Signed   By: Tiburcio PeaJonathan  Watts M.D.   On: 06/12/2013 06:29   Dg Chest Port 1 View  06/11/2013   CLINICAL DATA:  Endotracheal tube placement.  EXAM: PORTABLE CHEST - 1 VIEW  COMPARISON:  06/10/2013  FINDINGS: Endotracheal tube tip projects 1.8 cm above the carina. This is stable.  New right PICC has its tip in the lower superior vena cava. Nasogastric tube is stable passing below the diaphragm into the  stomach.  Hazy opacity in the lower lungs appears improved although this is most likely a technical change only. This is likely combination of effusions and atelectasis. No convincing pulmonary edema.  Cardiac silhouette is mildly enlarged.  No pneumothorax.  IMPRESSION: 1. Support apparatus is well positioned. New right PICC has its tip in the lower superior vena cava. 2. Lung base opacity appears improved although this change could be technical only. 3. No other change.  No new lung opacities.   Electronically Signed   By: Amie Portlandavid  Ormond M.D.   On: 06/11/2013 08:19   ASSESSMENT / PLAN:  PULMONARY A: 1) Intubated post I&D of retropharyngeal abscess,ALI risk, effusion P:   -weaning this am cpap 5 as able to bring peep to 8 to 5 -cpap 5 ps 5 goal 30 min , appears good -rsbi appears good -leak neg with  high pre test prob for edema, would NOT extubate today -may need steroids, some concern with infection -consider lasix  CARDIOVASCULAR A:  Normotension, sepsis P:  - kvo -may need lasix  RENAL A:   1) K is 5, resolved, edema P:   - Will follow chemistry. - consider lasix  GASTROINTESTINAL A:   1) TF P:   - GI prophylaxis with protonix. - continue TF, may benefit oxepa  HEMATOLOGIC A:   1) anemia, likely dilutional P:  - Will follow CBC in pm after lasix No Tx needed at this stage  INFECTIOUS A:   1) Retropharyngeal abscess - group A strep 2) Post I&D P:   ID following Ct done  ENDOCRINE A:   1) No issues   NEUROLOGIC A:   1) Intubated, sedated P:   - Sedate for vent tolerance, WUA  Global: no leak, no extubation, weaning, ABX, lasix, cbc in pm   Ccm time 30 min  Mcarthur Rossetti. Tyson Alias, MD, FACP Pgr: [phone] [facility]'

## 2013-06-12 NOTE — Progress Notes (Signed)
06/12/2013 12:33 PM  [place], [place] [id number]  Post-Op Day 3    Temp:  [99 F (37.2 C)-100.6 F (38.1 C)] 100.6 F (38.1 C) (02/02 0829) Pulse Rate:  [71-112] 93 (02/02 1132) Resp:  [9-22] 16 (02/02 1132) BP: (101-123)/(42-60) 113/51 mmHg (02/02 1132) SpO2:  [97 %-100 %] 99 % (02/02 1132) FiO2 (%):  [40 %-50 %] 40 % (02/02 1209),     Intake/Output Summary (Last 24 hours) at 06/12/13 1233 Last data filed at 06/12/13 1100  Gross per 24 hour  Intake 3872.8 ml  Output   3115 ml  Net  757.8 ml    Results for orders placed during the hospital encounter of 06/09/13 (from the past 24 hour(s))  GLUCOSE, CAPILLARY     Status: None   Collection Time    06/11/13  2:55 PM      Result Value Range   Glucose-Capillary 84  70 - 99 mg/dL  GLUCOSE, CAPILLARY     Status: Abnormal   Collection Time    06/11/13  7:45 PM      Result Value Range   Glucose-Capillary 102 (*) 70 - 99 mg/dL   Comment 1 Notify RN    GLUCOSE, CAPILLARY     Status: Abnormal   Collection Time    06/11/13 11:41 PM      Result Value Range   Glucose-Capillary 105 (*) 70 - 99 mg/dL  GLUCOSE, CAPILLARY     Status: Abnormal   Collection Time    06/12/13  3:43 AM      Result Value Range   Glucose-Capillary 109 (*) 70 - 99 mg/dL   Comment 1 Notify RN    CBC     Status: Abnormal   Collection Time    06/12/13  4:20 AM      Result Value Range   WBC 9.8  4.0 - 10.5 K/uL   RBC 2.49 (*) 3.87 - 5.11 MIL/uL   Hemoglobin 7.0 (*) 12.0 - 15.0 g/dL   HCT 40.9 (*) 81.1 - 91.4 %   MCV 85.1  78.0 - 100.0 fL   MCH 28.1  26.0 - 34.0 pg   MCHC 33.0  30.0 - 36.0 g/dL   RDW 78.2  95.6 - 21.3 %   Platelets 188  150 - 400 K/uL  BASIC METABOLIC PANEL     Status: Abnormal   Collection Time    06/12/13  4:20 AM      Result Value Range   Sodium 142  137 - 147 mEq/L   Potassium 3.8  3.7 - 5.3 mEq/L   Chloride 110  96 - 112 mEq/L   CO2 22  19 - 32 mEq/L   Glucose, Bld 93  70 - 99 mg/dL   BUN 13  6 - 23 mg/dL   Creatinine, Ser  0.86  0.50 - 1.10 mg/dL   Calcium 6.8 (*) 8.4 - 10.5 mg/dL   GFR calc non Af Amer >90  >90 mL/min   GFR calc Af Amer >90  >90 mL/min  PROCALCITONIN     Status: None   Collection Time    06/12/13  4:20 AM      Result Value Range   Procalcitonin 0.10    GLUCOSE, CAPILLARY     Status: Abnormal   Collection Time    06/12/13  7:33 AM      Result Value Range   Glucose-Capillary 127 (*) 70 - 99 mg/dL  GLUCOSE, CAPILLARY     Status: Abnormal  Collection Time    06/12/13 11:59 AM      Result Value Range   Glucose-Capillary 101 (*) 70 - 99 mg/dL    SUBJECTIVE:  Feels some better.  Vent wean parameters good, except no leak on cuff test.  OBJECTIVE:  Much less drainage LEFT neck.  Some tenderness bilat necks, no real worsening.    IMPRESSION:  Stable.  Hct becoming low vs fluid loading.  PLAN:   CT neck.  May be able to extubate soon.  Will start to advance drains.  Flo ShanksWOLICKI, Mayco [place]

## 2013-06-12 NOTE — Progress Notes (Signed)
UR Completed.  [facility] [phone] 06/12/2013  

## 2013-06-12 NOTE — Progress Notes (Signed)
Patient ID: Kimberly May, female   DOB: 17-Sep-1990, 23 y.o.   MRN: [id number]         [facility] for Infectious Disease    Date of Admission:  06/09/2013   Total days of antibiotics 19        Day 3 ceftriaxone        Day 3 metronidazole         Active Problems:   Retropharyngeal abscess   Acute respiratory failure   Septic shock   ARF (acute renal failure)   . antiseptic oral rinse  15 mL Mouth Rinse QID  . cefTRIAXone (ROCEPHIN)  IV  1 g Intravenous QHS  . chlorhexidine  15 mL Mouth Rinse BID  . feeding supplement (PRO-STAT SUGAR FREE 64)  30 mL Per Tube QID  . feeding supplement (VITAL HIGH PROTEIN)  1,000 mL Per Tube Q24H  . heparin subcutaneous  5,000 Units Subcutaneous Q8H  . insulin aspart  0-15 Units Subcutaneous Q4H  . lidocaine  15 mL Mouth/Throat Once  . metronidazole  500 mg Intravenous Q8H  . pantoprazole (PROTONIX) IV  40 mg Intravenous Daily  . sodium chloride  10-40 mL Intracatheter Q12H     Past Medical History  Diagnosis Date  . Tuberculosis 15 YRS AGO     FATHER HAD TB , PT WAS MED [place]    History  Substance Use Topics  . Smoking status: Never Smoker   . Smokeless tobacco: Never Used  . Alcohol Use: No    Family History  Problem Relation Age of Onset  . Cancer Father     Allergies  Allergen Reactions  . Penicillins Rash    Objective: Temp:  [99 F (37.2 C)-100.6 F (38.1 C)] 99 F (37.2 C) (02/02 1241) Pulse Rate:  [71-112] 96 (02/02 1300) Resp:  [9-23] 16 (02/02 1300) BP: (101-123)/(45-61) 113/61 mmHg (02/02 1300) SpO2:  [97 %-100 %] 99 % (02/02 1300) FiO2 (%):  [40 %-50 %] 40 % (02/02 1209)  General: She remains intubated Neck: Swelling on left with surgical drains in place Lungs: Clear Cor: Regular S1 and S2 with no murmur   Lab Results Lab Results  Component Value Date   WBC 9.8 06/12/2013   HGB 7.0* 06/12/2013   HCT 21.2* 06/12/2013   MCV 85.1 06/12/2013   PLT 188 06/12/2013    Lab Results  Component Value Date   CREATININE 0.77 06/12/2013   BUN 13 06/12/2013   NA 142 06/12/2013   K 3.8 06/12/2013   CL 110 06/12/2013   CO2 22 06/12/2013    Lab Results  Component Value Date   ALT 10 06/10/2013   AST 7 06/10/2013   ALKPHOS 56 06/10/2013   BILITOT 0.6 06/10/2013      Microbiology: Recent Results (from the past 240 hour(s))  CULTURE, ROUTINE-ABSCESS     Status: None   Collection Time    06/04/13  1:02 PM      Result Value Range Status   Specimen Description ABSCESS   Final   Special Requests     Final   Value: PATIENT ON FOLLOWING CLINDAMYCIN RETROPHARYNGEAL ABSCESS   Gram Stain     Final   Value: MODERATE WBC PRESENT, PREDOMINANTLY PMN     RARE SQUAMOUS EPITHELIAL CELLS PRESENT     ABUNDANT GRAM POSITIVE COCCI IN PAIRS     IN CLUSTERS MODERATE GRAM NEGATIVE RODS     FEW GRAM NEGATIVE COCCI   Culture     Final  Value: FEW GROUP A STREP (S.PYOGENES) ISOLATED     Performed at [facility]   Report Status 06/07/2013 FINAL   Final  ANAEROBIC CULTURE     Status: None   Collection Time    06/04/13  1:02 PM      Result Value Range Status   Specimen Description ABSCESS   Final   Special Requests     Final   Value: PATIENT ON FOLLOWING CLINDAMYCIN RETRO PHARYNGEAL ABSCESS   Gram Stain     Final   Value: FEW WBC PRESENT, PREDOMINANTLY PMN     FEW SQUAMOUS EPITHELIAL CELLS PRESENT     FEW GRAM POSITIVE COCCI IN PAIRS     IN CLUSTERS     Performed at [facility]   Culture     Final   Value: PREVOTELLA MELANINOGENICA     Note: BETA LACTAMASE NEGATIVE     Performed at [facility]   Report Status 06/11/2013 FINAL   Final  CULTURE, ROUTINE-ABSCESS     Status: None   Collection Time    06/10/13 12:41 AM      Result Value Range Status   Specimen Description NECK LEFT ABSCESS   Final   Special Requests     Final   Value: LEFT RETROPHARYNGEAL PATIENT ON FOLLOWING ZITHROMAX   Gram Stain     Final   Value: ABUNDANT WBC PRESENT, PREDOMINANTLY PMN     FEW SQUAMOUS EPITHELIAL CELLS  PRESENT     MODERATE GRAM POSITIVE COCCI IN PAIRS     IN CLUSTERS IN CHAINS     Performed at [facility]   Culture     Final   Value: MODERATE GROUP A STREP (S.PYOGENES) ISOLATED     Performed at [facility]   Report Status 06/12/2013 FINAL   Final  ANAEROBIC CULTURE     Status: None   Collection Time    06/10/13 12:41 AM      Result Value Range Status   Specimen Description ABSCESS LEFT NECK   Final   Special Requests     Final   Value: LEFT RETROPHARYNGEAL PATIENT ON FOLLOWING ZITHROMAX   Gram Stain     Final   Value: MODERATE WBC PRESENT, PREDOMINANTLY PMN     RARE SQUAMOUS EPITHELIAL CELLS PRESENT     MODERATE GRAM POSITIVE COCCI IN PAIRS     IN CLUSTERS     Performed at [facility]   Culture     Final   Value: NO ANAEROBES ISOLATED; CULTURE IN PROGRESS FOR 5 DAYS     Performed at [facility]   Report Status PENDING   Incomplete  MRSA PCR SCREENING     Status: None   Collection Time    06/10/13  1:46 AM      Result Value Range Status   MRSA by PCR NEGATIVE  NEGATIVE Final   Comment:            The GeneXpert MRSA Assay (FDA     approved for NASAL specimens     only), is one component of a     comprehensive MRSA colonization     surveillance program. It is not     intended to diagnose MRSA     infection nor to guide or     monitor treatment for     MRSA infections.  URINE CULTURE     Status: None   Collection Time    06/10/13  7:05 PM      Result Value Range Status   Specimen Description URINE, CATHETERIZED   Final   Special Requests NONE   Final   Culture  Setup Time     Final   Value: 06/11/2013 00:26     [facility]   Colony Count     Final   Value: NO GROWTH     [facility]   Culture     Final   Value: NO GROWTH     [facility]formed at [facility]   Report Status 06/12/2013 FINAL   Final  CULTURE, RESPIRATORY (NON-EXPECTORATED)     Status: None   Collection Time     06/10/13  7:30 PM      Result Value Range Status   Specimen Description ENDOTRACHEAL ASPIRATE   Final   Special Requests NONE   Final   Gram Stain     Final   Value: MODERATE WBC PRESENT, PREDOMINANTLY PMN     RARE SQUAMOUS EPITHELIAL CELLS PRESENT     NO ORGANISMS SEEN     [facility]formed at [facility]   Culture     Final   Value: Culture reincubated for better growth     [facility]formed at [facility]   Report Status PENDING   Incomplete  CULTURE, BLOOD (ROUTINE X 2)     Status: None   Collection Time    06/10/13  8:00 PM      Result Value Range Status   Specimen Description BLOOD LEFT ANTECUBITAL   Final   Special Requests BOTTLES DRAWN AEROBIC AND ANAEROBIC 10CC   Final   Culture  Setup Time     Final   Value: 06/11/2013 00:17     [facility]formed [facility]   Culture     Final   Value:        BLOOD CULTURE RECEIVED NO GROWTH TO DATE CULTURE WILL BE HELD FOR 5 DAYS BEFORE ISSUING A FINAL NEGATIVE REPORT     [facility]formed at [facility]   Report Status PENDING   Incomplete  CULTURE, BLOOD (ROUTINE X 2)     Status: None   Collection Time    06/10/13  8:09 PM      Result Value Range Status   Specimen Description BLOOD LEFT HAND   Final   Special Requests BOTTLES DRAWN AEROBIC AND ANAEROBIC 10CC   Final   Culture  Setup Time     Final   Value: 06/11/2013 00:17     [facility]formed at [facility]   Culture     Final   Value:        BLOOD CULTURE RECEIVED NO GROWTH TO DATE CULTURE WILL BE HELD FOR 5 DAYS BEFORE ISSUING A FINAL NEGATIVE REPORT     [facility]formed at [facility]   Report Status PENDING   Incomplete    Studies/Results: Ct Chest Wo Contrast  06/10/2013   CLINICAL DATA:  Pulmonary infiltrates  EXAM: CT CHEST WITHOUT CONTRAST  TECHNIQUE: Multidetector CT imaging of the chest was performed following the standard protocol without IV contrast.  COMPARISON:  None.  FINDINGS: The central airways are patent. There are bilateral small pleural  effusions. There is bibasilar airspace disease which may reflect compressive atelectasis versus developing pneumonia. There is no pneumothorax. There is an endotracheal to with the tip 1 cm above the carina.  There are no pathologically enlarged axillary, hilar or mediastinal lymph nodes.  There is a surgical drain in the left paratracheal region extending into the upper mediastinum.  The heart size is normal. There is a small pericardial effusion. The thoracic aorta is normal in caliber. There is a right-sided PICC line with the tip projecting over the proximal SVC.  Review of bone windows demonstrates no focal lytic or sclerotic lesions.  Limited non-contrast images of the upper abdomen were obtained. There is a nasogastric tube with the tip in the antrum of the stomach. There is nonspecific splenomegaly.  IMPRESSION: 1. Bilateral small pleural effusions with associated airspace disease which may represent compressive atelectasis versus developing pneumonia. 2. Endotracheal tube with the tip 1 cm above the carina. Recommend retracting the endotracheal tube 2 cm. 3. Surgical drain in the left paratracheal region extending into the upper mediastinum. 4. Small pericardial effusion. 5. Splenomegaly.   Electronically Signed   By: Elige Ko   On: 06/10/2013 18:28   Dg Chest Port 1 View  06/12/2013   CLINICAL DATA:  Endotracheal tube.  EXAM: PORTABLE CHEST - 1 VIEW  COMPARISON:  06/11/2013  FINDINGS: Endotracheal tube ends in the mid thoracic trachea. Orogastric tube crosses the diaphragm. Unchanged positioning of right upper extremity PICC. Surgical packing at the left neck base persists.  Unchanged cardiopericardial enlargement. Haziness of the lower chest persists, consistent with pleural fluid, atelectasis, and possibly pneumonia based on previous CT.  IMPRESSION: 1. Good positioning of tubes and lines. 2. Unchanged small effusions and basilar atelectasis. There could be superimposed pneumonia.   Electronically  Signed   By: Tiburcio Pea M.D.   On: 06/12/2013 06:29   Dg Chest Port 1 View  06/11/2013   CLINICAL DATA:  Endotracheal tube placement.  EXAM: PORTABLE CHEST - 1 VIEW  COMPARISON:  06/10/2013  FINDINGS: Endotracheal tube tip projects 1.8 cm above the carina. This is stable.  New right PICC has its tip in the lower superior vena cava. Nasogastric tube is stable passing below the diaphragm into the stomach.  Hazy opacity in the lower lungs appears improved although this is most likely a technical change only. This is likely combination of effusions and atelectasis. No convincing pulmonary edema.  Cardiac silhouette is mildly enlarged.  No pneumothorax.  IMPRESSION: 1. Support apparatus is well positioned. New right PICC has its tip in the lower superior vena cava. 2. Lung base opacity appears improved although this change could be technical only. 3. No other change.  No new lung opacities.   Electronically Signed   By: Amie Portland M.D.   On: 06/11/2013 08:19    Assessment: Operative cultures have grown group A strep (again). I suspect that she was failing oral antibiotic therapy because of abscess formation. I will stop metronidazole and continue ceftriaxone.  Plan: 1. Continue ceftriaxone 2. Discontinue metronidazole  Cliffton Asters, MD [facility] for Infectious Disease [facility] [phone] pager   [phone] cell 06/12/2013, 2:05 PM

## 2013-06-13 ENCOUNTER — Inpatient Hospital Stay (HOSPITAL_COMMUNITY): Payer: BC Managed Care – PPO

## 2013-06-13 LAB — BASIC METABOLIC PANEL
BUN: 16 mg/dL (ref 6–23)
CO2: 27 mEq/L (ref 19–32)
CREATININE: 0.69 mg/dL (ref 0.50–1.10)
Calcium: 7.8 mg/dL — ABNORMAL LOW (ref 8.4–10.5)
Chloride: 106 mEq/L (ref 96–112)
GFR calc Af Amer: >90 mL/min (ref >90)
GLUCOSE: 112 mg/dL — AB (ref 70–99)
Potassium: 3.9 mEq/L (ref 3.7–5.3)
Sodium: 143 mEq/L (ref 137–147)

## 2013-06-13 LAB — CBC WITH DIFFERENTIAL/PLATELET
BASOS ABS: 0 10*3/uL (ref 0.0–0.1)
BASOS PCT: 0 % (ref 0–1)
EOS ABS: 0.2 10*3/uL (ref 0.0–0.7)
Eosinophils Relative: 2 % (ref 0–5)
HCT: 25.7 % — ABNORMAL LOW (ref 36.0–46.0)
Hemoglobin: 8.2 g/dL — ABNORMAL LOW (ref 12.0–15.0)
Lymphocytes Relative: 17 % (ref 12–46)
Lymphs Abs: 1.6 10*3/uL (ref 0.7–4.0)
MCH: 27.5 pg (ref 26.0–34.0)
MCHC: 31.9 g/dL (ref 30.0–36.0)
MCV: 86.2 fL (ref 78.0–100.0)
MONOS PCT: 8 % (ref 3–12)
Monocytes Absolute: 0.8 10*3/uL (ref 0.1–1.0)
NEUTROS ABS: 7.3 10*3/uL (ref 1.7–7.7)
NEUTROS PCT: 73 % (ref 43–77)
Platelets: 244 10*3/uL (ref 150–400)
RBC: 2.98 MIL/uL — ABNORMAL LOW (ref 3.87–5.11)
RDW: 13.7 % (ref 11.5–15.5)
WBC: 10 10*3/uL (ref 4.0–10.5)

## 2013-06-13 LAB — GLUCOSE, CAPILLARY
GLUCOSE-CAPILLARY: 101 mg/dL — AB (ref 70–99)
GLUCOSE-CAPILLARY: 106 mg/dL — AB (ref 70–99)
GLUCOSE-CAPILLARY: 109 mg/dL — AB (ref 70–99)
GLUCOSE-CAPILLARY: 113 mg/dL — AB (ref 70–99)
GLUCOSE-CAPILLARY: 116 mg/dL — AB (ref 70–99)
Glucose-Capillary: 110 mg/dL — ABNORMAL HIGH (ref 70–99)

## 2013-06-13 LAB — CULTURE, RESPIRATORY W GRAM STAIN

## 2013-06-13 LAB — PHOSPHORUS: Phosphorus: 4 mg/dL (ref 2.3–4.6)

## 2013-06-13 LAB — CULTURE, RESPIRATORY

## 2013-06-13 LAB — PROCALCITONIN: Procalcitonin: 0.16 ng/mL

## 2013-06-13 LAB — MAGNESIUM: Magnesium: 2.2 mg/dL (ref 1.5–2.5)

## 2013-06-13 LAB — PROTIME-INR
INR: 1.27 (ref 0.00–1.49)
Prothrombin Time: 15.6 seconds — ABNORMAL HIGH (ref 11.6–15.2)

## 2013-06-13 LAB — TRIGLYCERIDES: Triglycerides: 140 mg/dL (ref <150)

## 2013-06-13 LAB — APTT: aPTT: 29 seconds (ref 24–37)

## 2013-06-13 MED ORDER — DEXTROSE 5 % IV SOLN
2.0000 g | INTRAVENOUS | Status: AC
  Administered 2013-06-13 – 2013-06-25 (×13): 2 g via INTRAVENOUS
  Filled 2013-06-13 (×14): qty 2

## 2013-06-13 NOTE — Progress Notes (Signed)
06/13/2013 9:17 AM  [place], [place] [phone]  Post-Op Day 3    Temp:  [98.3 F (36.8 C)-99.5 F (37.5 C)] 98.3 F (36.8 C) (02/03 0803) Pulse Rate:  [63-103] 67 (02/03 0732) Resp:  [13-23] 17 (02/03 0732) BP: (105-134)/(45-92) 110/56 mmHg (02/03 0732) SpO2:  [96 %-100 %] 99 % (02/03 0732) FiO2 (%):  [40 %] 40 % (02/03 0800),     Intake/Output Summary (Last 24 hours) at 06/13/13 0917 Last data filed at 06/13/13 0800  Gross per 24 hour  Intake 1930.8 ml  Output   3750 ml  Net -1819.2 ml    Results for orders placed during the hospital encounter of 06/09/13 (from the past 24 hour(s))  GLUCOSE, CAPILLARY     Status: Abnormal   Collection Time    06/12/13 11:59 AM      Result Value Range   Glucose-Capillary 101 (*) 70 - 99 mg/dL  GLUCOSE, CAPILLARY     Status: Abnormal   Collection Time    06/12/13  3:27 PM      Result Value Range   Glucose-Capillary 102 (*) 70 - 99 mg/dL  GLUCOSE, CAPILLARY     Status: Abnormal   Collection Time    06/12/13  7:01 PM      Result Value Range   Glucose-Capillary 105 (*) 70 - 99 mg/dL  CBC     Status: Abnormal   Collection Time    06/12/13 10:10 PM      Result Value Range   WBC 10.6 (*) 4.0 - 10.5 K/uL   RBC 2.91 (*) 3.87 - 5.11 MIL/uL   Hemoglobin 7.9 (*) 12.0 - 15.0 g/dL   HCT 95.6 (*) 21.3 - 08.6 %   MCV 85.9  78.0 - 100.0 fL   MCH 27.1  26.0 - 34.0 pg   MCHC 31.6  30.0 - 36.0 g/dL   RDW 57.8  46.9 - 62.9 %   Platelets 228  150 - 400 K/uL  GLUCOSE, CAPILLARY     Status: Abnormal   Collection Time    06/12/13 11:56 PM      Result Value Range   Glucose-Capillary 113 (*) 70 - 99 mg/dL   Comment 1 Documented in Chart     Comment 2 Notify RN    BASIC METABOLIC PANEL     Status: Abnormal   Collection Time    06/13/13  4:00 AM      Result Value Range   Sodium 143  137 - 147 mEq/L   Potassium 3.9  3.7 - 5.3 mEq/L   Chloride 106  96 - 112 mEq/L   CO2 27  19 - 32 mEq/L   Glucose, Bld 112 (*) 70 - 99 mg/dL   BUN 16  6 - 23  mg/dL   Creatinine, Ser 5.28  0.50 - 1.10 mg/dL   Calcium 7.8 (*) 8.4 - 10.5 mg/dL   GFR calc non Af Amer >90  >90 mL/min   GFR calc Af Amer >90  >90 mL/min  CBC WITH DIFFERENTIAL     Status: Abnormal   Collection Time    06/13/13  4:00 AM      Result Value Range   WBC 10.0  4.0 - 10.5 K/uL   RBC 2.98 (*) 3.87 - 5.11 MIL/uL   Hemoglobin 8.2 (*) 12.0 - 15.0 g/dL   HCT 41.3 (*) 24.4 - 01.0 %   MCV 86.2  78.0 - 100.0 fL   MCH 27.5  26.0 - 34.0 pg  MCHC 31.9  30.0 - 36.0 g/dL   RDW 16.113.7  09.611.5 - 04.515.5 %   Platelets 244  150 - 400 K/uL   Neutrophils Relative % 73  43 - 77 %   Neutro Abs 7.3  1.7 - 7.7 K/uL   Lymphocytes Relative 17  12 - 46 %   Lymphs Abs 1.6  0.7 - 4.0 K/uL   Monocytes Relative 8  3 - 12 %   Monocytes Absolute 0.8  0.1 - 1.0 K/uL   Eosinophils Relative 2  0 - 5 %   Eosinophils Absolute 0.2  0.0 - 0.7 K/uL   Basophils Relative 0  0 - 1 %   Basophils Absolute 0.0  0.0 - 0.1 K/uL  PHOSPHORUS     Status: None   Collection Time    06/13/13  4:00 AM      Result Value Range   Phosphorus 4.0  2.3 - 4.6 mg/dL  MAGNESIUM     Status: None   Collection Time    06/13/13  4:00 AM      Result Value Range   Magnesium 2.2  1.5 - 2.5 mg/dL  PROCALCITONIN     Status: None   Collection Time    06/13/13  4:00 AM      Result Value Range   Procalcitonin 0.16    TRIGLYCERIDES     Status: None   Collection Time    06/13/13  4:00 AM      Result Value Range   Triglycerides 140  <150 mg/dL  GLUCOSE, CAPILLARY     Status: Abnormal   Collection Time    06/13/13  4:20 AM      Result Value Range   Glucose-Capillary 101 (*) 70 - 99 mg/dL   Comment 1 Documented in Chart     Comment 2 Notify RN    GLUCOSE, CAPILLARY     Status: Abnormal   Collection Time    06/13/13  8:02 AM      Result Value Range   Glucose-Capillary 110 (*) 70 - 99 mg/dL    SUBJECTIVE:  No worse as far as pain.  Difficult to communicate with.  OBJECTIVE:  Mild/mod serous drainage.  CT neck with extensive  retropharyngeal swelling still.  Upper drain somewhat lateral and low. No obvious loculations.  Flex laryngoscopy could not adequately assess larynx.  Too many tubes and secretions  IMPRESSION:  Difficult to gauge progress.  Still low grade fever.  WBC 10K.  Still unsafe airway with extensive swelling.  PLAN:  Cont IV abx.  I am considering repeat LEFT neck exploration, direct laryngscopy, tracheostomy.  Will try to do this today or tomorrow.    Flo ShanksWOLICKI, Kyndell Zeiser

## 2013-06-13 NOTE — Progress Notes (Signed)
Patient ID: Kimberly May, female   DOB: 1991/02/17, 23 y.o.   MRN: [id number]         [facility]    Date of Admission:  06/09/2013   Total days of antibiotics 20        Day 4 ceftriaxone         Active Problems:   Retropharyngeal abscess   Acute respiratory failure   Septic shock   ARF (acute renal failure)   . antiseptic oral rinse  15 mL Mouth Rinse QID  . cefTRIAXone (ROCEPHIN)  IV  1 g Intravenous QHS  . chlorhexidine  15 mL Mouth Rinse BID  . feeding supplement (PRO-STAT SUGAR FREE 64)  30 mL Per Tube QID  . feeding supplement (VITAL HIGH PROTEIN)  1,000 mL Per Tube Q24H  . furosemide  40 mg Intravenous Q12H  . heparin subcutaneous  5,000 Units Subcutaneous Q8H  . insulin aspart  0-15 Units Subcutaneous Q4H  . lidocaine  15 mL Mouth/Throat Once  . pantoprazole sodium  40 mg Per Tube Daily  . sodium chloride  10-40 mL Intracatheter Q12H     Past Medical History  Diagnosis Date  . Tuberculosis 15 YRS AGO     FATHER HAD TB , PT WAS MED [place]    History  Substance Use Topics  . Smoking status: Never Smoker   . Smokeless tobacco: Never Used  . Alcohol Use: No    Family History  Problem Relation Age of Onset  . Cancer Father     Allergies  Allergen Reactions  . Penicillins Rash    Objective: Temp:  [98.3 F (36.8 C)-99.5 F (37.5 C)] 98.3 F (36.8 C) (02/03 1156) Pulse Rate:  [63-103] 73 (02/03 1200) Resp:  [13-22] 14 (02/03 1200) BP: (97-134)/(45-92) 101/60 mmHg (02/03 1200) SpO2:  [96 %-100 %] 98 % (02/03 1200) FiO2 (%):  [40 %] 40 % (02/03 1200)  General: She is alert and responsive but still intubated Clean dry gauze dressing around neck  Lab Results Lab Results  Component Value Date   WBC 10.0 06/13/2013   HGB 8.2* 06/13/2013   HCT 25.7* 06/13/2013   MCV 86.2 06/13/2013   PLT 244 06/13/2013    Lab Results  Component Value Date   CREATININE 0.69 06/13/2013   BUN 16 06/13/2013   NA 143 06/13/2013   K 3.9 06/13/2013   CL 106  06/13/2013   CO2 27 06/13/2013    Lab Results  Component Value Date   ALT 10 06/10/2013   AST 7 06/10/2013   ALKPHOS 56 06/10/2013   BILITOT 0.6 06/10/2013      Microbiology: Recent Results (from the past 240 hour(s))  CULTURE, ROUTINE-ABSCESS     Status: None   Collection Time    06/04/13  1:02 PM      Result Value Range Status   Specimen Description ABSCESS   Final   Special Requests     Final   Value: PATIENT ON FOLLOWING CLINDAMYCIN RETROPHARYNGEAL ABSCESS   Gram Stain     Final   Value: MODERATE WBC PRESENT, PREDOMINANTLY PMN     RARE SQUAMOUS EPITHELIAL CELLS PRESENT     ABUNDANT GRAM POSITIVE COCCI IN PAIRS     IN CLUSTERS MODERATE GRAM NEGATIVE RODS     FEW GRAM NEGATIVE COCCI   Culture     Final   Value: FEW GROUP A STREP (S.PYOGENES) ISOLATED     Performed at [facility]  Report Status 06/07/2013 FINAL   Final  ANAEROBIC CULTURE     Status: None   Collection Time    06/04/13  1:02 PM      Result Value Range Status   Specimen Description ABSCESS   Final   Special Requests     Final   Value: PATIENT ON FOLLOWING CLINDAMYCIN RETRO PHARYNGEAL ABSCESS   Gram Stain     Final   Value: FEW WBC PRESENT, PREDOMINANTLY PMN     FEW SQUAMOUS EPITHELIAL CELLS PRESENT     FEW GRAM POSITIVE COCCI IN PAIRS     IN CLUSTERS     Performed at [facility]   Culture     Final   Value: PREVOTELLA MELANINOGENICA     Note: BETA LACTAMASE NEGATIVE     Performed at [facility]   Report Status 06/11/2013 FINAL   Final  CULTURE, ROUTINE-ABSCESS     Status: None   Collection Time    06/10/13 12:41 AM      Result Value Range Status   Specimen Description NECK LEFT ABSCESS   Final   Special Requests     Final   Value: LEFT RETROPHARYNGEAL PATIENT ON FOLLOWING ZITHROMAX   Gram Stain     Final   Value: ABUNDANT WBC PRESENT, PREDOMINANTLY PMN     FEW SQUAMOUS EPITHELIAL CELLS PRESENT     MODERATE GRAM POSITIVE COCCI IN PAIRS     IN CLUSTERS IN CHAINS      Performed at [facility]   Culture     Final   Value: MODERATE GROUP A STREP (S.PYOGENES) ISOLATED     Performed at [facility]   Report Status 06/12/2013 FINAL   Final  ANAEROBIC CULTURE     Status: None   Collection Time    06/10/13 12:41 AM      Result Value Range Status   Specimen Description ABSCESS LEFT NECK   Final   Special Requests     Final   Value: LEFT RETROPHARYNGEAL PATIENT ON FOLLOWING ZITHROMAX   Gram Stain     Final   Value: MODERATE WBC PRESENT, PREDOMINANTLY PMN     RARE SQUAMOUS EPITHELIAL CELLS PRESENT     MODERATE GRAM POSITIVE COCCI IN PAIRS     IN CLUSTERS     Performed at [facility]   Culture     Final   Value: NO ANAEROBES ISOLATED; CULTURE IN PROGRESS FOR 5 DAYS     Performed at [facility]   Report Status PENDING   Incomplete  MRSA PCR SCREENING     Status: None   Collection Time    06/10/13  1:46 AM      Result Value Range Status   MRSA by PCR NEGATIVE  NEGATIVE Final   Comment:            The GeneXpert MRSA Assay (FDA     approved for NASAL specimens     only), is one component of a     comprehensive MRSA colonization     surveillance program. It is not     intended to diagnose MRSA     infection nor to guide or     monitor treatment for     MRSA infections.  URINE CULTURE     Status: None   Collection Time    06/10/13  7:05 PM      Result Value Range Status   Specimen Description URINE, CATHETERIZED  Final   Special Requests NONE   Final   Culture  Setup Time     Final   Value: 06/11/2013 00:26     Performed at [facility]   Colony Count     Final   Value: NO GROWTH     Performed at [facility]   Culture     Final   Value: NO GROWTH     Performed at [facility]   Report Status 06/12/2013 FINAL   Final  CULTURE, RESPIRATORY (NON-EXPECTORATED)     Status: None   Collection Time    06/10/13  7:30 PM      Result Value Range Status   Specimen Description ENDOTRACHEAL  ASPIRATE   Final   Special Requests NONE   Final   Gram Stain     Final   Value: MODERATE WBC PRESENT, PREDOMINANTLY PMN     RARE SQUAMOUS EPITHELIAL CELLS PRESENT     NO ORGANISMS SEEN     Performed at [facility]   Culture     Final   Value: Culture reincubated for better growth     Performed at [facility]   Report Status PENDING   Incomplete  CULTURE, BLOOD (ROUTINE X 2)     Status: None   Collection Time    06/10/13  8:00 PM      Result Value Range Status   Specimen Description BLOOD LEFT ANTECUBITAL   Final   Special Requests BOTTLES DRAWN AEROBIC AND ANAEROBIC 10CC   Final   Culture  Setup Time     Final   Value: 06/11/2013 00:17     Performed at [facility]   Culture     Final   Value:        BLOOD CULTURE RECEIVED NO GROWTH TO DATE CULTURE WILL BE HELD FOR 5 DAYS BEFORE ISSUING A FINAL NEGATIVE REPORT     Performed at [facility]   Report Status PENDING   Incomplete  CULTURE, BLOOD (ROUTINE X 2)     Status: None   Collection Time    06/10/13  8:09 PM      Result Value Range Status   Specimen Description BLOOD LEFT HAND   Final   Special Requests BOTTLES DRAWN AEROBIC AND ANAEROBIC 10CC   Final   Culture  Setup Time     Final   Value: 06/11/2013 00:17     Performed at [facility]   Culture     Final   Value:        BLOOD CULTURE RECEIVED NO GROWTH TO DATE CULTURE WILL BE HELD FOR 5 DAYS BEFORE ISSUING A FINAL NEGATIVE REPORT     Performed at [facility]   Report Status PENDING   Incomplete    Studies/Results: Ct Soft Tissue Neck W Contrast  06/12/2013   CLINICAL DATA:  Evaluate left retropharyngeal abscess with prevertebral spread.  EXAM: CT NECK WITH CONTRAST  TECHNIQUE: Multidetector CT imaging of the neck was performed using the standard protocol following the bolus administration of intravenous contrast.  CONTRAST:  OMNIPAQUE IOHEXOL 350 MG/ML SOLN  COMPARISON:  CT NECK W/CM dated 06/09/2013; CT NECK W/CM  dated 06/04/2013; DG CHEST 1V PORT dated 06/12/2013  FINDINGS: The visualized portion of the brain is unremarkable. Visualized orbits are unremarkable. There is partial opacification of multiple ethmoid air cells bilaterally. There is moderate bilateral sphenoid sinus mucosal thickening, and there is mild bilateral maxillary sinus  mucosal thickening. Bilateral mastoid effusions are present.  Endotracheal and enteric tubes are now in place. Surgical drains are now present in the left neck extending superiorly in the region of the left lateral retropharyngeal space as well as inferiorly in the left neck into the superior mediastinum posterior to the trachea. Pooled secretions are present in the nasopharynx and oropharynx. There has been interval incision and drainage of the previously described suppurated left lateral retropharyngeal lymph node. Enlarged lateral retropharyngeal lymph nodes are again seen bilaterally, measuring approximately 1.4 cm in short axis on the right and 1.5 cm on the left, similar in size to the prior study.  There remains inflammation/ phlegmon/ edema throughout the retropharyngeal space measuring up up to approximately 1.5 cm in AP thickness, stable to minimally improved from the prior study. No discrete, organized fluid collection is identified. Inflammation remains in the parapharyngeal spaces bilaterally as well. Evaluation of the superior mediastinum for fluid/fluid collections is limited due to beam attenuation by overlying soft tissues, although there does appear to be fat stranding in the mediastinum.  The internal jugular veins appear patent. The common and cervical internal carotid arteries appear patent. Enlarged, reactive lymph nodes are again seen, the largest measuring 1.4 cm in short axis on the left in level II, not significantly changed. Subcutaneous fat stranding is present throughout the anterior and lateral neck.  A right pleural effusion remains. There is also likely a small  left pleural effusion. No acute osseous abnormality is identified.  IMPRESSION: 1. Interval incision and drainage of suppurated left lateral retropharyngeal lymph node. Persistent edema and phlegmon throughout the retropharyngeal space without definite evidence of new abscess. 2. Persistent right pleural effusion. Possible left pleural effusion.   Electronically Signed   By: Sebastian AcheAllen  Grady   On: 06/12/2013 15:24   Dg Chest Port 1 View  06/13/2013   CLINICAL DATA:  Evaluate endotracheal tube  EXAM: PORTABLE CHEST - 1 VIEW  COMPARISON:  DG CHEST 1V PORT dated 06/12/2013  FINDINGS: Mildly degraded exam due to AP portable technique and patient body habitus. Endotracheal tube 2.0 cm above carina. Nasogastric tube extends beyond the inferior aspect of the film.  Cardiomegaly accentuated by AP portable technique. Probable small bilateral pleural effusions. No pneumothorax. Extremely low lung volumes. Mild to moderate interstitial edema, accentuated by low volumes. No change in basilar predominant airspace disease bilaterally.  IMPRESSION: Given differences in lung volumes/inspiratory effort, no significant change.  Low lung volumes with interstitial edema and layering bilateral pleural effusions.  Bibasilar Airspace disease, atelectasis or infection.   Electronically Signed   By: Jeronimo GreavesKyle  Talbot M.D.   On: 06/13/2013 07:35   Dg Chest Port 1 View  06/12/2013   CLINICAL DATA:  Endotracheal tube.  EXAM: PORTABLE CHEST - 1 VIEW  COMPARISON:  06/11/2013  FINDINGS: Endotracheal tube ends in the mid thoracic trachea. Orogastric tube crosses the diaphragm. Unchanged positioning of right upper extremity PICC. Surgical packing at the left neck base persists.  Unchanged cardiopericardial enlargement. Haziness of the lower chest persists, consistent with pleural fluid, atelectasis, and possibly pneumonia based on previous CT.  IMPRESSION: 1. Good positioning of tubes and lines. 2. Unchanged small effusions and basilar atelectasis. There  could be superimposed pneumonia.   Electronically Signed   By: Tiburcio PeaJonathan  Watts M.D.   On: 06/12/2013 06:29    Assessment: CT scan shows continued retropharyngeal edema and phlegmon. All cultures indicate that this is complicated group A streptococcal infection. I will continue higher dose ceftriaxone pending results  from repeat surgery tomorrow.  Plan: 1. Increase ceftriaxone to 2 g daily  Cliffton Asters, MD [facility] [phone] pager   [phone] cell 06/13/2013, 1:35 PM

## 2013-06-13 NOTE — Progress Notes (Signed)
Name: Kimberly May MRN: [id number] DOB: 1991-03-11    ADMISSION DATE:  06/09/2013 CONSULTATION DATE: 06/10/13   REFERRING MD : Flo ShanksKarol Wolicki  PRIMARY SERVICE: ENT   CHIEF COMPLAINT: Post I&D of retropharyngeal abscess. Intubated  BRIEF PATIENT DESCRIPTION: 23 years old female with no significant PMH. Underwent I&D of retropharyngeal abscess and possible early mediastinitis. Ot of the OR intubated. PCCM consult called for mechanical ventilation management.   SIGNIFICANT EVENTS / STUDIES:  06/09/13 -> I&D retropharyngeal abscess  2/2- weaned, no leak  2/2 ct neck>>>Interval incision and drainage of suppurated left lateral  retropharyngeal lymph node. Persistent edema and phlegmon throughout  the retropharyngeal space without definite evidence of new abscess.  2/3 cxr unchanged. Bibasilar atelectasis 2/3 pro calcitonin 0.16  LINES / TUBES:  - Peripheral IV's  1-31 OTT>>  CULTURES:  - FEW GROUP A STREP (S.PYOGENES) ISOLATED FROM PRIOR I&D ON 1/25  1/31 ana>>  1/31 wound>>  ANTIBIOTICS:  1/30 Rocephin>>  -1/30 Flagyl>> 2/2 1/31 cleocin>> 2/2  SUBJECTIVE:  More awake, weaning  VITAL SIGNS: Temp:  [98.3 F (36.8 C)-99.5 F (37.5 C)] 98.3 F (36.8 C) (02/03 0803) Pulse Rate:  [63-103] 67 (02/03 0732) Resp:  [13-23] 17 (02/03 0732) BP: (105-134)/(45-92) 110/56 mmHg (02/03 0732) SpO2:  [96 %-100 %] 99 % (02/03 0732) FiO2 (%):  [40 %] 40 % (02/03 0800) HEMODYNAMICS: CVP:  [10 mmHg-78 mmHg] 12 mmHg VENTILATOR SETTINGS: Vent Mode:  [-] PSV;CPAP FiO2 (%):  [40 %] 40 % Set Rate:  [16 bmp] 16 bmp PEEP:  [5 cmH20] 5 cmH20 Pressure Support:  [5 cmH20] 5 cmH20 Plateau Pressure:  [16 cmH20-23 cmH20] 16 cmH20 INTAKE / OUTPUT: Intake/Output     02/02 0701 - 02/03 0700 02/03 0701 - 02/04 0700   I.V. (mL/kg) 1591.8 (16.8) 16 (0.2)   NG/GT 545 25   IV Piggyback 50    Total Intake(mL/kg) 2186.8 (23.1) 41 (0.4)   Urine (mL/kg/hr) 3730 (1.6) 100 (0.6)   Total Output 3730  100   Net -1543.2 -59          PHYSICAL EXAMINATION: General: awake, no distress  Neuro: nonfocal  HEENT: wound dressed, swollen PULM: CTA  CV: s1 s2 RRR  GI: soft, bs wnl , no r  Extremities: mild edema   LABS:  CBC  Recent Labs Lab 06/12/13 0420 06/12/13 2210 06/13/13 0400  WBC 9.8 10.6* 10.0  HGB 7.0* 7.9* 8.2*  HCT 21.2* 25.0* 25.7*  PLT 188 228 244   Coag's  Recent Labs Lab 06/10/13 1900 06/11/13 0500  APTT 32  --   INR  --  1.55*   BMET  Recent Labs Lab 06/11/13 0500 06/12/13 0420 06/13/13 0400  NA 143 142 143  K 3.9 3.8 3.9  CL 109 110 106  CO2 21 22 27   BUN 9 13 16   CREATININE 0.85 0.77 0.69  GLUCOSE 94 93 112*   Electrolytes  Recent Labs Lab 06/11/13 0500 06/12/13 0420 06/13/13 0400  CALCIUM 7.3* 6.8* 7.8*  MG 2.2  --  2.2  PHOS 3.9  --  4.0   Sepsis Markers  Recent Labs Lab 06/10/13 1900 06/11/13 0923 06/12/13 0420 06/13/13 0400  LATICACIDVEN 0.5  --   --   --   PROCALCITON  --  0.18 0.10 0.16   ABG  Recent Labs Lab 06/10/13 1607 06/10/13 2144 06/11/13 0441  PHART 7.556* 7.412 7.292*  PCO2ART 25.3* 33.3* 46.0*  PO2ART 113.0* 133.0* 109.0*   Liver Enzymes  Recent Labs Lab 06/09/13 1945 06/10/13 1900  AST 9 7  ALT 15 10  ALKPHOS 61 56  BILITOT 0.4 0.6  ALBUMIN 2.5* 2.0*   Cardiac Enzymes  Recent Labs Lab 06/10/13 1900  TROPONINI <0.30   Glucose  Recent Labs Lab 06/12/13 1159 06/12/13 1527 06/12/13 1901 06/12/13 2356 06/13/13 0420 06/13/13 0802  GLUCAP 101* 102* 105* 113* 101* 110*    Imaging Ct Soft Tissue Neck W Contrast  06/12/2013   CLINICAL DATA:  Evaluate left retropharyngeal abscess with prevertebral spread.  EXAM: CT NECK WITH CONTRAST  TECHNIQUE: Multidetector CT imaging of the neck was performed using the standard protocol following the bolus administration of intravenous contrast.  CONTRAST:  OMNIPAQUE IOHEXOL 350 MG/ML SOLN  COMPARISON:  CT NECK W/CM dated 06/09/2013; CT NECK  W/CM dated 06/04/2013; DG CHEST 1V PORT dated 06/12/2013  FINDINGS: The visualized portion of the brain is unremarkable. Visualized orbits are unremarkable. There is partial opacification of multiple ethmoid air cells bilaterally. There is moderate bilateral sphenoid sinus mucosal thickening, and there is mild bilateral maxillary sinus mucosal thickening. Bilateral mastoid effusions are present.  Endotracheal and enteric tubes are now in place. Surgical drains are now present in the left neck extending superiorly in the region of the left lateral retropharyngeal space as well as inferiorly in the left neck into the superior mediastinum posterior to the trachea. Pooled secretions are present in the nasopharynx and oropharynx. There has been interval incision and drainage of the previously described suppurated left lateral retropharyngeal lymph node. Enlarged lateral retropharyngeal lymph nodes are again seen bilaterally, measuring approximately 1.4 cm in short axis on the right and 1.5 cm on the left, similar in size to the prior study.  There remains inflammation/ phlegmon/ edema throughout the retropharyngeal space measuring up up to approximately 1.5 cm in AP thickness, stable to minimally improved from the prior study. No discrete, organized fluid collection is identified. Inflammation remains in the parapharyngeal spaces bilaterally as well. Evaluation of the superior mediastinum for fluid/fluid collections is limited due to beam attenuation by overlying soft tissues, although there does appear to be fat stranding in the mediastinum.  The internal jugular veins appear patent. The common and cervical internal carotid arteries appear patent. Enlarged, reactive lymph nodes are again seen, the largest measuring 1.4 cm in short axis on the left in level II, not significantly changed. Subcutaneous fat stranding is present throughout the anterior and lateral neck.  A right pleural effusion remains. There is also likely a  small left pleural effusion. No acute osseous abnormality is identified.  IMPRESSION: 1. Interval incision and drainage of suppurated left lateral retropharyngeal lymph node. Persistent edema and phlegmon throughout the retropharyngeal space without definite evidence of new abscess. 2. Persistent right pleural effusion. Possible left pleural effusion.   Electronically Signed   By: Sebastian Ache   On: 06/12/2013 15:24   Dg Chest Port 1 View  06/13/2013   CLINICAL DATA:  Evaluate endotracheal tube  EXAM: PORTABLE CHEST - 1 VIEW  COMPARISON:  DG CHEST 1V PORT dated 06/12/2013  FINDINGS: Mildly degraded exam due to AP portable technique and patient body habitus. Endotracheal tube 2.0 cm above carina. Nasogastric tube extends beyond the inferior aspect of the film.  Cardiomegaly accentuated by AP portable technique. Probable small bilateral pleural effusions. No pneumothorax. Extremely low lung volumes. Mild to moderate interstitial edema, accentuated by low volumes. No change in basilar predominant airspace disease bilaterally.  IMPRESSION: Given differences in lung volumes/inspiratory effort,  no significant change.  Low lung volumes with interstitial edema and layering bilateral pleural effusions.  Bibasilar Airspace disease, atelectasis or infection.   Electronically Signed   By: Jeronimo Greaves M.D.   On: 06/13/2013 07:35   Dg Chest Port 1 View  06/12/2013   CLINICAL DATA:  Endotracheal tube.  EXAM: PORTABLE CHEST - 1 VIEW  COMPARISON:  06/11/2013  FINDINGS: Endotracheal tube ends in the mid thoracic trachea. Orogastric tube crosses the diaphragm. Unchanged positioning of right upper extremity PICC. Surgical packing at the left neck base persists.  Unchanged cardiopericardial enlargement. Haziness of the lower chest persists, consistent with pleural fluid, atelectasis, and possibly pneumonia based on previous CT.  IMPRESSION: 1. Good positioning of tubes and lines. 2. Unchanged small effusions and basilar atelectasis.  There could be superimposed pneumonia.   Electronically Signed   By: Tiburcio Pea M.D.   On: 06/12/2013 06:29     CXR: small effusions and bibasilar atelectasis, no major changes, ett wnl  ASSESSMENT / PLAN:  PULMONARY  A:  1) Intubated post I&D of retropharyngeal abscess,ALI risk, effusion  P:  -may need steroids, await repeat look in OR -weaning cpap 5 ps 5, goal 2 hr -lasix repeat, was net neg -pcxr in am , c/w int edema mild -may require tarch  CARDIOVASCULAR  A:  Normotension, sepsis  P:  - kvo, tolerated lasix  RENAL  A:  1) edema P:  - Will follow chemistry.  - continue lasix  -chem in am   GASTROINTESTINAL  A:  1) TF  P:  - GI prophylaxis with protonix.  - continue TF  HEMATOLOGIC  A:  1) anemia, likely dilutional improved with lasix  P:  - Will follow CBC in pm after lasix further -cbc in am  -coags pre op  INFECTIOUS  A:  1) Retropharyngeal abscess - group A strep  2) Post I&D  P:  ID following  Ct done, likley back to OR Rocephin  ENDOCRINE  A:  1) No issues  NEUROLOGIC  A:  1) Intubated, sedated  P:  - Sedate for vent tolerance, WUA daily -fent  Global: no leak, no extubation, weaning, likley back to OR, and trach   Kuneff, Renee DO  I have personally obtained a history, examined the patient, evaluated laboratory and imaging results, formulated the assessment and plan and placed orders. CRITICAL CARE: The patient is critically ill with multiple organ systems failure and requires high complexity decision making for assessment and support, frequent evaluation and titration of therapies, application of advanced monitoring technologies and extensive interpretation of multiple databases. Critical Care Time devoted to patient care services described in this note is 30 minutes.   Mcarthur Rossetti. Tyson Alias, MD, FACP Pgr: [phone] [facility]  Pulmonary and Critical Care Medicine [facility] Pager: [phone]  06/13/2013, 8:45 AM

## 2013-06-14 ENCOUNTER — Encounter (HOSPITAL_COMMUNITY)
Admission: EM | Disposition: A | Discharge status: Home Health | Payer: Self-pay | Source: Home / Self Care | Attending: Otolaryngology

## 2013-06-14 ENCOUNTER — Encounter (HOSPITAL_COMMUNITY): Payer: BC Managed Care – PPO | Admitting: Anesthesiology

## 2013-06-14 ENCOUNTER — Encounter (HOSPITAL_COMMUNITY): Payer: Self-pay | Admitting: Anesthesiology

## 2013-06-14 ENCOUNTER — Inpatient Hospital Stay (HOSPITAL_COMMUNITY): Payer: BC Managed Care – PPO | Admitting: Anesthesiology

## 2013-06-14 HISTORY — PX: DIRECT LARYNGOSCOPY: SHX5326

## 2013-06-14 HISTORY — PX: TRACHEOSTOMY TUBE PLACEMENT: SHX814

## 2013-06-14 LAB — CBC
HCT: 26 % — ABNORMAL LOW (ref 36.0–46.0)
HEMATOCRIT: 25.1 % — AB (ref 36.0–46.0)
HEMOGLOBIN: 8 g/dL — AB (ref 12.0–15.0)
Hemoglobin: 8.1 g/dL — ABNORMAL LOW (ref 12.0–15.0)
MCH: 26.9 pg (ref 26.0–34.0)
MCH: 27.2 pg (ref 26.0–34.0)
MCHC: 31.2 g/dL (ref 30.0–36.0)
MCHC: 31.9 g/dL (ref 30.0–36.0)
MCV: 86.4 fL (ref 78.0–100.0)
MCV: 85.4 fL (ref 78.0–100.0)
Platelets: 244 10*3/uL (ref 150–400)
Platelets: 217 10*3/uL (ref 150–400)
RBC: 3.01 MIL/uL — AB (ref 3.87–5.11)
RBC: 2.94 MIL/uL — ABNORMAL LOW (ref 3.87–5.11)
RDW: 13.5 % (ref 11.5–15.5)
RDW: 13.2 % (ref 11.5–15.5)
WBC: 8.9 10*3/uL (ref 4.0–10.5)
WBC: 10 10*3/uL (ref 4.0–10.5)

## 2013-06-14 LAB — GLUCOSE, CAPILLARY
GLUCOSE-CAPILLARY: 102 mg/dL — AB (ref 70–99)
GLUCOSE-CAPILLARY: 104 mg/dL — AB (ref 70–99)
GLUCOSE-CAPILLARY: 108 mg/dL — AB (ref 70–99)
GLUCOSE-CAPILLARY: 94 mg/dL (ref 70–99)
Glucose-Capillary: 100 mg/dL — ABNORMAL HIGH (ref 70–99)
Glucose-Capillary: 94 mg/dL (ref 70–99)

## 2013-06-14 LAB — CREATININE, SERUM
Creatinine, Ser: 0.57 mg/dL (ref 0.50–1.10)
GFR calc Af Amer: >90 mL/min (ref >90)
GFR calc non Af Amer: >90 mL/min (ref >90)

## 2013-06-14 LAB — BASIC METABOLIC PANEL
BUN: 19 mg/dL (ref 6–23)
CALCIUM: 8 mg/dL — AB (ref 8.4–10.5)
CO2: 31 mEq/L (ref 19–32)
Chloride: 102 mEq/L (ref 96–112)
Creatinine, Ser: 0.64 mg/dL (ref 0.50–1.10)
Glucose, Bld: 97 mg/dL (ref 70–99)
POTASSIUM: 3.7 meq/L (ref 3.7–5.3)
Sodium: 142 mEq/L (ref 137–147)

## 2013-06-14 LAB — TRIGLYCERIDES: Triglycerides: 141 mg/dL (ref <150)

## 2013-06-14 SURGERY — CREATION, TRACHEOSTOMY
Anesthesia: General | Site: Neck

## 2013-06-14 MED ORDER — ROCURONIUM BROMIDE 50 MG/5ML IV SOLN
INTRAVENOUS | Status: AC
  Filled 2013-06-14: qty 1

## 2013-06-14 MED ORDER — MIDAZOLAM HCL 5 MG/5ML IJ SOLN
INTRAMUSCULAR | Status: DC | PRN
  Administered 2013-06-14: 2 mg via INTRAVENOUS

## 2013-06-14 MED ORDER — FENTANYL CITRATE 0.05 MG/ML IJ SOLN
100.0000 ug | INTRAMUSCULAR | Status: DC | PRN
  Administered 2013-06-15: 100 ug via INTRAVENOUS
  Filled 2013-06-14: qty 2

## 2013-06-14 MED ORDER — LACTATED RINGERS IV SOLN
INTRAVENOUS | Status: DC | PRN
  Administered 2013-06-14: 13:00:00 via INTRAVENOUS

## 2013-06-14 MED ORDER — PHENYLEPHRINE 40 MCG/ML (10ML) SYRINGE FOR IV PUSH (FOR BLOOD PRESSURE SUPPORT)
PREFILLED_SYRINGE | INTRAVENOUS | Status: AC
  Filled 2013-06-14: qty 10

## 2013-06-14 MED ORDER — ONDANSETRON HCL 4 MG/2ML IJ SOLN
INTRAMUSCULAR | Status: DC | PRN
  Administered 2013-06-14: 4 mg via INTRAVENOUS

## 2013-06-14 MED ORDER — PROPOFOL 10 MG/ML IV EMUL
INTRAVENOUS | Status: AC
Start: 2013-06-14 — End: 2013-06-14
  Filled 2013-06-14: qty 100

## 2013-06-14 MED ORDER — FENTANYL CITRATE 0.05 MG/ML IJ SOLN
INTRAMUSCULAR | Status: DC | PRN
  Administered 2013-06-14 (×2): 50 ug via INTRAVENOUS
  Administered 2013-06-14: 100 ug via INTRAVENOUS
  Administered 2013-06-14: 50 ug via INTRAVENOUS

## 2013-06-14 MED ORDER — MIDAZOLAM HCL 2 MG/2ML IJ SOLN
INTRAMUSCULAR | Status: AC
  Filled 2013-06-14: qty 2

## 2013-06-14 MED ORDER — FENTANYL CITRATE 0.05 MG/ML IJ SOLN
INTRAMUSCULAR | Status: AC
  Filled 2013-06-14: qty 5

## 2013-06-14 MED ORDER — ARTIFICIAL TEARS OP OINT
TOPICAL_OINTMENT | OPHTHALMIC | Status: DC | PRN
  Administered 2013-06-14: 1 via OPHTHALMIC

## 2013-06-14 MED ORDER — PROPOFOL 10 MG/ML IV BOLUS
INTRAVENOUS | Status: DC | PRN
  Administered 2013-06-14 (×2): 50 mg via INTRAVENOUS

## 2013-06-14 MED ORDER — ONDANSETRON HCL 4 MG/2ML IJ SOLN
INTRAMUSCULAR | Status: AC
  Filled 2013-06-14: qty 2

## 2013-06-14 MED ORDER — LIDOCAINE-EPINEPHRINE 1 %-1:100000 IJ SOLN
INTRAMUSCULAR | Status: DC | PRN
  Administered 2013-06-14: 10 mL

## 2013-06-14 MED ORDER — PROPOFOL 10 MG/ML IV EMUL
0.0000 ug/kg/min | INTRAVENOUS | Status: DC
Start: 2013-06-14 — End: 2013-06-15
  Administered 2013-06-15 (×2): 40 ug/kg/min via INTRAVENOUS
  Filled 2013-06-14 (×2): qty 100

## 2013-06-14 MED ORDER — HEPARIN SODIUM (PORCINE) 5000 UNIT/ML IJ SOLN
5000.0000 [IU] | Freq: Three times a day (TID) | INTRAMUSCULAR | Status: DC
  Filled 2013-06-14 (×7): qty 1

## 2013-06-14 MED ORDER — SODIUM CHLORIDE 0.9 % IV BOLUS (SEPSIS)
500.0000 mL | Freq: Once | INTRAVENOUS | Status: AC
  Administered 2013-06-14: 500 mL via INTRAVENOUS

## 2013-06-14 MED ORDER — PROPOFOL 10 MG/ML IV BOLUS
INTRAVENOUS | Status: AC
  Filled 2013-06-14: qty 20

## 2013-06-14 MED ORDER — ROCURONIUM BROMIDE 100 MG/10ML IV SOLN
INTRAVENOUS | Status: DC | PRN
Start: 2013-06-14 — End: 2013-06-14
  Administered 2013-06-14: 30 mg via INTRAVENOUS
  Administered 2013-06-14: 50 mg via INTRAVENOUS

## 2013-06-14 SURGICAL SUPPLY — 45 items
BANDAGE ADHESIVE 1X3 (GAUZE/BANDAGES/DRESSINGS) ×2 IMPLANT
BANDAGE GAUZE ELAST BULKY 4 IN (GAUZE/BANDAGES/DRESSINGS) ×2 IMPLANT
CANISTER SUCTION 2500CC (MISCELLANEOUS) ×3 IMPLANT
CATH ROBINSON RED A/P 16FR (CATHETERS) ×2 IMPLANT
CLEANER TIP ELECTROSURG 2X2 (MISCELLANEOUS) ×3 IMPLANT
COVER SURGICAL LIGHT HANDLE (MISCELLANEOUS) ×3 IMPLANT
COVER TABLE BACK 60X90 (DRAPES) ×3 IMPLANT
CRADLE DONUT ADULT HEAD (MISCELLANEOUS) ×2 IMPLANT
DECANTER SPIKE VIAL GLASS SM (MISCELLANEOUS) ×3 IMPLANT
DRAIN PENROSE 3/4X12 (DRAIN) ×4 IMPLANT
DRAPE PROXIMA HALF (DRAPES) ×3 IMPLANT
ELECT COATED BLADE 2.86 ST (ELECTRODE) ×3 IMPLANT
ELECT REM PT RETURN 9FT ADLT (ELECTROSURGICAL) ×3
ELECTRODE REM PT RTRN 9FT ADLT (ELECTROSURGICAL) ×1 IMPLANT
GLOVE BIO SURGEON STRL SZ8 (GLOVE) ×2 IMPLANT
GLOVE BIOGEL PI IND STRL 7.0 (GLOVE) IMPLANT
GLOVE BIOGEL PI INDICATOR 7.0 (GLOVE) ×2
GLOVE ECLIPSE 8.0 STRL XLNG CF (GLOVE) ×6 IMPLANT
GLOVE SURG SS PI 7.0 STRL IVOR (GLOVE) ×4 IMPLANT
GOWN PREVENTION PLUS XLARGE (GOWN DISPOSABLE) ×3 IMPLANT
GOWN SRG XL XLNG 56XLVL 4 (GOWN DISPOSABLE) ×1 IMPLANT
GOWN STRL NON-REIN LRG LVL3 (GOWN DISPOSABLE) ×3 IMPLANT
GOWN STRL NON-REIN XL XLG LVL4 (GOWN DISPOSABLE) ×3
GUARD TEETH (MISCELLANEOUS) ×3 IMPLANT
KIT BASIN OR (CUSTOM PROCEDURE TRAY) ×3 IMPLANT
KIT ROOM TURNOVER OR (KITS) ×3 IMPLANT
NS IRRIG 1000ML POUR BTL (IV SOLUTION) ×3 IMPLANT
PAD ARMBOARD 7.5X6 YLW CONV (MISCELLANEOUS) ×6 IMPLANT
PATTIES SURGICAL .5 X3 (DISPOSABLE) ×2 IMPLANT
PENCIL BUTTON HOLSTER BLD 10FT (ELECTRODE) ×5 IMPLANT
SPONGE DRAIN TRACH 4X4 STRL 2S (GAUZE/BANDAGES/DRESSINGS) ×3 IMPLANT
SUT CHROMIC 2 0 SH (SUTURE) ×3 IMPLANT
SUT ETHILON 2 0 FS 18 (SUTURE) IMPLANT
SUT SILK 2 0 SH CR/8 (SUTURE) ×3 IMPLANT
SWAB COLLECTION DEVICE MRSA (MISCELLANEOUS) ×4 IMPLANT
SYR BULB IRRIGATION 50ML (SYRINGE) ×2 IMPLANT
TOWEL OR 17X24 6PK STRL BLUE (TOWEL DISPOSABLE) ×3 IMPLANT
TOWEL OR 17X26 10 PK STRL BLUE (TOWEL DISPOSABLE) ×3 IMPLANT
TRAP SPECIMEN MUCOUS 40CC (MISCELLANEOUS) ×2 IMPLANT
TRAY ENT MC OR (CUSTOM PROCEDURE TRAY) ×3 IMPLANT
TUBE CONNECTING 12'X1/4 (SUCTIONS) ×1
TUBE CONNECTING 12X1/4 (SUCTIONS) ×2 IMPLANT
TUBE TRACH 6.0 EXL PROX CUF (TUBING) ×2 IMPLANT
WATER STERILE IRR 1000ML POUR (IV SOLUTION) ×3 IMPLANT
YANKAUER SUCT BULB TIP NO VENT (SUCTIONS) ×2 IMPLANT

## 2013-06-14 NOTE — Progress Notes (Signed)
Patient ID: Kimberly May, female   DOB: 11-11-1990, 23 y.o.   MRN: [id number] Patient ID: Kimberly May, female   DOB: March 24, 1991, 23 y.o.   MRN: [id number]         [facility]    Date of Admission:  06/09/2013   Total days of antibiotics 21        Day 5 ceftriaxone         Active Problems:   Retropharyngeal abscess   Acute respiratory failure   Septic shock   ARF (acute renal failure)   . antiseptic oral rinse  15 mL Mouth Rinse QID  . cefTRIAXone (ROCEPHIN)  IV  2 g Intravenous Q24H  . chlorhexidine  15 mL Mouth Rinse BID  . feeding supplement (PRO-STAT SUGAR FREE 64)  30 mL Per Tube QID  . feeding supplement (VITAL HIGH PROTEIN)  1,000 mL Per Tube Q24H  . heparin subcutaneous  5,000 Units Subcutaneous Q8H  . insulin aspart  0-15 Units Subcutaneous Q4H  . lidocaine  15 mL Mouth/Throat Once  . pantoprazole sodium  40 mg Per Tube Daily  . sodium chloride  10-40 mL Intracatheter Q12H    Objective: Temp:  [97.9 F (36.6 C)-99.1 F (37.3 C)] 99.1 F (37.3 C) (02/04 1146) Pulse Rate:  [69-95] 71 (02/04 1100) Resp:  [12-19] 15 (02/04 1100) BP: (86-138)/(47-111) 126/74 mmHg (02/04 1100) SpO2:  [96 %-100 %] 100 % (02/04 1100) FiO2 (%):  [40 %] 40 % (02/04 1100)  Lab Results Lab Results  Component Value Date   WBC 8.9 06/14/2013   HGB 8.1* 06/14/2013   HCT 26.0* 06/14/2013   MCV 86.4 06/14/2013   PLT 244 06/14/2013    Lab Results  Component Value Date   CREATININE 0.64 06/14/2013   BUN 19 06/14/2013   NA 142 06/14/2013   K 3.7 06/14/2013   CL 102 06/14/2013   CO2 31 06/14/2013    Lab Results  Component Value Date   ALT 10 06/10/2013   AST 7 06/10/2013   ALKPHOS 56 06/10/2013   BILITOT 0.6 06/10/2013      Microbiology: Recent Results (from the past 240 hour(s))  CULTURE, ROUTINE-ABSCESS     Status: None   Collection Time    06/04/13  1:02 PM      Result Value Range Status   Specimen Description ABSCESS   Final   Special Requests     Final   Value:  PATIENT ON FOLLOWING CLINDAMYCIN RETROPHARYNGEAL ABSCESS   Gram Stain     Final   Value: MODERATE WBC PRESENT, PREDOMINANTLY PMN     RARE SQUAMOUS EPITHELIAL CELLS PRESENT     ABUNDANT GRAM POSITIVE COCCI IN PAIRS     IN CLUSTERS MODERATE GRAM NEGATIVE RODS     FEW GRAM NEGATIVE COCCI   Culture     Final   Value: FEW GROUP A STREP (S.PYOGENES) ISOLATED     Performed at [facility]   Report Status 06/07/2013 FINAL   Final  ANAEROBIC CULTURE     Status: None   Collection Time    06/04/13  1:02 PM      Result Value Range Status   Specimen Description ABSCESS   Final   Special Requests     Final   Value: PATIENT ON FOLLOWING CLINDAMYCIN RETRO PHARYNGEAL ABSCESS   Gram Stain     Final   Value: FEW WBC PRESENT, PREDOMINANTLY PMN     FEW SQUAMOUS EPITHELIAL CELLS  PRESENT     FEW GRAM POSITIVE COCCI IN PAIRS     IN CLUSTERS     Performed at [facility]   Culture     Final   Value: PREVOTELLA MELANINOGENICA     Note: BETA LACTAMASE NEGATIVE     Performed at [facility]   Report Status 06/11/2013 FINAL   Final  CULTURE, ROUTINE-ABSCESS     Status: None   Collection Time    06/10/13 12:41 AM      Result Value Range Status   Specimen Description NECK LEFT ABSCESS   Final   Special Requests     Final   Value: LEFT RETROPHARYNGEAL PATIENT ON FOLLOWING ZITHROMAX   Gram Stain     Final   Value: ABUNDANT WBC PRESENT, PREDOMINANTLY PMN     FEW SQUAMOUS EPITHELIAL CELLS PRESENT     MODERATE GRAM POSITIVE COCCI IN PAIRS     IN CLUSTERS IN CHAINS     Performed at [facility]   Culture     Final   Value: MODERATE GROUP A STREP (S.PYOGENES) ISOLATED     Performed at [facility]   Report Status 06/12/2013 FINAL   Final  ANAEROBIC CULTURE     Status: None   Collection Time    06/10/13 12:41 AM      Result Value Range Status   Specimen Description ABSCESS LEFT NECK   Final   Special Requests     Final   Value: LEFT RETROPHARYNGEAL PATIENT ON  FOLLOWING ZITHROMAX   Gram Stain     Final   Value: MODERATE WBC PRESENT, PREDOMINANTLY PMN     RARE SQUAMOUS EPITHELIAL CELLS PRESENT     MODERATE GRAM POSITIVE COCCI IN PAIRS     IN CLUSTERS     Performed at [facility]   Culture     Final   Value: NO ANAEROBES ISOLATED; CULTURE IN PROGRESS FOR 5 DAYS     Performed at [facility]   Report Status PENDING   Incomplete  MRSA PCR SCREENING     Status: None   Collection Time    06/10/13  1:46 AM      Result Value Range Status   MRSA by PCR NEGATIVE  NEGATIVE Final   Comment:            The GeneXpert MRSA Assay (FDA     approved for NASAL specimens     only), is one component of a     comprehensive MRSA colonization     surveillance program. It is not     intended to diagnose MRSA     infection nor to guide or     monitor treatment for     MRSA infections.  URINE CULTURE     Status: None   Collection Time    06/10/13  7:05 PM      Result Value Range Status   Specimen Description URINE, CATHETERIZED   Final   Special Requests NONE   Final   Culture  Setup Time     Final   Value: 06/11/2013 00:26     Performed at [facility]   Colony Count     Final   Value: NO GROWTH     Performed at [facility]   Culture     Final   Value: NO GROWTH     Performed at [facility]   Report Status 06/12/2013 FINAL  Final  CULTURE, RESPIRATORY (NON-EXPECTORATED)     Status: None   Collection Time    06/10/13  7:30 PM      Result Value Range Status   Specimen Description ENDOTRACHEAL ASPIRATE   Final   Special Requests NONE   Final   Gram Stain     Final   Value: MODERATE WBC PRESENT, PREDOMINANTLY PMN     RARE SQUAMOUS EPITHELIAL CELLS PRESENT     NO ORGANISMS SEEN     [facility]   Culture     Final   Value: Non-Pathogenic Oropharyngeal-type Flora Isolated.     [facility] at [facility]   Report Status 06/13/2013 FINAL   Final  CULTURE, BLOOD (ROUTINE X 2)      Status: None   Collection Time    06/10/13  8:00 PM      Result Value Range Status   Specimen Description BLOOD LEFT ANTECUBITAL   Final   Special Requests BOTTLES DRAWN AEROBIC AND ANAEROBIC 10CC   Final   Culture  Setup Time     Final   Value: 06/11/2013 00:17     [facility]   Culture     Final   Value:        BLOOD CULTURE RECEIVED NO GROWTH TO DATE CULTURE WILL BE HELD FOR 5 DAYS BEFORE ISSUING A FINAL NEGATIVE REPORT     [facility]   Report Status PENDING   Incomplete  CULTURE, BLOOD (ROUTINE X 2)     Status: None   Collection Time    06/10/13  8:09 PM      Result Value Range Status   Specimen Description BLOOD LEFT HAND   Final   Special Requests BOTTLES DRAWN AEROBIC AND ANAEROBIC 10CC   Final   Culture  Setup Time     Final   Value: 06/11/2013 00:17     [facility]   Culture     Final   Value:        BLOOD CULTURE RECEIVED NO GROWTH TO DATE CULTURE WILL BE HELD FOR 5 DAYS BEFORE ISSUING A FINAL NEGATIVE REPORT     [facility]   Report Status PENDING   Incomplete    Studies/Results: Ct Soft Tissue Neck W Contrast  06/12/2013   CLINICAL DATA:  Evaluate left retropharyngeal abscess with prevertebral spread.  EXAM: CT NECK WITH CONTRAST  TECHNIQUE: Multidetector CT imaging of the neck was performed using the standard protocol following the bolus administration of intravenous contrast.  CONTRAST:  100mL OMNIPAQUE IOHEXOL 350 MG/ML SOLN  COMPARISON:  CT NECK W/CM dated 06/09/2013; CT NECK W/CM dated 06/04/2013; DG CHEST 1V PORT dated 06/12/2013  FINDINGS: The visualized portion of the brain is unremarkable. Visualized orbits are unremarkable. There is partial opacification of multiple ethmoid air cells bilaterally. There is moderate bilateral sphenoid sinus mucosal thickening, and there is mild bilateral maxillary sinus mucosal thickening. Bilateral mastoid effusions are present.  Endotracheal and  enteric tubes are now in place. Surgical drains are now present in the left neck extending superiorly in the region of the left lateral retropharyngeal space as well as inferiorly in the left neck into the superior mediastinum posterior to the trachea. Pooled secretions are present in the nasopharynx and oropharynx. There has been interval incision and drainage of the previously described suppurated left lateral retropharyngeal lymph node. Enlarged lateral retropharyngeal lymph nodes are again seen bilaterally,  measuring approximately 1.4 cm in short axis on the right and 1.5 cm on the left, similar in size to the prior study.  There remains inflammation/ phlegmon/ edema throughout the retropharyngeal space measuring up up to approximately 1.5 cm in AP thickness, stable to minimally improved from the prior study. No discrete, organized fluid collection is identified. Inflammation remains in the parapharyngeal spaces bilaterally as well. Evaluation of the superior mediastinum for fluid/fluid collections is limited due to beam attenuation by overlying soft tissues, although there does appear to be fat stranding in the mediastinum.  The internal jugular veins appear patent. The common and cervical internal carotid arteries appear patent. Enlarged, reactive lymph nodes are again seen, the largest measuring 1.4 cm in short axis on the left in level II, not significantly changed. Subcutaneous fat stranding is present throughout the anterior and lateral neck.  A right pleural effusion remains. There is also likely a small left pleural effusion. No acute osseous abnormality is identified.  IMPRESSION: 1. Interval incision and drainage of suppurated left lateral retropharyngeal lymph node. Persistent edema and phlegmon throughout the retropharyngeal space without definite evidence of new abscess. 2. Persistent right pleural effusion. Possible left pleural effusion.   Electronically Signed   By: Sebastian Ache   On: 06/12/2013  15:24   Dg Chest Port 1 View  06/13/2013   CLINICAL DATA:  Evaluate endotracheal tube  EXAM: PORTABLE CHEST - 1 VIEW  COMPARISON:  DG CHEST 1V PORT dated 06/12/2013  FINDINGS: Mildly degraded exam due to AP portable technique and patient body habitus. Endotracheal tube 2.0 cm above carina. Nasogastric tube extends beyond the inferior aspect of the film.  Cardiomegaly accentuated by AP portable technique. Probable small bilateral pleural effusions. No pneumothorax. Extremely low lung volumes. Mild to moderate interstitial edema, accentuated by low volumes. No change in basilar predominant airspace disease bilaterally.  IMPRESSION: Given differences in lung volumes/inspiratory effort, no significant change.  Low lung volumes with interstitial edema and layering bilateral pleural effusions.  Bibasilar Airspace disease, atelectasis or infection.   Electronically Signed   By: Jeronimo Greaves M.D.   On: 06/13/2013 07:35    Assessment: I will continue ceftriaxone pending results of repeat incision and drainage today.  Plan: 1. Continue ceftriaxone  Cliffton Asters, MD [facility] [phone] pager   [phone] cell 06/14/2013, 12:21 PM

## 2013-06-14 NOTE — Anesthesia Procedure Notes (Signed)
Date/Time: 06/14/2013 1:20 PM Performed by: Dairl PonderJIANG, Creig Landin Preoxygenation: Pre-oxygenation with 100% oxygen Laryngoscope size: Curcuit connected to preexisting ETT.

## 2013-06-14 NOTE — Progress Notes (Signed)
Name: Kimberly May MRN: [id number] DOB: 03-Sep-1990    ADMISSION DATE:  06/09/2013 CONSULTATION DATE: 06/10/13   REFERRING MD : Flo Shanks  PRIMARY SERVICE: ENT   CHIEF COMPLAINT: Post I&D of retropharyngeal abscess. Intubated  BRIEF PATIENT DESCRIPTION: 23 years old female with no significant PMH. Underwent I&D of retropharyngeal abscess and possible early mediastinitis. Ot of the OR intubated. PCCM consult called for mechanical ventilation management.   SIGNIFICANT EVENTS / STUDIES:  06/09/13 -> I&D retropharyngeal abscess  2/2- weaned, no leak  2/2 ct neck>>>Interval incision and drainage of suppurated left lateral  retropharyngeal lymph node. Persistent edema and phlegmon throughout  the retropharyngeal space without definite evidence of new abscess. 2/3 cxr unchanged. Bibasilar atelectasis 2/3 pro calcitonin 0.16  LINES / TUBES:  - Peripheral IV's  1-31 OTT>>  CULTURES:  - FEW GROUP A STREP (S.PYOGENES) ISOLATED FROM PRIOR I&D ON 1/25  1/31 ana>> NGTD 1/31 wound>> Moderate strep pyogenes  ANTIBIOTICS:  1/30 Rocephin>>  -1/30 Flagyl>> 2/2 1/31 cleocin>> 2/2  SUBJECTIVE: Did well overnight. Feels she woke up with a right sided lazy eye. She wants to get up to bedside chair.   VITAL SIGNS: Temp:  [97.9 F (36.6 C)-98.6 F (37 C)] 98.5 F (36.9 C) (02/04 0416) Pulse Rate:  [67-91] 73 (02/04 0600) Resp:  [12-19] 14 (02/04 0600) BP: (93-120)/(48-70) 107/48 mmHg (02/04 0600) SpO2:  [96 %-100 %] 98 % (02/04 0600) FiO2 (%):  [40 %] 40 % (02/04 0309) HEMODYNAMICS: CVP:  [13 mmHg-17 mmHg] 13 mmHg VENTILATOR SETTINGS: Vent Mode:  [-] PCV FiO2 (%):  [40 %] 40 % Set Rate:  [16 bmp] 16 bmp PEEP:  [5 cmH20] 5 cmH20 Pressure Support:  [5 cmH20-8 cmH20] 8 cmH20 Plateau Pressure:  [21 cmH20-23 cmH20] 21 cmH20 INTAKE / OUTPUT: Intake/Output     02/03 0701 - 02/04 0700   I.V. (mL/kg) 794.1 (8.4)   NG/GT 375   IV Piggyback 100   Total Intake(mL/kg) 1269.1 (13.4)    Urine (mL/kg/hr) 2580 (1.1)   Total Output 2580   Net -1310.9        BP 107/48  Pulse 73  Temp(Src) 98.5 F (36.9 C) (Oral)  Resp 14  Ht 5' (1.524 m)  Wt 209 lb (94.802 kg)  BMI 40.82 kg/m2  SpO2 98%  LMP 06/08/2013  PHYSICAL EXAMINATION: General: awake, no distress  Neuro: nonfocal  HEENT: wound dressed, swollen, bilateral eyes with injections. Rt eye swollen, with mild ptosis, swelling PULM: CTA  CV: s1 s2 RRR  GI: soft, bs hypoactive , no r  Extremities: mild edema   LABS:  CBC  Recent Labs Lab 06/12/13 2210 06/13/13 0400 06/14/13 0400  WBC 10.6* 10.0 8.9  HGB 7.9* 8.2* 8.1*  HCT 25.0* 25.7* 26.0*  PLT 228 244 244   Coag's  Recent Labs Lab 06/10/13 1900 06/11/13 0500 06/13/13 1030  APTT 32  --  29  INR  --  1.55* 1.27   BMET  Recent Labs Lab 06/11/13 0500 06/12/13 0420 06/13/13 0400  NA 143 142 143  K 3.9 3.8 3.9  CL 109 110 106  CO2 21 22 27   BUN 9 13 16   CREATININE 0.85 0.77 0.69  GLUCOSE 94 93 112*   Electrolytes  Recent Labs Lab 06/11/13 0500 06/12/13 0420 06/13/13 0400  CALCIUM 7.3* 6.8* 7.8*  MG 2.2  --  2.2  PHOS 3.9  --  4.0   Sepsis Markers  Recent Labs Lab 06/10/13 1900 06/11/13 1478  06/12/13 0420 06/13/13 0400  LATICACIDVEN 0.5  --   --   --   PROCALCITON  --  0.18 0.10 0.16   ABG  Recent Labs Lab 06/10/13 1607 06/10/13 2144 06/11/13 0441  PHART 7.556* 7.412 7.292*  PCO2ART 25.3* 33.3* 46.0*  PO2ART 113.0* 133.0* 109.0*   Liver Enzymes  Recent Labs Lab 06/09/13 1945 06/10/13 1900  AST 9 7  ALT 15 10  ALKPHOS 61 56  BILITOT 0.4 0.6  ALBUMIN 2.5* 2.0*   Cardiac Enzymes  Recent Labs Lab 06/10/13 1900  TROPONINI <0.30   Glucose  Recent Labs Lab 06/13/13 0802 06/13/13 1116 06/13/13 1526 06/13/13 1856 06/13/13 2339 06/14/13 0343  GLUCAP 110* 116* 106* 109* 108* 104*    Imaging Ct Soft Tissue Neck W Contrast  06/12/2013   CLINICAL DATA:  Evaluate left retropharyngeal abscess  with prevertebral spread.  EXAM: CT NECK WITH CONTRAST  TECHNIQUE: Multidetector CT imaging of the neck was performed using the standard protocol following the bolus administration of intravenous contrast.  CONTRAST:  100mL OMNIPAQUE IOHEXOL 350 MG/ML SOLN  COMPARISON:  CT NECK W/CM dated 06/09/2013; CT NECK W/CM dated 06/04/2013; DG CHEST 1V PORT dated 06/12/2013  FINDINGS: The visualized portion of the brain is unremarkable. Visualized orbits are unremarkable. There is partial opacification of multiple ethmoid air cells bilaterally. There is moderate bilateral sphenoid sinus mucosal thickening, and there is mild bilateral maxillary sinus mucosal thickening. Bilateral mastoid effusions are present.  Endotracheal and enteric tubes are now in place. Surgical drains are now present in the left neck extending superiorly in the region of the left lateral retropharyngeal space as well as inferiorly in the left neck into the superior mediastinum posterior to the trachea. Pooled secretions are present in the nasopharynx and oropharynx. There has been interval incision and drainage of the previously described suppurated left lateral retropharyngeal lymph node. Enlarged lateral retropharyngeal lymph nodes are again seen bilaterally, measuring approximately 1.4 cm in short axis on the right and 1.5 cm on the left, similar in size to the prior study.  There remains inflammation/ phlegmon/ edema throughout the retropharyngeal space measuring up up to approximately 1.5 cm in AP thickness, stable to minimally improved from the prior study. No discrete, organized fluid collection is identified. Inflammation remains in the parapharyngeal spaces bilaterally as well. Evaluation of the superior mediastinum for fluid/fluid collections is limited due to beam attenuation by overlying soft tissues, although there does appear to be fat stranding in the mediastinum.  The internal jugular veins appear patent. The common and cervical internal  carotid arteries appear patent. Enlarged, reactive lymph nodes are again seen, the largest measuring 1.4 cm in short axis on the left in level II, not significantly changed. Subcutaneous fat stranding is present throughout the anterior and lateral neck.  A right pleural effusion remains. There is also likely a small left pleural effusion. No acute osseous abnormality is identified.  IMPRESSION: 1. Interval incision and drainage of suppurated left lateral retropharyngeal lymph node. Persistent edema and phlegmon throughout the retropharyngeal space without definite evidence of new abscess. 2. Persistent right pleural effusion. Possible left pleural effusion.   Electronically Signed   By: Sebastian AcheAllen  Grady   On: 06/12/2013 15:24   Dg Chest Port 1 View  06/13/2013   CLINICAL DATA:  Evaluate endotracheal tube  EXAM: PORTABLE CHEST - 1 VIEW  COMPARISON:  DG CHEST 1V PORT dated 06/12/2013  FINDINGS: Mildly degraded exam due to AP portable technique and patient body habitus. Endotracheal tube  2.0 cm above carina. Nasogastric tube extends beyond the inferior aspect of the film.  Cardiomegaly accentuated by AP portable technique. Probable small bilateral pleural effusions. No pneumothorax. Extremely low lung volumes. Mild to moderate interstitial edema, accentuated by low volumes. No change in basilar predominant airspace disease bilaterally.  IMPRESSION: Given differences in lung volumes/inspiratory effort, no significant change.  Low lung volumes with interstitial edema and layering bilateral pleural effusions.  Bibasilar Airspace disease, atelectasis or infection.   Electronically Signed   By: Jeronimo Greaves M.D.   On: 06/13/2013 07:35     CXR: small effusions and bibasilar atelectasis, no major changes, ett wnl  ASSESSMENT / PLAN:  PULMONARY  A:  1) Intubated post I&D of retropharyngeal abscess,ALI risk, effusion  P:  -weaning pos trach is plan, likley then straight to Tach collar -can do cpap5 ps 5 this am pre  trach -lasix see renal likely trach today in OR pcxr pos trach  CARDIOVASCULAR  A:  Normotension, sepsis  P:  - kvo  RENAL  A:  1) edema, effusions P:  - Will follow chemistry.  - continue lasix  -chem now and in am   GASTROINTESTINAL  A:  1) TF  P:  - GI prophylaxis with protonix.  - held TF for OR  HEMATOLOGIC  A:  1) anemia, likely dilutional improved with lasix  P:  - Will follow CBC in pm after lasix further -cbc daily  - coags wnl yesterday   INFECTIOUS  A:  1) Retropharyngeal abscess - group A strep  2) Post I&D  No fever spikes P:  ID following  Ct done, likley back to OR, will assess post op note and findgins Rocephin  ENDOCRINE  A:  1) No issues  NEUROLOGIC  A:  1) Intubated P:  - Sedate for vent tolerance, WUA daily - fent and diprivan  Global lasix, bmet now, likley to OR, liklet  Pos trach can Trach collar   Kuneff, Renee DO  I have personally obtained a history, examined the patient, evaluated laboratory and imaging results, formulated the assessment and plan and placed orders. CRITICAL CARE: The patient is critically ill with multiple organ systems failure and requires high complexity decision making for assessment and support, frequent evaluation and titration of therapies, application of advanced monitoring technologies and extensive interpretation of multiple databases. Critical Care Time devoted to patient care services described in this note is 30 minutes.    06/14/2013, 6:56 AM Mcarthur Rossetti. Tyson Alias, MD, FACP Pgr: [phone] [facility]

## 2013-06-14 NOTE — Progress Notes (Signed)
06/14/2013 9:04 AM  Cresenciano GenrePruitt, [place] [id number]  Post-Op Day 4    Temp:  [97.9 F (36.6 C)-98.9 F (37.2 C)] 98.9 F (37.2 C) (02/04 0754) Pulse Rate:  [69-95] 79 (02/04 0800) Resp:  [12-19] 15 (02/04 0800) BP: (93-138)/(47-111) 101/47 mmHg (02/04 0800) SpO2:  [96 %-100 %] 100 % (02/04 0800) FiO2 (%):  [40 %] 40 % (02/04 0800),     Intake/Output Summary (Last 24 hours) at 06/14/13 0904 Last data filed at 06/14/13 0600  Gross per 24 hour  Intake 1167.1 ml  Output   2480 ml  Net -1312.9 ml    Results for orders placed during the hospital encounter of 06/09/13 (from the past 24 hour(s))  APTT     Status: None   Collection Time    06/13/13 10:30 AM      Result Value Range   aPTT 29  24 - 37 seconds  PROTIME-INR     Status: Abnormal   Collection Time    06/13/13 10:30 AM      Result Value Range   Prothrombin Time 15.6 (*) 11.6 - 15.2 seconds   INR 1.27  0.00 - 1.49  GLUCOSE, CAPILLARY     Status: Abnormal   Collection Time    06/13/13 11:16 AM      Result Value Range   Glucose-Capillary 116 (*) 70 - 99 mg/dL  GLUCOSE, CAPILLARY     Status: Abnormal   Collection Time    06/13/13  3:26 PM      Result Value Range   Glucose-Capillary 106 (*) 70 - 99 mg/dL  GLUCOSE, CAPILLARY     Status: Abnormal   Collection Time    06/13/13  6:56 PM      Result Value Range   Glucose-Capillary 109 (*) 70 - 99 mg/dL  GLUCOSE, CAPILLARY     Status: Abnormal   Collection Time    06/13/13 11:39 PM      Result Value Range   Glucose-Capillary 108 (*) 70 - 99 mg/dL   Comment 1 Notify RN    GLUCOSE, CAPILLARY     Status: Abnormal   Collection Time    06/14/13  3:43 AM      Result Value Range   Glucose-Capillary 104 (*) 70 - 99 mg/dL  CBC     Status: Abnormal   Collection Time    06/14/13  4:00 AM      Result Value Range   WBC 8.9  4.0 - 10.5 K/uL   RBC 3.01 (*) 3.87 - 5.11 MIL/uL   Hemoglobin 8.1 (*) 12.0 - 15.0 g/dL   HCT 40.926.0 (*) 81.136.0 - 91.446.0 %   MCV 86.4  78.0 - 100.0 fL   MCH  26.9  26.0 - 34.0 pg   MCHC 31.2  30.0 - 36.0 g/dL   RDW 78.213.5  95.611.5 - 21.315.5 %   Platelets 244  150 - 400 K/uL  GLUCOSE, CAPILLARY     Status: Abnormal   Collection Time    06/14/13  7:53 AM      Result Value Range   Glucose-Capillary 102 (*) 70 - 99 mg/dL   PT, PTT, platelets all normal.  Failed ETT leak test, again.  SUBJECTIVE:  Neck pain no change.  Swallows a little.    OBJECTIVE:  Less neck drainage.  Lateral subconjunctival hemorrhage OU, with intact ROM and visiou OU.    IMPRESSION:  Overall clinically a little improved, but still airway issues  PLAN:  Proceed with DL,  trach, LEFT neck exploration later today.  Discussed with patient.  Consent obtained.  Flo Shanks

## 2013-06-14 NOTE — Anesthesia Postprocedure Evaluation (Signed)
  Anesthesia Post-op Note  Patient: Kimberly May  Procedure(s) Performed: Procedure(s): TRACHEOSTOMY (N/A) DIRECT LARYNGOSCOPY/RE-EXPLORATORY OF NECK (N/A)  Patient Location: PACU  Anesthesia Type:General  Level of Consciousness: sedated  Airway and Oxygen Therapy: Patient remains intubated per anesthesia plan and Patient placed on Ventilator (see vital sign flow sheet for setting)  Post-op Pain: none  Post-op Assessment: Post-op Vital signs reviewed, Patient's Cardiovascular Status Stable, Respiratory Function Stable, Patent Airway, No signs of Nausea or vomiting and Pain level controlled  Post-op Vital Signs: Reviewed and stable  Complications: No apparent anesthesia complications

## 2013-06-14 NOTE — Progress Notes (Signed)
1. BP soft, Ur Op dropping -> Rx 500cc fluid bolus  2. Sedation  Order fell off per RN - > rewrote fent gtt and diprivan gtt order   Dr. Kalman ShanMurali Naseer Hearn, M.D., [facility].C.P Pulmonary and Critical Care Medicine Staff Physician [facility] [place] [facility] Pager: [phone], If no answer or between  15:00h - 7:00h: call [phone]  06/14/2013 10:07 PM

## 2013-06-14 NOTE — Op Note (Signed)
06/14/2013  3:08 PM    [place], [place]  [id number]   Pre-Op Dx:  LEFT retropharyngeal abscess with deep neck infection  Post-op Dx: same  Proc: Direct Laryngoscopy, Tracheostomy, LEFT neck exploration   Surg:  Flo ShanksWOLICKI, Aairah Negrette T MD  Anes:  GOT to GTracheostomal  EBL:    25 ml  Comp:  none  Findings:  Posterior laryngeal edema, with intact glottis.   Large amt posterior pharyngeal wall edema.  Fatty lower neck with bulky thyroid isthmus.  Fibrinous exudate in LEFT neck, but no identified loculations of infection.  Procedure: With the patient in a comfortable supine position already intubated from the ICU, general anesthesia was administered.  At an appropriate level, a clean preparation and draping was accomplished. A rubber tooth guard was placed. The anterior commissure laryngoscope was introduced taking care to protect lips, teeth, and endotracheal tube. There was some cloudy exudate in the pharynx and copious posterior wall edema and the oro-and hypopharynx. Some translucent edema over the arytenoids and posterior aryepiglottic folds. Basically intact glottis. Full direct laryngoscopy was performed. The laryngoscope and tooth guard were removed. The dental status was intact.  A shoulder roll was applied. The neck was extended and the head supported in the standard fashion. A lower midline neck was palpated with the findings as described above. 1% Xylocaine with 1 100,000 epinephrine, 10 mL total was infiltrated into the anticipated tracheostomy site.  Several minutes were allowed for this to take effect. A sterile preparation and draping of the low neck was accomplished.  A 4 cm transverse incision was made approximately 2 cm above the cricoid notch. Carried down through skin, abundant subcutaneous fat, superficial layer of the deep cervical fascia. The midline raphae of the strap muscle was identified and divided vertically. Muscles were retracted laterally. The thyroid isthmus was  identified. Dissection superior and inferior to the isthmus down to the trachea was accomplished. The thyroid was isolated between hemostats, divided, and controlled with 2-0 silk suture ligatures. Hemostasis was observed.  The anterior face of the trachea was cleared. In what was probably the 2-3 interspace, a 1 cm transverse incision was sharply executed and the airway was entered. Cut mucosal edges were cauterized for hemostasis. The opening was dilated with a hemostat. The inferior ring was secured to the lower wound with a 2-0 chromic stitch.  A previously tested 6 cuffed Shiley XL T. (proximal) tracheostomy tube was placed into the tracheal lumen after backing out the endotracheal tube carefully. The tube was in good location. The cuff was observed to be intact and containing air. Ventilation was assumed per tracheostomy tube. There was mild oozing from the right thyroid isthmus  which was controlled with cautery.  The tracheostomy tube was secured to the neck skin with 2-0 silk stitches. The orotracheal tube was removed.  The neck drapes were adjusted. The left neck wound was opened and the Penrose drains were removed. With finger dissection, the prevertebral plane was opened down to the thoracic inlet again and the dissection was carried up to the base of skull and across to the right side. Several small nodes in the left neck were encountered but no frank pus was identified. The wound was thoroughly irrigated with saline. 1/2 inch Penrose drains were once again placed inferiorly into the thoracic inlet, and superiorly to the base of skull. These were secured at the wound with 2-0 Ethilon suture. Hemostasis was observed.  Again the tracheostomy site was observed be hemostatic and ventilating well. The patient's  neck was cleaned and an absorbent four-inch Kerlix dressing was placed. At this point the procedure was completed. Patient was returned to anesthesia, awakened, and transferred back to the  2100 intensive Care unit in stable condition.   Dispo:   OR directly to 2100   Plan:  Continue IV antibiosis. We'll begin lowering tracheostomy cuff and letting her speak and eat soon. We will probably repeat flexible laryngoscopy early next week in anticipation of early trach decannulation.   Cephus Richer MD

## 2013-06-14 NOTE — Transfer of Care (Signed)
Immediate Anesthesia Transfer of Care Note  Patient: Kimberly May  Procedure(s) Performed: Procedure(s): TRACHEOSTOMY (N/A) DIRECT LARYNGOSCOPY/RE-EXPLORATORY OF NECK (N/A)  Patient Location: ICU  Anesthesia Type:General  Level of Consciousness: Patient remains intubated per anesthesia plan  Airway & Oxygen Therapy: Patient remains intubated per anesthesia plan  Post-op Assessment: Report given to PACU RN  Post vital signs: Reviewed and stable  Complications: No apparent anesthesia complications

## 2013-06-14 NOTE — Anesthesia Preprocedure Evaluation (Signed)
Anesthesia Evaluation  Patient identified by MRN, date of birth, ID band Patient awake    Reviewed: Allergy & Precautions, H&P , NPO status , Patient's Chart, lab work & pertinent test results, reviewed documented beta blocker date and time   Airway Mallampati: II TM Distance: >3 FB Neck ROM: full    Dental   Pulmonary shortness of breath,  + rhonchi     + intubated    Cardiovascular negative cardio ROS  Rhythm:regular     Neuro/Psych negative neurological ROS  negative psych ROS   GI/Hepatic negative GI ROS, Neg liver ROS,   Endo/Other  Morbid obesity  Renal/GU ARFRenal diseasenegative Renal ROS  negative genitourinary   Musculoskeletal   Abdominal   Peds  Hematology negative hematology ROS (+)   Anesthesia Other Findings See surgeon's H&P   Reproductive/Obstetrics negative OB ROS                           Anesthesia Physical Anesthesia Plan  ASA: III  Anesthesia Plan: General   Post-op Pain Management:    Induction: Intravenous  Airway Management Planned: Oral ETT and Tracheostomy  Additional Equipment:   Intra-op Plan:   Post-operative Plan: Post-operative intubation/ventilation  Informed Consent: I have reviewed the patients History and Physical, chart, labs and discussed the procedure including the risks, benefits and alternatives for the proposed anesthesia with the patient or authorized representative who has indicated his/her understanding and acceptance.   Dental Advisory Given  Plan Discussed with: CRNA and Surgeon  Anesthesia Plan Comments:         Anesthesia Quick Evaluation

## 2013-06-14 NOTE — Progress Notes (Signed)
Notified Dr. Marchelle Gearingamaswamy of decreased UO for day.  500ml bolus ordered.  Clarified whether to place ngt or not.  States, "hold off until am".  Report given to [facility] RN.

## 2013-06-15 ENCOUNTER — Inpatient Hospital Stay (HOSPITAL_COMMUNITY): Payer: BC Managed Care – PPO

## 2013-06-15 DIAGNOSIS — J9851 Mediastinitis: Secondary | ICD-10-CM

## 2013-06-15 LAB — CBC WITH DIFFERENTIAL/PLATELET
Basophils Absolute: 0 10*3/uL (ref 0.0–0.1)
Basophils Relative: 0 % (ref 0–1)
EOS ABS: 0.1 10*3/uL (ref 0.0–0.7)
EOS PCT: 1 % (ref 0–5)
HCT: 25.8 % — ABNORMAL LOW (ref 36.0–46.0)
HEMOGLOBIN: 8.3 g/dL — AB (ref 12.0–15.0)
LYMPHS ABS: 1.5 10*3/uL (ref 0.7–4.0)
LYMPHS PCT: 16 % (ref 12–46)
MCH: 27.7 pg (ref 26.0–34.0)
MCHC: 32.2 g/dL (ref 30.0–36.0)
MCV: 86 fL (ref 78.0–100.0)
Monocytes Absolute: 1 10*3/uL (ref 0.1–1.0)
Monocytes Relative: 10 % (ref 3–12)
Neutro Abs: 6.9 10*3/uL (ref 1.7–7.7)
Neutrophils Relative %: 72 % (ref 43–77)
Platelets: 181 10*3/uL (ref 150–400)
RBC: 3 MIL/uL — AB (ref 3.87–5.11)
RDW: 13.2 % (ref 11.5–15.5)
WBC: 9.5 10*3/uL (ref 4.0–10.5)

## 2013-06-15 LAB — GLUCOSE, CAPILLARY
Glucose-Capillary: 101 mg/dL — ABNORMAL HIGH (ref 70–99)
Glucose-Capillary: 103 mg/dL — ABNORMAL HIGH (ref 70–99)
Glucose-Capillary: 104 mg/dL — ABNORMAL HIGH (ref 70–99)
Glucose-Capillary: 105 mg/dL — ABNORMAL HIGH (ref 70–99)
Glucose-Capillary: 88 mg/dL (ref 70–99)
Glucose-Capillary: 98 mg/dL (ref 70–99)

## 2013-06-15 LAB — BASIC METABOLIC PANEL
BUN: 12 mg/dL (ref 6–23)
CO2: 28 meq/L (ref 19–32)
Calcium: 7.9 mg/dL — ABNORMAL LOW (ref 8.4–10.5)
Chloride: 104 mEq/L (ref 96–112)
Creatinine, Ser: 0.57 mg/dL (ref 0.50–1.10)
GFR calc Af Amer: >90 mL/min (ref >90)
GLUCOSE: 102 mg/dL — AB (ref 70–99)
POTASSIUM: 3.9 meq/L (ref 3.7–5.3)
Sodium: 143 mEq/L (ref 137–147)

## 2013-06-15 LAB — ANAEROBIC CULTURE

## 2013-06-15 LAB — MAGNESIUM: Magnesium: 2.2 mg/dL (ref 1.5–2.5)

## 2013-06-15 LAB — PHOSPHORUS: PHOSPHORUS: 3.4 mg/dL (ref 2.3–4.6)

## 2013-06-15 MED ORDER — PANTOPRAZOLE SODIUM 40 MG IV SOLR
40.0000 mg | INTRAVENOUS | Status: DC
  Administered 2013-06-15: 40 mg via INTRAVENOUS
  Filled 2013-06-15 (×2): qty 40

## 2013-06-15 MED ORDER — WHITE PETROLATUM GEL
Status: AC
  Administered 2013-06-15: 0.2
  Filled 2013-06-15: qty 5

## 2013-06-15 MED ORDER — FUROSEMIDE 10 MG/ML IJ SOLN
20.0000 mg | Freq: Two times a day (BID) | INTRAMUSCULAR | Status: DC
  Administered 2013-06-15 (×2): 20 mg via INTRAVENOUS
  Filled 2013-06-15 (×4): qty 2

## 2013-06-15 MED ORDER — MAGIC MOUTHWASH
5.0000 mL | Freq: Four times a day (QID) | ORAL | Status: AC
  Administered 2013-06-16 – 2013-06-20 (×11): 5 mL via ORAL
  Filled 2013-06-15 (×23): qty 5

## 2013-06-15 NOTE — Progress Notes (Signed)
   RN calling eLink - c/o sores in mouth  PLAN Magic mouth wash x 5 days    Dr. Kalman ShanMurali Ellenore Roscoe, M.D., [facility].P Pulmonary and Critical Care Medicine Staff Physician [facility] [place] [facility] Pager: [phone], If no answer or between  15:00h - 7:00h: call [phone]  06/15/2013 10:37 PM

## 2013-06-15 NOTE — Progress Notes (Signed)
Patient ID: Kimberly T Cure, female   DOB: Aug 05, 1990, 23 y.o.   MRN: [id number]  Name: Kimberly May MRN: [id number] DOB: Aug 05, 1990    ADMISSION DATE:  06/09/2013 CONSULTATION DATE: 06/10/13   REFERRING MD : Flo ShanksKarol Wolicki  PRIMARY SERVICE: ENT   CHIEF COMPLAINT: Post I&D of retropharyngeal abscess. Intubated  BRIEF PATIENT DESCRIPTION: 23 years old female with no significant PMH. Underwent I&D of retropharyngeal abscess and possible early mediastinitis. Ot of the OR intubated. PCCM consult called for mechanical ventilation management.   SIGNIFICANT EVENTS / STUDIES:  06/09/13 -> I&D retropharyngeal abscess  2/2- weaned, no leak  2/2 ct neck>>>Interval incision and drainage of suppurated left lateral  retropharyngeal lymph node. Persistent edema and phlegmon throughout  the retropharyngeal space without definite evidence of new abscess. 2/3 cxr unchanged. Bibasilar atelectasis 2/3 pro calcitonin 0.16 2/4 --> re-exploration of wound and trach placement 2/5 trach collar  LINES / TUBES:  - Peripheral IV's  - PICC 1/31 >> - foley 1-31 OTT>>2/3 2/3 trach  2/4 trach collar  CULTURES:  - FEW GROUP A STREP (S.PYOGENES) ISOLATED FROM PRIOR I&D ON 1/25  1/31 ana>> NGTD 1/31 wound>> Moderate strep pyogenes  ANTIBIOTICS:  1/30 Rocephin>>  -1/30 Flagyl>> 2/2 1/31 cleocin>> 2/2  SUBJECTIVE: Soft BP overnight, awake  VITAL SIGNS: Temp:  [98.6 F (37 C)-99.5 F (37.5 C)] 99.4 F (37.4 C) (02/05 0406) Pulse Rate:  [71-105] 82 (02/05 0700) Resp:  [15-28] 28 (02/05 0700) BP: (86-163)/(44-111) 100/56 mmHg (02/05 0700) SpO2:  [93 %-100 %] 100 % (02/05 0700) FiO2 (%):  [40 %] 40 % (02/05 0400) Weight:  [243 lb 13.3 oz (110.6 kg)] 243 lb 13.3 oz (110.6 kg) (02/05 0600) HEMODYNAMICS:   VENTILATOR SETTINGS: Vent Mode:  [-] PCV FiO2 (%):  [40 %] 40 % Set Rate:  [16 bmp] 16 bmp PEEP:  [5 cmH20] 5 cmH20 Pressure Support:  [5 cmH20-8 cmH20] 8 cmH20 Plateau Pressure:  [14 cmH20-18  cmH20] 14 cmH20 INTAKE / OUTPUT: Intake/Output     02/04 0701 - 02/05 0700 02/05 0701 - 02/06 0700   I.V. (mL/kg) 1761.3 (15.9)    NG/GT     IV Piggyback 550    Total Intake(mL/kg) 2311.3 (20.9)    Urine (mL/kg/hr) 905 (0.3)    Total Output 905     Net +1406.3          Stool Occurrence 2 x     BP 100/56  Pulse 82  Temp(Src) 99.4 F (37.4 C) (Oral)  Resp 28  Ht 5' (1.524 m)  Wt 243 lb 13.3 oz (110.6 kg)  BMI 47.62 kg/m2  SpO2 100%  LMP 06/08/2013  PHYSICAL EXAMINATION: General: awake, no distress, mildly anxious today Neuro: nonfocal  HEENT: wound dressed, swollen, bilateral eyes with injections. Trach collar PULM: CTA  CV: s1 s2 RRR  GI: soft, bs hypoactive , no r  Extremities: mild edema   LABS:  CBC  Recent Labs Lab 06/13/13 0400 06/14/13 0400 06/14/13 2145  WBC 10.0 8.9 10.0  HGB 8.2* 8.1* 8.0*  HCT 25.7* 26.0* 25.1*  PLT 244 244 217   Coag's  Recent Labs Lab 06/10/13 1900 06/11/13 0500 06/13/13 1030  APTT 32  --  29  INR  --  1.55* 1.27   BMET  Recent Labs Lab 06/12/13 0420 06/13/13 0400 06/14/13 0900 06/14/13 2145  NA 142 143 142  --   K 3.8 3.9 3.7  --   CL 110 106 102  --   CO2  22 27 31   --   BUN 13 16 19   --   CREATININE 0.77 0.69 0.64 0.57  GLUCOSE 93 112* 97  --    Electrolytes  Recent Labs Lab 06/11/13 0500 06/12/13 0420 06/13/13 0400 06/14/13 0900  CALCIUM 7.3* 6.8* 7.8* 8.0*  MG 2.2  --  2.2  --   PHOS 3.9  --  4.0  --    Sepsis Markers  Recent Labs Lab 06/10/13 1900 06/11/13 0923 06/12/13 0420 06/13/13 0400  LATICACIDVEN 0.5  --   --   --   PROCALCITON  --  0.18 0.10 0.16   ABG  Recent Labs Lab 06/10/13 1607 06/10/13 2144 06/11/13 0441  PHART 7.556* 7.412 7.292*  PCO2ART 25.3* 33.3* 46.0*  PO2ART 113.0* 133.0* 109.0*   Liver Enzymes  Recent Labs Lab 06/09/13 1945 06/10/13 1900  AST 9 7  ALT 15 10  ALKPHOS 61 56  BILITOT 0.4 0.6  ALBUMIN 2.5* 2.0*   Cardiac Enzymes  Recent Labs Lab  06/10/13 1900  TROPONINI <0.30   Glucose  Recent Labs Lab 06/14/13 0753 06/14/13 1139 06/14/13 1539 06/14/13 1907 06/14/13 2346 06/15/13 0405  GLUCAP 102* 94 100* 94 88 98    Imaging No results found.   CXR: small effusions and bibasilar atelectasis, no major changes, ett wnl  ASSESSMENT / PLAN:  PULMONARY  A:  1) Intubated post I&D of retropharyngeal abscess,ALI risk, effusion, pulm edema P:  -Tach collar as goal would like to avoid vent further -consider cuff reduction -would NOT pmv today -lasix see renal pcxr pos trach ordered today - c/w edema, consider firther lasix  CARDIOVASCULAR  A:  Normotension, sepsis  R/o pa htn, osa? P:  - a few soft BP overnight, overall normotensive -lasix -echo  RENAL  A:  1) edema, effusions P:  - Will follow chemistry.  - continue lasix to neg balance  GASTROINTESTINAL  A:   1) GI proph, dysphagia P:  - GI prophylaxis with protonix.  -slp, if fail then ngt  HEMATOLOGIC  A:  1) anemia, likely dilutional improved with lasix  P:  -cbc and BMP  Daily, with lasix use -hep sub q -mobilize  INFECTIOUS  A:  1) Retropharyngeal abscess - group A strep  2) Post I&D  No fever spikes P:  ID following  Rocephin continued p-op re-exploration yesterday with Trach placement reviewed  ENDOCRINE  A:  1) No issues  NEUROLOGIC  A:  1) Trach collar P:  - fent off -PT  Summary:  Trach collar tolerated this morning. Up to chair today. Lasix, echo for pa pressures, NO PMV   Kuneff, Renee DO  Ccm time 30 min  I have fully examined this patient and agree with above findings.      Mcarthur Rossetti. Tyson Alias, MD, FACP Pgr: [phone] [place] [facility]

## 2013-06-15 NOTE — Evaluation (Signed)
Clinical/Bedside Swallow Evaluation Patient Details  Name: Kimberly May MRN: [id number] Date of Birth: 09-May-1991  Today's Date: 06/15/2013 Time: 1130-1205 SLP Time Calculation (min): 35 min  Past Medical History:  Past Medical History  Diagnosis Date  . Tuberculosis 15 YRS AGO     FATHER HAD TB , PT WAS MED [place]   Past Surgical History:  Past Surgical History  Procedure Laterality Date  . Tympanostomy tube placement  DONE TWICE     BILAT    [facility]  . Cesarean section  2009   . Tonsillectomy  04/10/2011    Procedure: TONSILLECTOMY;  Surgeon: Antony Contraswight D Bates;  Location: [facility] OR;  Service: ENT;  Laterality: Bilateral;  . Incision and drainage abscess N/A 06/04/2013    Procedure: INCISION AND DRAINAGE PHARYNGEAL ABSCESS;  Surgeon: Melvenia BeamMitchell Gore, MD;  [facility];  Service: ENT;  Laterality: N/A;  . Adenoidectomy    . Incision and drainage of peritonsillar abcess Left 06/09/2013    Procedure: INCISION AND DRAINAGE OF left retropharyneal abscess;  Surgeon: Flo ShanksKarol Wolicki, MD;  [facility];  Service: ENT;  Laterality: Left;   HPI:  23 years old female with no significant PMH. Underwent I&D of retropharyngeal abscess and possible early mediastinitis 06/09/13. 2/2 s/p interval incision and drainage of suppurative left lateral retropharyngeal lymph node. Persistent edema and phlegmom throughout the retropharyngeal space without evidence of new abscess. 2/4 re-exploration of would with trach placement. Discussed patient with Dr. Lazarus SalinesWolicki who reported non-patent upper airway with trach cuff deflation. Wishes to hold PMSV evaluation until 2/6 however wants to proceed with bedside swallow eval to determine need for non-oral means of nutrition.    Assessment / Plan / Recommendation Clinical Impression  Bedside swallow evaluation complete. Patient with an intact appearing oropharyngeal swallow although oral phase impacted by lingual swelling and pain (? ulcer on tip) impacting ROM and  multiple swallows suspicious for pharyngeal residuals which would not be unexpected with pharyngeal edema documented. DEspite above, no overt s/s of aspiration noted with patient reporting discomfort only with oral phase. Given admitting diagnosis and high aspiration risk, will proceed with objective evaluation to determine potential to advance diet. Discussed with CCM MD who is in agreement.     Aspiration Risk  Severe    Diet Recommendation NPO   Medication Administration: Via alternative means    Other  Recommendations Recommended Consults: MBS Oral Care Recommendations: Oral care Q4 per protocol   Follow Up Recommendations   (TBD)    Frequency and Duration        Pertinent Vitals/Pain n/a     Swallow Study Prior Functional Status       General HPI: 23 years old female with no significant PMH. Underwent I&D of retropharyngeal abscess and possible early mediastinitis 06/09/13. 2/2 s/p interval incision and drainage of suppurative left lateral retropharyngeal lymph node. Persistent edema and phlegmom throughout the retropharyngeal space without evidence of new abscess. 2/4 re-exploration of would with trach placement. Discussed patient with Dr. Lazarus SalinesWolicki who reported non-patent upper airway with trach cuff deflation. Wishes to hold PMSV evaluation until 2/6 however wants to proceed with bedside swallow eval to determine need for non-oral means of nutrition.  Type of Study: Bedside swallow evaluation Previous Swallow Assessment: none Diet Prior to this Study: NPO Temperature Spikes Noted: No Respiratory Status: Trach collar Trach Size and Type: #6;Cuff History of Recent Intubation: Yes Length of Intubations (days): 4 days Date extubated: 06/13/13 Oral Cavity - Dentition: Adequate natural dentition Self-Feeding  Abilities: Able to feed self Patient Positioning: Upright in bed Baseline Vocal Quality: Aphonic (non-patient upper airway, no pmsv in place) Volitional Cough:  Strong Volitional Swallow: Able to elicit (mild facial grimacing)    Oral/Motor/Sensory Function Overall Oral Motor/Sensory Function:  (mildly decreased ROM, lingual swelling, c/o sore tongue tip)   Ice Chips Ice chips: Impaired Presentation: Spoon Oral Phase Functional Implications: Prolonged oral transit Pharyngeal Phase Impairments:  (multiple swallows)   Thin Liquid Thin Liquid: Impaired Presentation: Cup Oral Phase Impairments: Impaired anterior to posterior transit Oral Phase Functional Implications: Prolonged oral transit Pharyngeal  Phase Impairments: Multiple swallows Other Comments: facial grimacing, c/o "getting stuck"    Nectar Thick Nectar Thick Liquid: Not tested   Honey Thick Honey Thick Liquid: Not tested   Puree Puree: Not tested   Solid   GO   Charo Philipp MA, CCC-SLP [phone]  Solid: Not tested       Dailin Sosnowski Meryl 06/15/2013,1:52 PM

## 2013-06-15 NOTE — Progress Notes (Signed)
06/15/2013 10:16 AM  Kimberly May May, Kimberly May [id number]  Post-Op Day 5/1    Temp:  [98.6 F (37 C)-99.5 F (37.5 C)] 98.9 F (37.2 C) (02/05 0753) Pulse Rate:  [71-105] 79 (02/05 0722) Resp:  [15-28] 22 (02/05 0722) BP: (95-163)/(44-111) 100/56 mmHg (02/05 0722) SpO2:  [93 %-100 %] 100 % (02/05 0722) FiO2 (%):  [40 %] 40 % (02/05 0722) Weight:  [110.6 kg (243 lb 13.3 oz)] 110.6 kg (243 lb 13.3 oz) (02/05 0600),     Intake/Output Summary (Last 24 hours) at 06/15/13 1016 Last data filed at 06/15/13 0600  Gross per 24 hour  Intake 2233.57 ml  Output    855 ml  Net 1378.57 ml    Results for orders placed during the hospital encounter of 06/09/13 (from the past 24 hour(s))  GLUCOSE, CAPILLARY     Status: None   Collection Time    06/14/13 11:39 AM      Result Value Range   Glucose-Capillary 94  70 - 99 mg/dL  GLUCOSE, CAPILLARY     Status: Abnormal   Collection Time    06/14/13  3:39 PM      Result Value Range   Glucose-Capillary 100 (*) 70 - 99 mg/dL  GLUCOSE, CAPILLARY     Status: None   Collection Time    06/14/13  7:07 PM      Result Value Range   Glucose-Capillary 94  70 - 99 mg/dL  CBC     Status: Abnormal   Collection Time    06/14/13  9:45 PM      Result Value Range   WBC 10.0  4.0 - 10.5 K/uL   RBC 2.94 (*) 3.87 - 5.11 MIL/uL   Hemoglobin 8.0 (*) 12.0 - 15.0 g/dL   HCT 40.925.1 (*) 81.136.0 - 91.446.0 %   MCV 85.4  78.0 - 100.0 fL   MCH 27.2  26.0 - 34.0 pg   MCHC 31.9  30.0 - 36.0 g/dL   RDW 78.213.2  95.611.5 - 21.315.5 %   Platelets 217  150 - 400 K/uL  CREATININE, SERUM     Status: None   Collection Time    06/14/13  9:45 PM      Result Value Range   Creatinine, Ser 0.57  0.50 - 1.10 mg/dL   GFR calc non Af Amer >90  >90 mL/min   GFR calc Af Amer >90  >90 mL/min  TRIGLYCERIDES     Status: None   Collection Time    06/14/13  9:45 PM      Result Value Range   Triglycerides 141  <150 mg/dL  GLUCOSE, CAPILLARY     Status: None   Collection Time    06/14/13 11:46 PM   Result Value Range   Glucose-Capillary 88  70 - 99 mg/dL   Comment 1 Documented in Chart     Comment 2 Notify RN    GLUCOSE, CAPILLARY     Status: None   Collection Time    06/15/13  4:05 AM      Result Value Range   Glucose-Capillary 98  70 - 99 mg/dL  GLUCOSE, CAPILLARY     Status: Abnormal   Collection Time    06/15/13  7:56 AM      Result Value Range   Glucose-Capillary 103 (*) 70 - 99 mg/dL  CBC WITH DIFFERENTIAL     Status: Abnormal   Collection Time    06/15/13  8:00 AM      Result  Value Range   WBC 9.5  4.0 - 10.5 K/uL   RBC 3.00 (*) 3.87 - 5.11 MIL/uL   Hemoglobin 8.3 (*) 12.0 - 15.0 g/dL   HCT 40.9 (*) 81.1 - 91.4 %   MCV 86.0  78.0 - 100.0 fL   MCH 27.7  26.0 - 34.0 pg   MCHC 32.2  30.0 - 36.0 g/dL   RDW 78.2  95.6 - 21.3 %   Platelets 181  150 - 400 K/uL   Neutrophils Relative % 72  43 - 77 %   Neutro Abs 6.9  1.7 - 7.7 K/uL   Lymphocytes Relative 16  12 - 46 %   Lymphs Abs 1.5  0.7 - 4.0 K/uL   Monocytes Relative 10  3 - 12 %   Monocytes Absolute 1.0  0.1 - 1.0 K/uL   Eosinophils Relative 1  0 - 5 %   Eosinophils Absolute 0.1  0.0 - 0.7 K/uL   Basophils Relative 0  0 - 1 %   Basophils Absolute 0.0  0.0 - 0.1 K/uL  BASIC METABOLIC PANEL     Status: Abnormal   Collection Time    06/15/13  8:00 AM      Result Value Range   Sodium 143  137 - 147 mEq/L   Potassium 3.9  3.7 - 5.3 mEq/L   Chloride 104  96 - 112 mEq/L   CO2 28  19 - 32 mEq/L   Glucose, Bld 102 (*) 70 - 99 mg/dL   BUN 12  6 - 23 mg/dL   Creatinine, Ser 0.86  0.50 - 1.10 mg/dL   Calcium 7.9 (*) 8.4 - 10.5 mg/dL   GFR calc non Af Amer >90  >90 mL/min   GFR calc Af Amer >90  >90 mL/min    SUBJECTIVE:  Dislikes trach.  OBJECTIVE:  No voice with cuff deflated.  Mod serous neck drainage.  Mod swelling.  IMPRESSION:  stable  PLAN:  Will work toward po intake, possible voice, trach cuff down if possible  Out of bed.  Out of ICU.  Will repeat flexible laryngoscopy next week to make decisions  about trach downsize, eventual decannulation.  Flo Shanks

## 2013-06-15 NOTE — Procedures (Signed)
Objective Swallowing Evaluation: Modified Barium Swallowing Study  Patient Details  Name: Kimberly May MRN: [id number] Date of Birth: [birth year]  Today's Date: 06/15/2013 Time: [id number] SLP Time Calculation (min): 20 min  Past Medical History:  Past Medical History  Diagnosis Date  . Tuberculosis 15 YRS AGO     FATHER HAD TB , PT WAS MED [place]   Past Surgical History:  Past Surgical History  Procedure Laterality Date  . Tympanostomy tube placement  DONE TWICE     BILAT    [facility]  . Cesarean section  2009   . Tonsillectomy  04/10/2011    Procedure: TONSILLECTOMY;  Surgeon: Antony Contraswight D Bates;  Location: [facility] OR;  Service: ENT;  Laterality: Bilateral;  . Incision and drainage abscess N/A 06/04/2013    Procedure: INCISION AND DRAINAGE PHARYNGEAL ABSCESS;  Surgeon: Melvenia BeamMitchell Gore, MD;  [facility];  Service: ENT;  Laterality: N/A;  . Adenoidectomy    . Incision and drainage of peritonsillar abcess Left 06/09/2013    Procedure: INCISION AND DRAINAGE OF left retropharyneal abscess;  Surgeon: Flo ShanksKarol Wolicki, MD;  [facility];  Service: ENT;  Laterality: Left;   HPI:  23 years old female with no significant PMH. Underwent I&D of retropharyngeal abscess and possible early mediastinitis 06/09/13. 2/2 s/p interval incision and drainage of suppurative left lateral retropharyngeal lymph node. Persistent edema and phlegmom throughout the retropharyngeal space without evidence of new abscess. 2/4 re-exploration of would with trach placement. Discussed patient with Dr. Lazarus SalinesWolicki who reported non-patent upper airway with trach cuff deflation. Wishes to hold PMSV evaluation until 2/6 however wants to proceed with bedside swallow eval to determine need for non-oral means of nutrition.      Assessment / Plan / Recommendation Clinical Impression  Dysphagia Diagnosis: Severe pharyngeal phase dysphagia;Severe cervical esophageal phase dysphagia Clinical impression: Patient presents with mild oral (due  to c/o lingual pain/edema) and a severe pharyngo-esophageal dysphagia characterized by delayed oral transit and severe retropharyngeal and posterior pharyngeal wall edema resulting in decreased epiglottic deflection and UES relaxation and leading to silent aspiration of thin liquids. Question nerve involvement given silent nature of aspirates. SLP provided cueing for effortful swallow and hard cough, neither of which were successful to prevent, decrease, or clear aspirates. RN deep suctioned following evaluation. Provided education regarding results and plan. Prognosis for improvement good with decreased edema. SLP will continue to f/u.     Treatment Recommendation  Therapy as outlined in treatment plan below    Diet Recommendation NPO;Alternative means - temporary   Medication Administration: Via alternative means    Other  Recommendations Recommended Consults: MBS (3-5 days) Oral Care Recommendations: Oral care Q4 per protocol   Follow Up Recommendations   (TBD)    Frequency and Duration min 3x week  2 weeks        General HPI: 23 years old female with no significant PMH. Underwent I&D of retropharyngeal abscess and possible early mediastinitis 06/09/13. 2/2 s/p interval incision and drainage of suppurative left lateral retropharyngeal lymph node. Persistent edema and phlegmom throughout the retropharyngeal space without evidence of new abscess. 2/4 re-exploration of would with trach placement. Discussed patient with Dr. Lazarus SalinesWolicki who reported non-patent upper airway with trach cuff deflation. Wishes to hold PMSV evaluation until 2/6 however wants to proceed with bedside swallow eval to determine need for non-oral means of nutrition.  Type of Study: Modified Barium Swallowing Study Reason for Referral: Objectively evaluate swallowing function Previous Swallow Assessment: none Diet Prior to this  Study: NPO Temperature Spikes Noted: No Respiratory Status: Trach collar Trach Size and Type:  #6;Cuff History of Recent Intubation: Yes Length of Intubations (days): 4 days Date extubated: 06/13/13 Behavior/Cognition: Alert;Cooperative;Pleasant mood Oral Cavity - Dentition: Adequate natural dentition Oral Motor / Sensory Function: Impaired - see Bedside swallow eval Self-Feeding Abilities: Able to feed self Patient Positioning: Upright in chair Baseline Vocal Quality: Aphonic (non-patient upper airway, no pmsv in place) Volitional Cough: Strong Volitional Swallow: Able to elicit (mild facial grimacing) Anatomy:  (severe retropharyngeal and posterior pharyngeal wall edema) Pharyngeal Secretions: Not observed secondary MBS    Reason for Referral Objectively evaluate swallowing function   Oral Phase Oral Preparation/Oral Phase Oral Phase: Impaired Oral - Thin Oral - Thin Teaspoon: Left anterior bolus loss;Right anterior bolus loss;Reduced posterior propulsion;Delayed oral transit;Lingual/palatal residue;Piecemeal swallowing Oral Phase - Comment Oral Phase - Comment: c/o lingual pain. Ulcer like appearance to lingual tip   Pharyngeal Phase Pharyngeal Phase Pharyngeal Phase: Impaired Pharyngeal - Thin Pharyngeal - Thin Teaspoon: Delayed swallow initiation;Premature spillage to valleculae;Penetration/Aspiration before swallow;Pharyngeal residue - valleculae;Reduced epiglottic inversion (intermittent delay in initiation) Penetration/Aspiration details (thin teaspoon): Material enters airway, passes BELOW cords without attempt by patient to eject out (silent aspiration)  Cervical Esophageal Phase    GO    Cervical Esophageal Phase Cervical Esophageal Phase: Impaired Cervical Esophageal Phase - Thin Thin Teaspoon: Reduced cricopharyngeal relaxation        Ferdinand Lango MA, CCC-SLP [phone]  Tracia Lacomb Meryl 06/15/2013, 2:40 PM

## 2013-06-15 NOTE — Progress Notes (Signed)
Patient ID: Kimberly May, female   DOB: 1991-02-26, 23 y.o.   MRN: [id number]         [facility]    Date of Admission:  06/09/2013   Total days of antibiotics 22        Day 6 ceftriaxone        Post op Day 5         Active Problems:   Retropharyngeal abscess   Acute respiratory failure   Septic shock   ARF (acute renal failure)   . antiseptic oral rinse  15 mL Mouth Rinse QID  . cefTRIAXone (ROCEPHIN)  IV  2 g Intravenous Q24H  . chlorhexidine  15 mL Mouth Rinse BID  . feeding supplement (PRO-STAT SUGAR FREE 64)  30 mL Per Tube QID  . feeding supplement (VITAL HIGH PROTEIN)  1,000 mL Per Tube Q24H  . furosemide  20 mg Intravenous Q12H  . heparin subcutaneous  5,000 Units Subcutaneous Q8H  . insulin aspart  0-15 Units Subcutaneous Q4H  . pantoprazole sodium  40 mg Per Tube Daily   Objective: Temp:  [98.6 F (37 C)-99.5 F (37.5 C)] 98.9 F (37.2 C) (02/05 0753) Pulse Rate:  [71-105] 79 (02/05 0722) Resp:  [15-28] 22 (02/05 0722) BP: (95-163)/(44-111) 100/56 mmHg (02/05 0722) SpO2:  [93 %-100 %] 100 % (02/05 0722) FiO2 (%):  [40 %] 40 % (02/05 0722) Weight:  [110.6 kg (243 lb 13.3 oz)] 110.6 kg (243 lb 13.3 oz) (02/05 0600)  General: Alert and comfortable Lungs: New trach  Lab Results Lab Results  Component Value Date   WBC 9.5 06/15/2013   HGB 8.3* 06/15/2013   HCT 25.8* 06/15/2013   MCV 86.0 06/15/2013   PLT 181 06/15/2013    Lab Results  Component Value Date   CREATININE 0.57 06/15/2013   BUN 12 06/15/2013   NA 143 06/15/2013   K 3.9 06/15/2013   CL 104 06/15/2013   CO2 28 06/15/2013    Lab Results  Component Value Date   ALT 10 06/10/2013   AST 7 06/10/2013   ALKPHOS 56 06/10/2013   BILITOT 0.6 06/10/2013      Microbiology: Recent Results (from the past 240 hour(s))  CULTURE, ROUTINE-ABSCESS     Status: None   Collection Time    06/10/13 12:41 AM      Result Value Range Status   Specimen Description NECK LEFT ABSCESS   Final   Special  Requests     Final   Value: LEFT RETROPHARYNGEAL PATIENT ON FOLLOWING ZITHROMAX   Gram Stain     Final   Value: ABUNDANT WBC PRESENT, PREDOMINANTLY PMN     FEW SQUAMOUS EPITHELIAL CELLS PRESENT     MODERATE GRAM POSITIVE COCCI IN PAIRS     IN CLUSTERS IN CHAINS     Performed at [facility]   Culture     Final   Value: MODERATE GROUP A STREP (S.PYOGENES) ISOLATED     Performed at [facility]   Report Status 06/12/2013 FINAL   Final  ANAEROBIC CULTURE     Status: None   Collection Time    06/10/13 12:41 AM      Result Value Range Status   Specimen Description ABSCESS LEFT NECK   Final   Special Requests     Final   Value: LEFT RETROPHARYNGEAL PATIENT ON FOLLOWING ZITHROMAX   Gram Stain     Final   Value: MODERATE WBC PRESENT, PREDOMINANTLY  PMN     RARE SQUAMOUS EPITHELIAL CELLS PRESENT     MODERATE GRAM POSITIVE COCCI IN PAIRS     IN CLUSTERS     [facility] at [facility]   Culture     Final   Value: NO ANAEROBES ISOLATED; CULTURE IN PROGRESS FOR 5 DAYS     [facility]formed at [facility]   Report Status PENDING   Incomplete  MRSA PCR SCREENING     Status: None   Collection Time    06/10/13  1:46 AM      Result Value Range Status   MRSA by PCR NEGATIVE  NEGATIVE Final   Comment:            The GeneXpert MRSA Assay (FDA     approved for NASAL specimens     only), is one component of a     comprehensive MRSA colonization     surveillance program. It is not     intended to diagnose MRSA     infection nor to guide or     monitor treatment for     MRSA infections.  URINE CULTURE     Status: None   Collection Time    06/10/13  7:05 PM      Result Value Range Status   Specimen Description URINE, CATHETERIZED   Final   Special Requests NONE   Final   Culture  Setup Time     Final   Value: 06/11/2013 00:26     Performed at [facility]   Colony Count     Final   Value: NO GROWTH     Performed at [facility]   Culture     Final     Value: NO GROWTH     Performed at [facility]   Report Status 06/12/2013 FINAL   Final  CULTURE, RESPIRATORY (NON-EXPECTORATED)     Status: None   Collection Time    06/10/13  7:30 PM      Result Value Range Status   Specimen Description ENDOTRACHEAL ASPIRATE   Final   Special Requests NONE   Final   Gram Stain     Final   Value: MODERATE WBC PRESENT, PREDOMINANTLY PMN     RARE SQUAMOUS EPITHELIAL CELLS PRESENT     NO ORGANISMS SEEN     Performed at [facility]   Culture     Final   Value: Non-Pathogenic Oropharyngeal-type Flora Isolated.     Performed at [facility]   Report Status 06/13/2013 FINAL   Final  CULTURE, BLOOD (ROUTINE X 2)     Status: None   Collection Time    06/10/13  8:00 PM      Result Value Range Status   Specimen Description BLOOD LEFT ANTECUBITAL   Final   Special Requests BOTTLES DRAWN AEROBIC AND ANAEROBIC 10CC   Final   Culture  Setup Time     Final   Value: 06/11/2013 00:17     Performed at [facility]   Culture     Final   Value:        BLOOD CULTURE RECEIVED NO GROWTH TO DATE CULTURE WILL BE HELD FOR 5 DAYS BEFORE ISSUING A FINAL NEGATIVE REPORT     Performed at [facility]   Report Status PENDING   Incomplete  CULTURE, BLOOD (ROUTINE X 2)     Status: None   Collection Time    06/10/13  8:09  PM      Result Value Range Status   Specimen Description BLOOD LEFT HAND   Final   Special Requests BOTTLES DRAWN AEROBIC AND ANAEROBIC 10CC   Final   Culture  Setup Time     Final   Value: 06/11/2013 00:17     Performed at [facility]   Culture     Final   Value:        BLOOD CULTURE RECEIVED NO GROWTH TO DATE CULTURE WILL BE HELD FOR 5 DAYS BEFORE ISSUING A FINAL NEGATIVE REPORT     Performed at [facility]   Report Status PENDING   Incomplete    Studies/Results: Dg Chest 1 View  06/15/2013   CLINICAL DATA:  Status post tracheostomy placement.  EXAM: CHEST - 1 VIEW  COMPARISON:  06/13/2013   FINDINGS: Tracheostomy tube tip projects over the upper thoracic tracheal air shadow, well positioned.  Nasogastric tube has been removed.  New right-sided PICC has its tip in the lower superior vena cava.  Persistent lung base opacity consistent with a combination of pleural fluid likely with atelectasis. No overt edema.  IMPRESSION: 1. Tracheostomy tube is well positioned. 2. Lung opacity is similar to the prior study most likely combination of pleural fluid with atelectasis.   Electronically Signed   By: Amie Portland M.D.   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 Yesterday's repeat surgery revealed "no frank pus". I would recommend treating with ceftriaxone for at least 14 days following surgery on January 31 and her last positive culture.  Plan: 1. Continue ceftriaxone  Cliffton Asters, MD [facility] [phone] pager   [phone] cell 06/15/2013, 10:17 AM

## 2013-06-16 DIAGNOSIS — I369 Nonrheumatic tricuspid valve disorder, unspecified: Secondary | ICD-10-CM

## 2013-06-16 LAB — CBC WITH DIFFERENTIAL/PLATELET
Basophils Absolute: 0 10*3/uL (ref 0.0–0.1)
Basophils Relative: 0 % (ref 0–1)
Eosinophils Absolute: 0.1 10*3/uL (ref 0.0–0.7)
Eosinophils Relative: 1 % (ref 0–5)
HEMATOCRIT: 24.6 % — AB (ref 36.0–46.0)
Hemoglobin: 8 g/dL — ABNORMAL LOW (ref 12.0–15.0)
LYMPHS ABS: 1.6 10*3/uL (ref 0.7–4.0)
Lymphocytes Relative: 16 % (ref 12–46)
MCH: 27.6 pg (ref 26.0–34.0)
MCHC: 32.5 g/dL (ref 30.0–36.0)
MCV: 84.8 fL (ref 78.0–100.0)
MONO ABS: 0.9 10*3/uL (ref 0.1–1.0)
Monocytes Relative: 9 % (ref 3–12)
NEUTROS ABS: 7.3 10*3/uL (ref 1.7–7.7)
NEUTROS PCT: 74 % (ref 43–77)
Platelets: 215 10*3/uL (ref 150–400)
RBC: 2.9 MIL/uL — AB (ref 3.87–5.11)
RDW: 13 % (ref 11.5–15.5)
WBC: 9.9 10*3/uL (ref 4.0–10.5)

## 2013-06-16 LAB — GLUCOSE, CAPILLARY
GLUCOSE-CAPILLARY: 109 mg/dL — AB (ref 70–99)
GLUCOSE-CAPILLARY: 95 mg/dL (ref 70–99)
GLUCOSE-CAPILLARY: 96 mg/dL (ref 70–99)
Glucose-Capillary: 100 mg/dL — ABNORMAL HIGH (ref 70–99)
Glucose-Capillary: 116 mg/dL — ABNORMAL HIGH (ref 70–99)
Glucose-Capillary: 98 mg/dL (ref 70–99)

## 2013-06-16 LAB — BASIC METABOLIC PANEL
BUN: 12 mg/dL (ref 6–23)
CHLORIDE: 102 meq/L (ref 96–112)
CO2: 28 mEq/L (ref 19–32)
CREATININE: 0.55 mg/dL (ref 0.50–1.10)
Calcium: 8.1 mg/dL — ABNORMAL LOW (ref 8.4–10.5)
GFR calc Af Amer: >90 mL/min (ref >90)
GFR calc non Af Amer: >90 mL/min (ref >90)
Glucose, Bld: 98 mg/dL (ref 70–99)
POTASSIUM: 3.6 meq/L — AB (ref 3.7–5.3)
Sodium: 143 mEq/L (ref 137–147)

## 2013-06-16 MED ORDER — ENOXAPARIN SODIUM 40 MG/0.4ML ~~LOC~~ SOLN
40.0000 mg | SUBCUTANEOUS | Status: DC
  Administered 2013-06-16 – 2013-06-26 (×11): 40 mg via SUBCUTANEOUS
  Filled 2013-06-16 (×13): qty 0.4

## 2013-06-16 MED ORDER — BIOTENE DRY MOUTH MT LIQD
15.0000 mL | Freq: Two times a day (BID) | OROMUCOSAL | Status: DC
  Administered 2013-06-17 – 2013-06-24 (×14): 15 mL via OROMUCOSAL

## 2013-06-16 MED ORDER — CHLORHEXIDINE GLUCONATE 0.12 % MT SOLN
15.0000 mL | Freq: Two times a day (BID) | OROMUCOSAL | Status: DC
  Administered 2013-06-16 – 2013-06-24 (×16): 15 mL via OROMUCOSAL
  Filled 2013-06-16 (×17): qty 15

## 2013-06-16 MED ORDER — FREE WATER: 200.0000 mL | Freq: Three times a day (TID) | Status: DC

## 2013-06-16 NOTE — Progress Notes (Signed)
Speech Language Pathology Treatment: Dysphagia  Patient Details Name: Kimberly May MRN: [id number] DOB: 1990/05/20 Today's Date: 06/16/2013 Time: 1020-1040 SLP Time Calculation (min): 20 min  Assessment / Plan / Recommendation Clinical Impression  SLP treatment focused on education and assessment of possible ice prn for pts comfort and oral hydration. SLP provided 3 ice chips over a 15 minute period following oral care with RN. Unfortunately this resulted in suspected silent aspiration with late sensation of tracheal aspirate. Though quantity of aspirate was likely insignificant it did lead to uncomfortable coughing episode with tachycardia. Pt is not recommended to consume ice chips due to this detrimental response. Will f/u on Monday to determine if pts edema has decreased for progress toward PO intake   HPI HPI: 23 years old female with no significant PMH. Underwent I&D of retropharyngeal abscess and possible early mediastinitis 06/09/13. 2/2 s/p interval incision and drainage of suppurative left lateral retropharyngeal lymph node. Persistent edema and phlegmom throughout the retropharyngeal space without evidence of new abscess. 2/4 re-exploration of would with trach placement. Discussed patient with Dr. Lazarus SalinesWolicki who reported non-patent upper airway with trach cuff deflation. Wishes to hold PMSV evaluation until 2/6 however wants to proceed with bedside swallow eval to determine need for non-oral means of nutrition.    Pertinent Vitals NA  SLP Plan       Recommendations Diet recommendations: NPO Liquids provided via: Teaspoon Medication Administration: Via alternative means      Patient may use [name] Speech Valve: with SLP only MD: Please consider changing trach tube to : Smaller size;Cuffless       Oral Care Recommendations: Oral care Q4 per protocol Follow up Recommendations: Outpatient SLP    GO    [facility] LLCBonnie Laine Fonner, MA CCC-SLP [phone]  Claudine MoutonDeBlois, Shaina Gullatt Caroline 06/16/2013,  11:05 AM

## 2013-06-16 NOTE — Evaluation (Addendum)
[name] Speaking Valve Evaluation Patient Details  Name: Kimberly May MRN: [id number] Date of Birth: May 29, 1990  Today's Date: 06/16/2013 Time: 1000-1020 SLP Time Calculation (min): 20 min  Past Medical History:  Past Medical History  Diagnosis Date  . Tuberculosis 15 YRS AGO     FATHER HAD TB , PT WAS MED [place]   Past Surgical History:  Past Surgical History  Procedure Laterality Date  . Tympanostomy tube placement  DONE TWICE     BILAT    [facility]  . Cesarean section  2009   . Tonsillectomy  04/10/2011    Procedure: TONSILLECTOMY;  Surgeon: Antony Contras;  Location: [facility] OR;  Service: ENT;  Laterality: Bilateral;  . Incision and drainage abscess N/A 06/04/2013    Procedure: INCISION AND DRAINAGE PHARYNGEAL ABSCESS;  Surgeon: Melvenia Beam, MD;  Location: [facility] OR;  Service: ENT;  Laterality: N/A;  . Adenoidectomy    . Incision and drainage of peritonsillar abcess Left 06/09/2013    Procedure: INCISION AND DRAINAGE OF left retropharyneal abscess;  Surgeon: Flo Shanks, MD;  Location: [facility] OR;  Service: ENT;  Laterality: Left;   HPI:  23 years old female with no significant PMH. Underwent I&D of retropharyngeal abscess and possible early mediastinitis 06/09/13. 2/2 s/p interval incision and drainage of suppurative left lateral retropharyngeal lymph node. Persistent edema and phlegmom throughout the retropharyngeal space without evidence of new abscess. 2/4 re-exploration of would with trach placement. Discussed patient with Dr. Lazarus Salines who reported non-patent upper airway with trach cuff deflation. Wishes to hold PMSV evaluation until 2/6 however wants to proceed with bedside swallow eval to determine need for non-oral means of nutrition.    Assessment / Plan / Recommendation Clinical Impression  Pt is not able to tolerate PMSV. There is obviously no redirection of air to the upper airway due to edema. Pt provided some education regarding purpose of trach and anatomy of airway to aid  in her understanding of the situation. Will continue attempts, though expect more time will be needed before any use of PMSV is possible. Requested family bring a whiteboard for pt to more easily express wants/needs.     Aspiration Risk       Diet Recommendation          Other  Recommendations     Follow Up Recommendations  Outpatient SLP    Frequency and Duration min 3x week      Pertinent Vitals/Pain NA    SLP Swallow Goals     Swallow Study Prior Functional Status  Type of Home: House Available Help at Discharge: Family;Available 24 hours/day    General HPI: 23 years old female with no significant PMH. Underwent I&D of retropharyngeal abscess and possible early mediastinitis 06/09/13. 2/2 s/p interval incision and drainage of suppurative left lateral retropharyngeal lymph node. Persistent edema and phlegmom throughout the retropharyngeal space without evidence of new abscess. 2/4 re-exploration of would with trach placement. Discussed patient with Dr. Lazarus Salines who reported non-patent upper airway with trach cuff deflation. Wishes to hold PMSV evaluation until 2/6 however wants to proceed with bedside swallow eval to determine need for non-oral means of nutrition.     Oral/Motor/Sensory Function     Ice Chips     Thin Liquid      Nectar Thick     Honey Thick     Puree     Solid   GO           Cordero Surette, MA CCC-SLP  [phone]   Brookes Craine, Kimberly May 06/16/2013,10:59 AM

## 2013-06-16 NOTE — Progress Notes (Signed)
Rehab Admissions Coordinator Note:  Patient was screened by Trish MageLogue, Mikyla Schachter M for appropriateness for an Inpatient Acute Rehab Consult.  At this time, we are recommending Inpatient Rehab consult.  Lelon FrohlichLogue, Jamarcus Laduke M 06/16/2013, 11:19 AM  I can be reached at [phone].

## 2013-06-16 NOTE — Progress Notes (Signed)
Patient ID: Kimberly T Cork, female   DOB: [birth year], 23 y.o.   MRN: [id number]         [facility]    Date of Admission:  06/09/2013   Total days of antibiotics 23        Day 7 ceftriaxone        Post op Day 6         Active Problems:   Retropharyngeal abscess   Acute respiratory failure   Septic shock   ARF (acute renal failure)   . cefTRIAXone (ROCEPHIN)  IV  2 g Intravenous Q24H  . enoxaparin (LOVENOX) injection  40 mg Subcutaneous Q24H  . feeding supplement (PRO-STAT SUGAR FREE 64)  30 mL Per Tube QID  . feeding supplement (VITAL HIGH PROTEIN)  1,000 mL Per Tube Q24H  . free water  200 mL Per Tube Q8H  . magic mouthwash  5 mL Oral QID   Objective: Temp:  [98.3 F (36.8 C)-99.9 F (37.7 C)] 98.4 F (36.9 C) (02/06 0753) Pulse Rate:  [76-131] 76 (02/06 0800) Resp:  [12-32] 21 (02/06 0800) BP: (106-160)/(56-91) 110/69 mmHg (02/06 0753) SpO2:  [86 %-98 %] 91 % (02/06 0800) FiO2 (%):  [40 %-60 %] 40 % (02/06 0800) Weight:  [105.9 kg (233 lb 7.5 oz)-109.8 kg (242 lb 1 oz)] 105.9 kg (233 lb 7.5 oz) (02/06 0422)  General: Alert and comfortable sitting up in a chair  Lab Results Lab Results  Component Value Date   WBC 9.9 06/16/2013   HGB 8.0* 06/16/2013   HCT 24.6* 06/16/2013   MCV 84.8 06/16/2013   PLT 215 06/16/2013    Lab Results  Component Value Date   CREATININE 0.55 06/16/2013   BUN 12 06/16/2013   NA 143 06/16/2013   K 3.6* 06/16/2013   CL 102 06/16/2013   CO2 28 06/16/2013    Lab Results  Component Value Date   ALT 10 06/10/2013   AST 7 06/10/2013   ALKPHOS 56 06/10/2013   BILITOT 0.6 06/10/2013      Microbiology: Recent Results (from the past 240 hour(s))  CULTURE, ROUTINE-ABSCESS     Status: None   Collection Time    06/10/13 12:41 AM      Result Value Range Status   Specimen Description NECK LEFT ABSCESS   Final   Special Requests     Final   Value: LEFT RETROPHARYNGEAL PATIENT ON FOLLOWING ZITHROMAX   Gram Stain     Final   Value:  ABUNDANT WBC PRESENT, PREDOMINANTLY PMN     FEW SQUAMOUS EPITHELIAL CELLS PRESENT     MODERATE GRAM POSITIVE COCCI IN PAIRS     IN CLUSTERS IN CHAINS     Performed at [facility]   Culture     Final   Value: MODERATE GROUP A STREP (S.PYOGENES) ISOLATED     Performed at [facility]   Report Status 06/12/2013 FINAL   Final  ANAEROBIC CULTURE     Status: None   Collection Time    06/10/13 12:41 AM      Result Value Range Status   Specimen Description ABSCESS LEFT NECK   Final   Special Requests     Final   Value: LEFT RETROPHARYNGEAL PATIENT ON FOLLOWING ZITHROMAX   Gram Stain     Final   Value: MODERATE WBC PRESENT, PREDOMINANTLY PMN     RARE SQUAMOUS EPITHELIAL CELLS PRESENT     MODERATE GRAM POSITIVE COCCI IN  PAIRS     IN CLUSTERS     Performed at [facility]   Culture     Final   Value: NO ANAEROBES ISOLATED     Performed at [facility]   Report Status 06/15/2013 FINAL   Final  MRSA PCR SCREENING     Status: None   Collection Time    06/10/13  1:46 AM      Result Value Range Status   MRSA by PCR NEGATIVE  NEGATIVE Final   Comment:            The GeneXpert MRSA Assay (FDA     approved for NASAL specimens     only), is one component of a     comprehensive MRSA colonization     surveillance program. It is not     intended to diagnose MRSA     infection nor to guide or     monitor treatment for     MRSA infections.  URINE CULTURE     Status: None   Collection Time    06/10/13  7:05 PM      Result Value Range Status   Specimen Description URINE, CATHETERIZED   Final   Special Requests NONE   Final   Culture  Setup Time     Final   Value: 06/11/2013 00:26     Performed at [facility]   Colony Count     Final   Value: NO GROWTH     Performed at [facility]   Culture     Final   Value: NO GROWTH     Performed at [facility]   Report Status 06/12/2013 FINAL   Final  CULTURE, RESPIRATORY (NON-EXPECTORATED)      Status: None   Collection Time    06/10/13  7:30 PM      Result Value Range Status   Specimen Description ENDOTRACHEAL ASPIRATE   Final   Special Requests NONE   Final   Gram Stain     Final   Value: MODERATE WBC PRESENT, PREDOMINANTLY PMN     RARE SQUAMOUS EPITHELIAL CELLS PRESENT     NO ORGANISMS SEEN     Performed at [facility]   Culture     Final   Value: Non-Pathogenic Oropharyngeal-type Flora Isolated.     Performed at [facility]   Report Status 06/13/2013 FINAL   Final  CULTURE, BLOOD (ROUTINE X 2)     Status: None   Collection Time    06/10/13  8:00 PM      Result Value Range Status   Specimen Description BLOOD LEFT ANTECUBITAL   Final   Special Requests BOTTLES DRAWN AEROBIC AND ANAEROBIC 10CC   Final   Culture  Setup Time     Final   Value: 06/11/2013 00:17     Performed at [facility]   Culture     Final   Value:        BLOOD CULTURE RECEIVED NO GROWTH TO DATE CULTURE WILL BE HELD FOR 5 DAYS BEFORE ISSUING A FINAL NEGATIVE REPORT     Performed at [facility]   Report Status PENDING   Incomplete  CULTURE, BLOOD (ROUTINE X 2)     Status: None   Collection Time    06/10/13  8:09 PM      Result Value Range Status   Specimen Description BLOOD LEFT HAND   Final   Special Requests BOTTLES  DRAWN AEROBIC AND ANAEROBIC 10CC   Final   Culture  Setup Time     Final   Value: 06/11/2013 00:17     Performed at [facility]   Culture     Final   Value:        BLOOD CULTURE RECEIVED NO GROWTH TO DATE CULTURE WILL BE HELD FOR 5 DAYS BEFORE ISSUING A FINAL NEGATIVE REPORT     Performed at [facility]   Report Status PENDING   Incomplete    Studies/Results: Dg Chest 1 View  06/15/2013   CLINICAL DATA:  Status post tracheostomy placement.  EXAM: CHEST - 1 VIEW  COMPARISON:  06/13/2013  FINDINGS: Tracheostomy tube tip projects over the upper thoracic tracheal air shadow, well positioned.  Nasogastric tube has been removed.   New right-sided PICC has its tip in the lower superior vena cava.  Persistent lung base opacity consistent with a combination of pleural fluid likely with atelectasis. No overt edema.  IMPRESSION: 1. Tracheostomy tube is well positioned. 2. Lung opacity is similar to the prior study most likely combination of pleural fluid with atelectasis.   Electronically Signed   By: Amie Portland M.D.   On: 06/15/2013 08:49   Dg Swallowing Func-speech Pathology  06/15/2013   [facility] McCoy, CCC-SLP     06/15/2013  2:40 PM Objective Swallowing Evaluation: Modified Barium Swallowing Study   Patient Details  Name: Kimberly May MRN: [id number] Date of Birth: 04-13-1991  Today's Date: 06/15/2013 Time: 6295-2841 SLP Time Calculation (min): 20 min  Past Medical History:  Past Medical History  Diagnosis Date  . Tuberculosis 15 YRS AGO     FATHER HAD TB , PT WAS MED TX   Past Surgical History:  Past Surgical History  Procedure Laterality Date  . Tympanostomy tube placement  DONE TWICE     BILAT    [facility])  . Cesarean section  2009   . Tonsillectomy  04/10/2011    Procedure: TONSILLECTOMY;  Surgeon: Antony Contras;  Location:  [facility] OR;  Service: ENT;  Laterality: Bilateral;  . Incision and drainage abscess N/A 06/04/2013    Procedure: INCISION AND DRAINAGE PHARYNGEAL ABSCESS;  Surgeon:  Melvenia Beam, MD;  Location: [facility] OR;  Service: ENT;  Laterality:  N/A;  . Adenoidectomy    . Incision and drainage of peritonsillar abcess Left 06/09/2013    Procedure: INCISION AND DRAINAGE OF left retropharyneal  abscess;  Surgeon: Flo Shanks, MD;  Location: [facility] OR;  Service:  ENT;  Laterality: Left;   HPI:  23 years old female with no significant PMH. Underwent I&D of  retropharyngeal abscess and possible early mediastinitis 06/09/13.  2/2 s/p interval incision and drainage of suppurative left  lateral retropharyngeal lymph node. Persistent edema and phlegmom  throughout the retropharyngeal space without evidence of new  abscess. 2/4 re-exploration  of would with trach placement.  Discussed patient with Dr. Lazarus Salines who reported non-patent upper  airway with trach cuff deflation. Wishes to hold PMSV evaluation  until 2/6 however wants to proceed with bedside swallow eval to  determine need for non-oral means of nutrition.      Assessment / Plan / Recommendation Clinical Impression  Dysphagia Diagnosis: Severe pharyngeal phase dysphagia;Severe  cervical esophageal phase dysphagia Clinical impression: Patient presents with mild oral (due to c/o  lingual pain/edema) and a severe pharyngo-esophageal dysphagia  characterized by delayed oral transit and severe retropharyngeal  and posterior pharyngeal wall edema resulting in decreased  epiglottic deflection and UES relaxation and leading to silent  aspiration of thin liquids. Question nerve involvement given  silent nature of aspirates. SLP provided cueing for effortful  swallow and hard cough, neither of which were successful to  prevent, decrease, or clear aspirates. RN deep suctioned  following evaluation. Provided education regarding results and  plan. Prognosis for improvement good with decreased edema. SLP  will continue to f/u.     Treatment Recommendation  Therapy as outlined in treatment plan below    Diet Recommendation NPO;Alternative means - temporary   Medication Administration: Via alternative means    Other  Recommendations Recommended Consults: MBS (3-5 days) Oral Care Recommendations: Oral care Q4 per protocol   Follow Up Recommendations   (TBD)    Frequency and Duration min 3x week  2 weeks        General HPI: 23 years old female with no significant PMH.  Underwent I&D of retropharyngeal abscess and possible early  mediastinitis 06/09/13. 2/2 s/p interval incision and drainage of  suppurative left lateral retropharyngeal lymph node. Persistent  edema and phlegmom throughout the retropharyngeal space without  evidence of new abscess. 2/4 re-exploration of would with trach  placement. Discussed patient  with Dr. Lazarus SalinesWolicki who reported  non-patent upper airway with trach cuff deflation. Wishes to hold  PMSV evaluation until 2/6 however wants to proceed with bedside  swallow eval to determine need for non-oral means of nutrition.  Type of Study: Modified Barium Swallowing Study Reason for Referral: Objectively evaluate swallowing function Previous Swallow Assessment: none Diet Prior to this Study: NPO Temperature Spikes Noted: No Respiratory Status: Trach collar Trach Size and Type: #6;Cuff History of Recent Intubation: Yes Length of Intubations (days): 4 days Date extubated: 06/13/13 Behavior/Cognition: Alert;Cooperative;Pleasant mood Oral Cavity - Dentition: Adequate natural dentition Oral Motor / Sensory Function: Impaired - see Bedside swallow  eval Self-Feeding Abilities: Able to feed self Patient Positioning: Upright in chair Baseline Vocal Quality: Aphonic (non-patient upper airway, no  pmsv in place) Volitional Cough: Strong Volitional Swallow: Able to elicit (mild facial grimacing) Anatomy:  (severe retropharyngeal and posterior pharyngeal wall  edema) Pharyngeal Secretions: Not observed secondary MBS    Reason for Referral Objectively evaluate swallowing function   Oral Phase Oral Preparation/Oral Phase Oral Phase: Impaired Oral - Thin Oral - Thin Teaspoon: Left anterior bolus loss;Right anterior  bolus loss;Reduced posterior propulsion;Delayed oral  transit;Lingual/palatal residue;Piecemeal swallowing Oral Phase - Comment Oral Phase - Comment: c/o lingual pain. Ulcer like appearance to  lingual tip   Pharyngeal Phase Pharyngeal Phase Pharyngeal Phase: Impaired Pharyngeal - Thin Pharyngeal - Thin Teaspoon: Delayed swallow initiation;Premature  spillage to valleculae;Penetration/Aspiration before  swallow;Pharyngeal residue - valleculae;Reduced epiglottic  inversion (intermittent delay in initiation) Penetration/Aspiration details (thin teaspoon): Material enters  airway, passes BELOW cords without attempt by  patient to eject  out (silent aspiration)  Cervical Esophageal Phase    GO    Cervical Esophageal Phase Cervical Esophageal Phase: Impaired Cervical Esophageal Phase - Thin Thin Teaspoon: Reduced cricopharyngeal relaxation        Leah McCoy MA, CCC-SLP [phone]  McCoy Leah Meryl 06/15/2013, 2:40 PM     Assessment: She is improving slowly on therapy for 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 I recommend treating with ceftriaxone for at least 14 days following surgery on January 31 and her last positive culture.  Plan: 1. Continue ceftriaxone 2. Please call Dr. Enedina FinnerJeff Hatcher [phone]) for any infectious disease questions this weekend  Cliffton AstersJohn Alyse Kathan, MD  [facility] [phone] pager   [phone] cell 06/16/2013, 11:17 AM

## 2013-06-16 NOTE — Progress Notes (Signed)
NUTRITION FOLLOW UP  Intervention:    Recommend place NGT in IR for TF.  For TF recommend initiate Vital AF 1.2 at 25 ml/h, increase by 10 ml every 4 hours to goal rate of 65 ml/h to provide 1872 kcals, 117 gm protein, 1265 ml free water daily.  Nutrition Dx:   Inadequate oral intake related to inability to eat as evidenced by NPO status. Ongoing.  New Goal:   Intake to meet >90% of estimated nutrition needs. Unmet.  Monitor:   TF tolerance/adequacy, swallowing function and ability to advance diet, weight trend, labs, respiratory status.  Assessment:   Pt admitted on 1/30 s/p emergency adenoidectomy with worsening pain and inability to eat or drink. Pt found to have retropharyngeal abscess with extension into the deep neck. S/P I&D on 1/31.  S/P tracheostomy on 2/4. Currently on trach collar. TF has been off since 2/3 for trach placement. Patient currently without enteral access. Per discussion with RN, IR has recommended that they place feeding tube if needed. RN to discuss feeding tube placement with physician.  SLP following for swallowing function. S/P MBS yesterday, SLP recommends NPO with temporary alternative means for nutrition.  Height: Ht Readings from Last 1 Encounters:  06/15/13 5' (1.524 m)    Weight Status:   Wt Readings from Last 1 Encounters:  06/16/13 233 lb 7.5 oz (105.9 kg)  06/09/13  209 lb (94.802 kg)   Re-estimated needs:  Kcal: 4270-6237  Protein: 114 gm Fluid: 1.9-2.1 L  Skin: surgical incisions to neck and throat  Diet Order: NPO   Intake/Output Summary (Last 24 hours) at 06/16/13 1028 Last data filed at 06/16/13 0755  Gross per 24 hour  Intake    130 ml  Output   2152 ml  Net  -2022 ml    Last BM: 2/5  Labs:   Recent Labs Lab 06/11/13 0500  06/13/13 0400 06/14/13 0900 06/14/13 2145 06/15/13 0800 06/16/13 0300  NA 143  < > 143 142  --  143 143  K 3.9  < > 3.9 3.7  --  3.9 3.6*  CL 109  < > 106 102  --  104 102  CO2 21  < >  27 31  --  28 28  BUN 9  < > 16 19  --  12 12  CREATININE 0.85  < > 0.69 0.64 0.57 0.57 0.55  CALCIUM 7.3*  < > 7.8* 8.0*  --  7.9* 8.1*  MG 2.2  --  2.2  --   --  2.2  --   PHOS 3.9  --  4.0  --   --  3.4  --   GLUCOSE 94  < > 112* 97  --  102* 98  < > = values in this interval not displayed.  CBG (last 3)   Recent Labs  06/16/13 0010 06/16/13 0422 06/16/13 0757  GLUCAP 95 100* 96    Scheduled Meds: . antiseptic oral rinse  15 mL Mouth Rinse QID  . cefTRIAXone (ROCEPHIN)  IV  2 g Intravenous Q24H  . chlorhexidine  15 mL Mouth Rinse BID  . feeding supplement (PRO-STAT SUGAR FREE 64)  30 mL Per Tube QID  . feeding supplement (VITAL HIGH PROTEIN)  1,000 mL Per Tube Q24H  . furosemide  20 mg Intravenous Q12H  . heparin subcutaneous  5,000 Units Subcutaneous Q8H  . insulin aspart  0-15 Units Subcutaneous Q4H  . magic mouthwash  5 mL Oral QID  . pantoprazole (  PROTONIX) IV  40 mg Intravenous Q24H    Continuous Infusions: . sodium chloride 20 mL/hr at 06/12/13 1524    Joaquin CourtsKimberly Harris, RD, LDN, CNSC Pager [phone] After Hours Pager [phone]

## 2013-06-16 NOTE — Progress Notes (Signed)
Subjective: Breathing well, sewlling and pain persisting.  Objective: Vital signs in last 24 hours: Temp:  [98.3 F (36.8 C)-99.9 F (37.7 C)] 98.4 F (36.9 C) (02/06 0753) Pulse Rate:  [76-131] 76 (02/06 0800) Resp:  [12-32] 21 (02/06 0800) BP: (106-160)/(56-91) 110/69 mmHg (02/06 0753) SpO2:  [86 %-98 %] 91 % (02/06 0800) FiO2 (%):  [40 %-60 %] 40 % (02/06 0800) Weight:  [233 lb 7.5 oz (105.9 kg)-242 lb 1 oz (109.8 kg)] 233 lb 7.5 oz (105.9 kg) (02/06 0422) Weight change: -1 lb 12.2 oz (-0.8 kg) Last BM Date: 06/15/13  Intake/Output from previous day: 02/05 0701 - 02/06 0700 In: 193 [I.V.:143; IV Piggyback:50] Out: 1152 [Urine:1150; Stool:2] Intake/Output this shift: Total I/O In: -  Out: 1000 [Urine:1000]  PHYSICAL EXAM: Drains in place, draining serous fluid, minimal. Trach stable. Neck and facial swelling without change.  Lab Results:  Recent Labs  06/15/13 0800 06/16/13 0300  WBC 9.5 9.9  HGB 8.3* 8.0*  HCT 25.8* 24.6*  PLT 181 215   BMET  Recent Labs  06/15/13 0800 06/16/13 0300  NA 143 143  K 3.9 3.6*  CL 104 102  CO2 28 28  GLUCOSE 102* 98  BUN 12 12  CREATININE 0.57 0.55  CALCIUM 7.9* 8.1*    Studies/Results: Dg Chest 1 View  06/15/2013   CLINICAL DATA:  Status post tracheostomy placement.  EXAM: CHEST - 1 VIEW  COMPARISON:  06/13/2013  FINDINGS: Tracheostomy tube tip projects over the upper thoracic tracheal air shadow, well positioned.  Nasogastric tube has been removed.  New right-sided PICC has its tip in the lower superior vena cava.  Persistent lung base opacity consistent with a combination of pleural fluid likely with atelectasis. No overt edema.  IMPRESSION: 1. Tracheostomy tube is well positioned. 2. Lung opacity is similar to the prior study most likely combination of pleural fluid with atelectasis.   Electronically Signed   By: Amie Portland M.D.   On: 06/15/2013 08:49   Dg Swallowing Func-speech Pathology  06/15/2013   [facility]  McCoy, CCC-SLP     06/15/2013  2:40 PM Objective Swallowing Evaluation: Modified Barium Swallowing Study   Patient Details  Name: Kimberly May MRN: [id number] Date of Birth: 1990/08/21  Today's Date: 06/15/2013 Time: 4098-1191 SLP Time Calculation (min): 20 min  Past Medical History:  Past Medical History  Diagnosis Date  . Tuberculosis 15 YRS AGO     FATHER HAD TB , PT WAS MED TX   Past Surgical History:  Past Surgical History  Procedure Laterality Date  . Tympanostomy tube placement  DONE TWICE     BILAT    [facility])  . Cesarean section  2009   . Tonsillectomy  04/10/2011    Procedure: TONSILLECTOMY;  Surgeon: Antony Contras;  Location:  [facility] OR;  Service: ENT;  Laterality: Bilateral;  . Incision and drainage abscess N/A 06/04/2013    Procedure: INCISION AND DRAINAGE PHARYNGEAL ABSCESS;  Surgeon:  Melvenia Beam, MD;  Location: [facility] OR;  Service: ENT;  Laterality:  N/A;  . Adenoidectomy    . Incision and drainage of peritonsillar abcess Left 06/09/2013    Procedure: INCISION AND DRAINAGE OF left retropharyneal  abscess;  Surgeon: Flo Shanks, MD;  Location: [facility] OR;  Service:  ENT;  Laterality: Left;   HPI:  23 years old female with no significant PMH. Underwent I&D of  retropharyngeal abscess and possible early mediastinitis 06/09/13.  2/2 s/p interval incision and drainage of suppurative  left  lateral retropharyngeal lymph node. Persistent edema and phlegmom  throughout the retropharyngeal space without evidence of new  abscess. 2/4 re-exploration of would with trach placement.  Discussed patient with Dr. Lazarus Salines who reported non-patent upper  airway with trach cuff deflation. Wishes to hold PMSV evaluation  until 2/6 however wants to proceed with bedside swallow eval to  determine need for non-oral means of nutrition.      Assessment / Plan / Recommendation Clinical Impression  Dysphagia Diagnosis: Severe pharyngeal phase dysphagia;Severe  cervical esophageal phase dysphagia Clinical impression: Patient presents with  mild oral (due to c/o  lingual pain/edema) and a severe pharyngo-esophageal dysphagia  characterized by delayed oral transit and severe retropharyngeal  and posterior pharyngeal wall edema resulting in decreased  epiglottic deflection and UES relaxation and leading to silent  aspiration of thin liquids. Question nerve involvement given  silent nature of aspirates. SLP provided cueing for effortful  swallow and hard cough, neither of which were successful to  prevent, decrease, or clear aspirates. RN deep suctioned  following evaluation. Provided education regarding results and  plan. Prognosis for improvement good with decreased edema. SLP  will continue to f/u.     Treatment Recommendation  Therapy as outlined in treatment plan below    Diet Recommendation NPO;Alternative means - temporary   Medication Administration: Via alternative means    Other  Recommendations Recommended Consults: MBS (3-5 days) Oral Care Recommendations: Oral care Q4 per protocol   Follow Up Recommendations   (TBD)    Frequency and Duration min 3x week  2 weeks        General HPI: 23 years old female with no significant PMH.  Underwent I&D of retropharyngeal abscess and possible early  mediastinitis 06/09/13. 2/2 s/p interval incision and drainage of  suppurative left lateral retropharyngeal lymph node. Persistent  edema and phlegmom throughout the retropharyngeal space without  evidence of new abscess. 2/4 re-exploration of would with trach  placement. Discussed patient with Dr. Lazarus Salines who reported  non-patent upper airway with trach cuff deflation. Wishes to hold  PMSV evaluation until 2/6 however wants to proceed with bedside  swallow eval to determine need for non-oral means of nutrition.  Type of Study: Modified Barium Swallowing Study Reason for Referral: Objectively evaluate swallowing function Previous Swallow Assessment: none Diet Prior to this Study: NPO Temperature Spikes Noted: No Respiratory Status: Trach collar Trach Size and  Type: #6;Cuff History of Recent Intubation: Yes Length of Intubations (days): 4 days Date extubated: 06/13/13 Behavior/Cognition: Alert;Cooperative;Pleasant mood Oral Cavity - Dentition: Adequate natural dentition Oral Motor / Sensory Function: Impaired - see Bedside swallow  eval Self-Feeding Abilities: Able to feed self Patient Positioning: Upright in chair Baseline Vocal Quality: Aphonic (non-patient upper airway, no  pmsv in place) Volitional Cough: Strong Volitional Swallow: Able to elicit (mild facial grimacing) Anatomy:  (severe retropharyngeal and posterior pharyngeal wall  edema) Pharyngeal Secretions: Not observed secondary MBS    Reason for Referral Objectively evaluate swallowing function   Oral Phase Oral Preparation/Oral Phase Oral Phase: Impaired Oral - Thin Oral - Thin Teaspoon: Left anterior bolus loss;Right anterior  bolus loss;Reduced posterior propulsion;Delayed oral  transit;Lingual/palatal residue;Piecemeal swallowing Oral Phase - Comment Oral Phase - Comment: c/o lingual pain. Ulcer like appearance to  lingual tip   Pharyngeal Phase Pharyngeal Phase Pharyngeal Phase: Impaired Pharyngeal - Thin Pharyngeal - Thin Teaspoon: Delayed swallow initiation;Premature  spillage to valleculae;Penetration/Aspiration before  swallow;Pharyngeal residue - valleculae;Reduced epiglottic  inversion (intermittent delay in initiation) Penetration/Aspiration details (  thin teaspoon): Material enters  airway, passes BELOW cords without attempt by patient to eject  out (silent aspiration)  Cervical Esophageal Phase    GO    Cervical Esophageal Phase Cervical Esophageal Phase: Impaired Cervical Esophageal Phase - Thin Thin Teaspoon: Reduced cricopharyngeal relaxation        Ferdinand LangoLeah McCoy MA, CCC-SLP [phone]  McCoy Leah Meryl 06/15/2013, 2:40 PM     Medications: I have reviewed the patient's current medications.  Assessment/Plan: Day 2 S/P I&D and trach. Progressing slowly. Continue care.  LOS: 7 days   Khya Halls,  Camyra Vaeth 06/16/2013, 11:31 AM

## 2013-06-16 NOTE — Progress Notes (Signed)
  Echocardiogram 2D Echocardiogram has been performed.  Kimberly May, Kimberly May 06/16/2013, 12:33 PM

## 2013-06-16 NOTE — Evaluation (Signed)
Physical Therapy Evaluation Patient Details Name: Kimberly May MRN: [id number] DOB: 1990-10-18 Today's Date: 06/16/2013 Time: 7564-3329 PT Time Calculation (min): 27 min  PT Assessment / Plan / Recommendation History of Present Illness  Pt admit with VDRF with trach after emergent I&D for retropharyngeal abscess.    Clinical Impression  Pt admitted withabove. Pt currently with functional limitations due to the deficits listed below (see PT Problem List).  Pt will benefit from skilled PT to increase their independence and safety with mobility to allow discharge to the venue listed below.     PT Assessment  Patient needs continued PT services    Follow Up Recommendations  CIR;Supervision/Assistance - 24 hour                Equipment Recommendations  Other (comment) (TBA)    Recommendations for Other Services OT consult;Rehab consult   Frequency Min 3X/week    Precautions / Restrictions Precautions Precautions: Fall Restrictions Weight Bearing Restrictions: No   Pertinent Vitals/Pain VSS, pain throat and head per pt     Mobility  Bed Mobility Overal bed mobility: Needs Assistance;+2 for physical assistance Bed Mobility: Supine to Sit Supine to sit: Mod assist;+2 for physical assistance General bed mobility comments: Assist to initiate LEs and for elevation of trunk. Transfers Overall transfer level: Needs assistance Equipment used: Rolling walker (2 wheeled) Transfers: Sit to/from [employer] Sit to Stand: Mod assist;+2 physical assistance Stand pivot transfers: Min assist;+2 physical assistance General transfer comment: Pt needed cues for hand placement.  Needed cues for anterior lean for sit to stand as well.  Needed assist to rise.  Pt with BM therefore NT came in and assisted pt with cleaning pt while PT assisted with steadying pt as pt holding onto RW.  Pt able to take pivotal steps to chair with RW.      Exercises General Exercises - Lower  Extremity Ankle Circles/Pumps: AROM;Both;10 reps;Supine Quad Sets: AROM;Both;10 reps;Supine   PT Diagnosis: Generalized weakness  PT Problem List: Decreased activity tolerance;Decreased balance;Decreased mobility;Decreased knowledge of use of DME;Decreased safety awareness;Decreased knowledge of precautions PT Treatment Interventions: DME instruction;Gait training;Functional mobility training;Therapeutic activities;Therapeutic exercise;Balance training;Patient/family education     PT Goals(Current goals can be found in the care plan section) Acute Rehab PT Goals Patient Stated Goal: to go home PT Goal Formulation: With patient Time For Goal Achievement: 06/30/13 Potential to Achieve Goals: Good  Visit Information  Last PT Received On: 06/16/13 Assistance Needed: +2 History of Present Illness: Pt admit with VDRF with trach after emergent I&D for retropharyngeal abscess.         Prior Functioning  Home Living Family/patient expects to be discharged to:: Private residence Living Arrangements: Parent Available Help at Discharge: Family;Available 24 hours/day Type of Home: House Home Access: Ramped entrance;Stairs to enter Entrance Stairs-Number of Steps: 4 Home Layout: One level Home Equipment: None Additional Comments: Pt works as Conservation officer, nature. Prior Function Level of Independence: Independent Communication Communication: Tracheostomy    Cognition  Cognition Arousal/Alertness: Awake/alert Behavior During Therapy: Flat affect Overall Cognitive Status: Difficult to assess Difficult to assess due to: Tracheostomy    Extremity/Trunk Assessment Upper Extremity Assessment Upper Extremity Assessment: Defer to OT evaluation Lower Extremity Assessment Lower Extremity Assessment: Generalized weakness Cervical / Trunk Assessment Cervical / Trunk Assessment: Normal   Balance Balance Overall balance assessment: Needs assistance Sitting-balance support: No upper extremity  supported;Feet supported Sitting balance-Leahy Scale: Good Standing balance support: Bilateral upper extremity supported;During functional activity Standing balance-Leahy Scale: Poor Standing  balance comment: Needs RW for support. General Comments General comments (skin integrity, edema, etc.): Both eyes with blood in them.  Swollen all over.    End of Session PT - End of Session Equipment Utilized During Treatment: Gait belt;Oxygen Activity Tolerance: Patient limited by fatigue Patient left: in chair;with call bell/phone within reach Nurse Communication: Mobility status       INGOLD,Zamyah Wiesman 06/16/2013, 11:05 AM  Audree Camelawn Ingold,PT Acute Rehabilitation [phone] (pager)

## 2013-06-17 ENCOUNTER — Inpatient Hospital Stay (HOSPITAL_COMMUNITY): Payer: BC Managed Care – PPO

## 2013-06-17 LAB — CULTURE, BLOOD (ROUTINE X 2)
CULTURE: NO GROWTH
Culture: NO GROWTH

## 2013-06-17 LAB — CBC WITH DIFFERENTIAL/PLATELET
BASOS PCT: 0 % (ref 0–1)
Basophils Absolute: 0 10*3/uL (ref 0.0–0.1)
EOS ABS: 0.2 10*3/uL (ref 0.0–0.7)
Eosinophils Relative: 2 % (ref 0–5)
HCT: 25.9 % — ABNORMAL LOW (ref 36.0–46.0)
Hemoglobin: 8.2 g/dL — ABNORMAL LOW (ref 12.0–15.0)
Lymphocytes Relative: 20 % (ref 12–46)
Lymphs Abs: 2.1 10*3/uL (ref 0.7–4.0)
MCH: 27 pg (ref 26.0–34.0)
MCHC: 31.7 g/dL (ref 30.0–36.0)
MCV: 85.2 fL (ref 78.0–100.0)
Monocytes Absolute: 0.8 10*3/uL (ref 0.1–1.0)
Monocytes Relative: 8 % (ref 3–12)
NEUTROS PCT: 70 % (ref 43–77)
Neutro Abs: 7.3 10*3/uL (ref 1.7–7.7)
PLATELETS: 239 10*3/uL (ref 150–400)
RBC: 3.04 MIL/uL — ABNORMAL LOW (ref 3.87–5.11)
RDW: 13.5 % (ref 11.5–15.5)
WBC: 10.3 10*3/uL (ref 4.0–10.5)

## 2013-06-17 LAB — GLUCOSE, CAPILLARY
GLUCOSE-CAPILLARY: 98 mg/dL (ref 70–99)
GLUCOSE-CAPILLARY: 105 mg/dL — AB (ref 70–99)
Glucose-Capillary: 89 mg/dL (ref 70–99)
Glucose-Capillary: 99 mg/dL (ref 70–99)
Glucose-Capillary: 106 mg/dL — ABNORMAL HIGH (ref 70–99)
Glucose-Capillary: 85 mg/dL (ref 70–99)

## 2013-06-17 LAB — BASIC METABOLIC PANEL
BUN: 17 mg/dL (ref 6–23)
CO2: 27 mEq/L (ref 19–32)
Calcium: 8.1 mg/dL — ABNORMAL LOW (ref 8.4–10.5)
Chloride: 103 mEq/L (ref 96–112)
Creatinine, Ser: 0.59 mg/dL (ref 0.50–1.10)
GFR calc Af Amer: >90 mL/min (ref >90)
Glucose, Bld: 92 mg/dL (ref 70–99)
POTASSIUM: 3.5 meq/L — AB (ref 3.7–5.3)
SODIUM: 144 meq/L (ref 137–147)

## 2013-06-17 MED ORDER — POTASSIUM CHLORIDE CRYS ER 20 MEQ PO TBCR: 20.0000 meq | EXTENDED_RELEASE_TABLET | Freq: Every day | ORAL | Status: DC

## 2013-06-17 MED ORDER — POTASSIUM CHLORIDE 10 MEQ/100ML IV SOLN
10.0000 meq | INTRAVENOUS | Status: AC
  Administered 2013-06-17 (×2): 10 meq via INTRAVENOUS
  Filled 2013-06-17 (×2): qty 100

## 2013-06-17 MED ORDER — POTASSIUM CHLORIDE 20 MEQ PO PACK: 20.0000 meq | PACK | Freq: Every day | ORAL | Status: DC

## 2013-06-17 NOTE — Progress Notes (Signed)
Subjective: No new complaints.  Objective: Vital signs in last 24 hours: Temp:  [98 F (36.7 C)-99.9 F (37.7 C)] 99.9 F (37.7 C) (02/07 0800) Pulse Rate:  [67-134] 76 (02/07 0800) Resp:  [12-26] 17 (02/07 0800) BP: (115-157)/(64-99) 129/67 mmHg (02/07 0736) SpO2:  [87 %-97 %] 87 % (02/07 0800) FiO2 (%):  [40 %] 40 % (02/07 0736) Weight change:  Last BM Date: 06/15/13  Intake/Output from previous day: 02/06 0701 - 02/07 0700 In: 230 [I.V.:180; IV Piggyback:50] Out: 1875 [Urine:1875] Intake/Output this shift: Total I/O In: 20 [I.V.:20] Out: -   PHYSICAL EXAM: Awake and alert. Able to write questions and answers. Trach clear. Drains advanced about 2 cm. Dressing changed. Dark serous drainage.   Lab Results:  Recent Labs  06/16/13 0300 06/17/13 0525  WBC 9.9 10.3  HGB 8.0* 8.2*  HCT 24.6* 25.9*  PLT 215 239   BMET  Recent Labs  06/16/13 0300 06/17/13 0525  NA 143 144  K 3.6* 3.5*  CL 102 103  CO2 28 27  GLUCOSE 98 92  BUN 12 17  CREATININE 0.55 0.59  CALCIUM 8.1* 8.1*    Studies/Results: Dg Chest Port 1 View  06/17/2013   CLINICAL DATA:  Followup infiltrates  EXAM: PORTABLE CHEST - 1 VIEW  COMPARISON:  06/15/2013  FINDINGS: Cardiac shadow remains enlarged. Vascular congestion is noted. A right-sided PICC line is seen and stable. Tracheostomy tube is stable as well. .  IMPRESSION: Poor inspiratory effort. Increased central vascular congestion is noted   Electronically Signed   By: Alcide CleverMark  Lukens M.D.   On: 06/17/2013 07:38   Dg Swallowing Func-speech Pathology  06/15/2013   [facility] Meryl McCoy, CCC-SLP     06/15/2013  2:40 PM Objective Swallowing Evaluation: Modified Barium Swallowing Study   Patient Details  Name: Kimberly May MRN: [id number] Date of Birth: November 05, 1990  Today's Date: 06/15/2013 Time: [id number] SLP Time Calculation (min): 20 min  Past Medical History:  Past Medical History  Diagnosis Date  . Tuberculosis 15 YRS AGO     FATHER HAD TB , PT WAS MED [place]    Past Surgical History:  Past Surgical History  Procedure Laterality Date  . Tympanostomy tube placement  DONE TWICE     BILAT    [facility]  . Cesarean section  2009   . Tonsillectomy  04/10/2011    Procedure: TONSILLECTOMY;  Surgeon: Antony Contraswight D Bates;  Location:  [facility] OR;  Service: ENT;  Laterality: Bilateral;  . Incision and drainage abscess N/A 06/04/2013    Procedure: INCISION AND DRAINAGE PHARYNGEAL ABSCESS;  Surgeon:  Melvenia BeamMitchell Gore, MD;  [facility];  Service: ENT;  Laterality:  N/A;  . Adenoidectomy    . Incision and drainage of peritonsillar abcess Left 06/09/2013    Procedure: INCISION AND DRAINAGE OF left retropharyneal  abscess;  Surgeon: Flo ShanksKarol Wolicki, MD;  [facility];  Service:  ENT;  Laterality: Left;   HPI:  23 years old female with no significant PMH. Underwent I&D of  retropharyngeal abscess and possible early mediastinitis 06/09/13.  2/2 s/p interval incision and drainage of suppurative left  lateral retropharyngeal lymph node. Persistent edema and phlegmom  throughout the retropharyngeal space without evidence of new  abscess. 2/4 re-exploration of would with trach placement.  Discussed patient with Dr. Lazarus SalinesWolicki who reported non-patent upper  airway with trach cuff deflation. Wishes to hold PMSV evaluation  until 2/6 however wants to proceed with bedside swallow eval to  determine need for  non-oral means of nutrition.      Assessment / Plan / Recommendation Clinical Impression  Dysphagia Diagnosis: Severe pharyngeal phase dysphagia;Severe  cervical esophageal phase dysphagia Clinical impression: Patient presents with mild oral (due to c/o  lingual pain/edema) and a severe pharyngo-esophageal dysphagia  characterized by delayed oral transit and severe retropharyngeal  and posterior pharyngeal wall edema resulting in decreased  epiglottic deflection and UES relaxation and leading to silent  aspiration of thin liquids. Question nerve involvement given  silent nature of aspirates. SLP provided  cueing for effortful  swallow and hard cough, neither of which were successful to  prevent, decrease, or clear aspirates. RN deep suctioned  following evaluation. Provided education regarding results and  plan. Prognosis for improvement good with decreased edema. SLP  will continue to f/u.     Treatment Recommendation  Therapy as outlined in treatment plan below    Diet Recommendation NPO;Alternative means - temporary   Medication Administration: Via alternative means    Other  Recommendations Recommended Consults: MBS (3-5 days) Oral Care Recommendations: Oral care Q4 per protocol   Follow Up Recommendations   (TBD)    Frequency and Duration min 3x week  2 weeks        General HPI: 23 years old female with no significant PMH.  Underwent I&D of retropharyngeal abscess and possible early  mediastinitis 06/09/13. 2/2 s/p interval incision and drainage of  suppurative left lateral retropharyngeal lymph node. Persistent  edema and phlegmom throughout the retropharyngeal space without  evidence of new abscess. 2/4 re-exploration of would with trach  placement. Discussed patient with Dr. Lazarus Salines who reported  non-patent upper airway with trach cuff deflation. Wishes to hold  PMSV evaluation until 2/6 however wants to proceed with bedside  swallow eval to determine need for non-oral means of nutrition.  Type of Study: Modified Barium Swallowing Study Reason for Referral: Objectively evaluate swallowing function Previous Swallow Assessment: none Diet Prior to this Study: NPO Temperature Spikes Noted: No Respiratory Status: Trach collar Trach Size and Type: #6;Cuff History of Recent Intubation: Yes Length of Intubations (days): 4 days Date extubated: 06/13/13 Behavior/Cognition: Alert;Cooperative;Pleasant mood Oral Cavity - Dentition: Adequate natural dentition Oral Motor / Sensory Function: Impaired - see Bedside swallow  eval Self-Feeding Abilities: Able to feed self Patient Positioning: Upright in chair Baseline Vocal  Quality: Aphonic (non-patient upper airway, no  pmsv in place) Volitional Cough: Strong Volitional Swallow: Able to elicit (mild facial grimacing) Anatomy:  (severe retropharyngeal and posterior pharyngeal wall  edema) Pharyngeal Secretions: Not observed secondary MBS    Reason for Referral Objectively evaluate swallowing function   Oral Phase Oral Preparation/Oral Phase Oral Phase: Impaired Oral - Thin Oral - Thin Teaspoon: Left anterior bolus loss;Right anterior  bolus loss;Reduced posterior propulsion;Delayed oral  transit;Lingual/palatal residue;Piecemeal swallowing Oral Phase - Comment Oral Phase - Comment: c/o lingual pain. Ulcer like appearance to  lingual tip   Pharyngeal Phase Pharyngeal Phase Pharyngeal Phase: Impaired Pharyngeal - Thin Pharyngeal - Thin Teaspoon: Delayed swallow initiation;Premature  spillage to valleculae;Penetration/Aspiration before  swallow;Pharyngeal residue - valleculae;Reduced epiglottic  inversion (intermittent delay in initiation) Penetration/Aspiration details (thin teaspoon): Material enters  airway, passes BELOW cords without attempt by patient to eject  out (silent aspiration)  Cervical Esophageal Phase    GO    Cervical Esophageal Phase Cervical Esophageal Phase: Impaired Cervical Esophageal Phase - Thin Thin Teaspoon: Reduced cricopharyngeal relaxation        Ferdinand Lango MA, CCC-SLP [phone]  McCoy Leah Meryl 06/15/2013, 2:40  PM     Medications: I have reviewed the patient's current medications.  Assessment/Plan: Stable, slowly improving. Transfer to floor. Encouraged her to move her arms and legs as much as possible while lying in bed and to get up and walk as much as possible. Drains advanced. Continue Abx.   LOS: 8 days   Skylyn Slezak 06/17/2013, 9:44 AM

## 2013-06-17 NOTE — Progress Notes (Signed)
Patient ID: Kimberly May, female   DOB: May 26, 1990, 23 y.o.   MRN: [id number]  Name: Kimberly T Boyajian MRN: [id number] DOB: 06/15/1990    ADMISSION DATE:  06/09/2013 CONSULTATION DATE: 06/10/13   REFERRING MD : Flo Shanks  PRIMARY SERVICE: ENT   CHIEF COMPLAINT: Post I&D of retropharyngeal abscess. Intubated  BRIEF PATIENT DESCRIPTION: 23 years old female with no significant PMH. Underwent I&D of retropharyngeal abscess and possible early mediastinitis. Ot of the OR intubated. PCCM consult called for mechanical ventilation management.   SIGNIFICANT EVENTS / STUDIES:  06/09/13 -> I&D retropharyngeal abscess  2/2- weaned, no leak  2/2 ct neck>>>Interval incision and drainage of suppurated left lateral  retropharyngeal lymph node. Persistent edema and phlegmon throughout  the retropharyngeal space without definite evidence of new abscess. 2/3 cxr unchanged. Bibasilar atelectasis 2/3 pro calcitonin 0.16 2/4 --> re-exploration of wound and trach placement 2/5 trach collar  LINES / TUBES:  - Peripheral IV's  - PICC 1/31 >> - foley 1-31 OTT>>2/3 2/3 trach  2/4 trach collar  CULTURES:  - FEW GROUP A STREP (S.PYOGENES) ISOLATED FROM PRIOR I&D ON 1/25  1/31 ana>> NGTD 1/31 wound>> Moderate strep pyogenes  ANTIBIOTICS:  1/30 Rocephin>>  -1/30 Flagyl>> 2/2 1/31 cleocin>> 2/2  SUBJECTIVE: Soft BP overnight, awake. Just back in bed fromchair. Nurse in room. Pt writes to ask if she is doing ok.  VITAL SIGNS: Temp:  [98 F (36.7 C)-99.9 F (37.7 C)] 99.9 F (37.7 C) (02/07 0800) Pulse Rate:  [67-134] 76 (02/07 0800) Resp:  [12-26] 17 (02/07 0800) BP: (115-157)/(64-99) 129/67 mmHg (02/07 0736) SpO2:  [87 %-97 %] 87 % (02/07 0800) FiO2 (%):  [40 %] 40 % (02/07 0736) HEMODYNAMICS:   VENTILATOR SETTINGS: Vent Mode:  [-]  FiO2 (%):  [40 %] 40 % INTAKE / OUTPUT: Intake/Output     02/06 0701 - 02/07 0700 02/07 0701 - 02/08 0700   I.V. (mL/kg) 180 (1.7) 20 (0.2)   IV Piggyback  50    Total Intake(mL/kg) 230 (2.2) 20 (0.2)   Urine (mL/kg/hr) 1875 (0.7)    Stool     Total Output 1875     Net -1645 +20         BP 129/67  Pulse 76  Temp(Src) 99.9 F (37.7 C) (Oral)  Resp 17  Ht 5' (1.524 m)  Wt 233 lb 7.5 oz (105.9 kg)  BMI 45.60 kg/m2  SpO2 87%  LMP 06/08/2013  PHYSICAL EXAMINATION: General: awake, no distress, mildly anxious today. Moves slowly with assistance. Morbidly obese Neuro: nonfocal  HEENT: wound dressed, swollen, conjunctival hemorrhages. Trach collar. No stridor PULM: CTA  94% on 40% collar CV: s1 s2 RRR  GI: soft, bs hypoactive , no r  Extremities: mild edema   LABS:  CBC  Recent Labs Lab 06/15/13 0800 06/16/13 0300 06/17/13 0525  WBC 9.5 9.9 10.3  HGB 8.3* 8.0* 8.2*  HCT 25.8* 24.6* 25.9*  PLT 181 215 239   Coag's  Recent Labs Lab 06/10/13 1900 06/11/13 0500 06/13/13 1030  APTT 32  --  29  INR  --  1.55* 1.27   BMET  Recent Labs Lab 06/15/13 0800 06/16/13 0300 06/17/13 0525  NA 143 143 144  K 3.9 3.6* 3.5*  CL 104 102 103  CO2 28 28 27   BUN 12 12 17   CREATININE 0.57 0.55 0.59  GLUCOSE 102* 98 92   Electrolytes  Recent Labs Lab 06/11/13 0500  06/13/13 0400  06/15/13 0800 06/16/13 0300  06/17/13 0525  CALCIUM 7.3*  < > 7.8*  < > 7.9* 8.1* 8.1*  MG 2.2  --  2.2  --  2.2  --   --   PHOS 3.9  --  4.0  --  3.4  --   --   < > = values in this interval not displayed. Sepsis Markers  Recent Labs Lab 06/10/13 1900 06/11/13 0923 06/12/13 0420 06/13/13 0400  LATICACIDVEN 0.5  --   --   --   PROCALCITON  --  0.18 0.10 0.16   ABG  Recent Labs Lab 06/10/13 1607 06/10/13 2144 06/11/13 0441  PHART 7.556* 7.412 7.292*  PCO2ART 25.3* 33.3* 46.0*  PO2ART 113.0* 133.0* 109.0*   Liver Enzymes  Recent Labs Lab 06/10/13 1900  AST 7  ALT 10  ALKPHOS 56  BILITOT 0.6  ALBUMIN 2.0*   Cardiac Enzymes  Recent Labs Lab 06/10/13 1900  TROPONINI <0.30   Glucose  Recent Labs Lab  06/16/13 1227 06/16/13 1636 06/16/13 2017 06/17/13 0041 06/17/13 0437 06/17/13 0737  GLUCAP 109* 116* 98 89 98 99    Imaging Dg Chest Port 1 View  06/17/2013   CLINICAL DATA:  Followup infiltrates  EXAM: PORTABLE CHEST - 1 VIEW  COMPARISON:  06/15/2013  FINDINGS: Cardiac shadow remains enlarged. Vascular congestion is noted. A right-sided PICC line is seen and stable. Tracheostomy tube is stable as well. .  IMPRESSION: Poor inspiratory effort. Increased central vascular congestion is noted   Electronically Signed   By: Alcide Clever M.D.   On: 06/17/2013 07:38   Dg Swallowing Func-speech Pathology  06/15/2013   [facility] [place], CCC-SLP     06/15/2013  2:40 PM Objective Swallowing Evaluation: Modified Barium Swallowing Study   Patient Details  Name: Kimberly T Bartow MRN: [id number] Date of Birth: 07/16/1990  Today's Date: 06/15/2013 Time: 4098-1191 SLP Time Calculation (min): 20 min  Past Medical History:  Past Medical History  Diagnosis Date  . Tuberculosis 15 YRS AGO     FATHER HAD TB , PT WAS MED [place]   Past Surgical History:  Past Surgical History  Procedure Laterality Date  . Tympanostomy tube placement  DONE TWICE     BILAT    [facility])  . Cesarean section  2009   . Tonsillectomy  04/10/2011    Procedure: TONSILLECTOMY;  Surgeon: Antony Contras;  Location:  [facility] OR;  Service: ENT;  Laterality: Bilateral;  . Incision and drainage abscess N/A 06/04/2013    Procedure: INCISION AND DRAINAGE PHARYNGEAL ABSCESS;  Surgeon:  Melvenia Beam, MD;  Location: [facility] OR;  Service: ENT;  Laterality:  N/A;  . Adenoidectomy    . Incision and drainage of peritonsillar abcess Left 06/09/2013    Procedure: INCISION AND DRAINAGE OF left retropharyneal  abscess;  Surgeon: Flo Shanks, MD;  Location: [facility] OR;  Service:  ENT;  Laterality: Left;   HPI:  23 years old female with no significant PMH. Underwent I&D of  retropharyngeal abscess and possible early mediastinitis 06/09/13.  2/2 s/p interval incision and drainage of suppurative  left  lateral retropharyngeal lymph node. Persistent edema and phlegmom  throughout the retropharyngeal space without evidence of new  abscess. 2/4 re-exploration of would with trach placement.  Discussed patient with Dr. Lazarus Salines who reported non-patent upper  airway with trach cuff deflation. Wishes to hold PMSV evaluation  until 2/6 however wants to proceed with bedside swallow eval to  determine need for non-oral means of nutrition.  Assessment / Plan / Recommendation Clinical Impression  Dysphagia Diagnosis: Severe pharyngeal phase dysphagia;Severe  cervical esophageal phase dysphagia Clinical impression: Patient presents with mild oral (due to c/o  lingual pain/edema) and a severe pharyngo-esophageal dysphagia  characterized by delayed oral transit and severe retropharyngeal  and posterior pharyngeal wall edema resulting in decreased  epiglottic deflection and UES relaxation and leading to silent  aspiration of thin liquids. Question nerve involvement given  silent nature of aspirates. SLP provided cueing for effortful  swallow and hard cough, neither of which were successful to  prevent, decrease, or clear aspirates. RN deep suctioned  following evaluation. Provided education regarding results and  plan. Prognosis for improvement good with decreased edema. SLP  will continue to f/u.     Treatment Recommendation  Therapy as outlined in treatment plan below    Diet Recommendation NPO;Alternative means - temporary   Medication Administration: Via alternative means    Other  Recommendations Recommended Consults: MBS (3-5 days) Oral Care Recommendations: Oral care Q4 per protocol   Follow Up Recommendations   (TBD)    Frequency and Duration min 3x week  2 weeks        General HPI: 23 years old female with no significant PMH.  Underwent I&D of retropharyngeal abscess and possible early  mediastinitis 06/09/13. 2/2 s/p interval incision and drainage of  suppurative left lateral retropharyngeal lymph node.  Persistent  edema and phlegmom throughout the retropharyngeal space without  evidence of new abscess. 2/4 re-exploration of would with trach  placement. Discussed patient with Dr. Lazarus Salines who reported  non-patent upper airway with trach cuff deflation. Wishes to hold  PMSV evaluation until 2/6 however wants to proceed with bedside  swallow eval to determine need for non-oral means of nutrition.  Type of Study: Modified Barium Swallowing Study Reason for Referral: Objectively evaluate swallowing function Previous Swallow Assessment: none Diet Prior to this Study: NPO Temperature Spikes Noted: No Respiratory Status: Trach collar Trach Size and Type: #6;Cuff History of Recent Intubation: Yes Length of Intubations (days): 4 days Date extubated: 06/13/13 Behavior/Cognition: Alert;Cooperative;Pleasant mood Oral Cavity - Dentition: Adequate natural dentition Oral Motor / Sensory Function: Impaired - see Bedside swallow  eval Self-Feeding Abilities: Able to feed self Patient Positioning: Upright in chair Baseline Vocal Quality: Aphonic (non-patient upper airway, no  pmsv in place) Volitional Cough: Strong Volitional Swallow: Able to elicit (mild facial grimacing) Anatomy:  (severe retropharyngeal and posterior pharyngeal wall  edema) Pharyngeal Secretions: Not observed secondary MBS    Reason for Referral Objectively evaluate swallowing function   Oral Phase Oral Preparation/Oral Phase Oral Phase: Impaired Oral - Thin Oral - Thin Teaspoon: Left anterior bolus loss;Right anterior  bolus loss;Reduced posterior propulsion;Delayed oral  transit;Lingual/palatal residue;Piecemeal swallowing Oral Phase - Comment Oral Phase - Comment: c/o lingual pain. Ulcer like appearance to  lingual tip   Pharyngeal Phase Pharyngeal Phase Pharyngeal Phase: Impaired Pharyngeal - Thin Pharyngeal - Thin Teaspoon: Delayed swallow initiation;Premature  spillage to valleculae;Penetration/Aspiration before  swallow;Pharyngeal residue -  valleculae;Reduced epiglottic  inversion (intermittent delay in initiation) Penetration/Aspiration details (thin teaspoon): Material enters  airway, passes BELOW cords without attempt by patient to eject  out (silent aspiration)  Cervical Esophageal Phase    GO    Cervical Esophageal Phase Cervical Esophageal Phase: Impaired Cervical Esophageal Phase - Thin Thin Teaspoon: Reduced cricopharyngeal relaxation        Ferdinand Lango MA, CCC-SLP [phone]  McCoy Leah Meryl 06/15/2013, 2:40 PM      CXR: small effusions  and bibasilar atelectasis, no major changes, ett wnl. Single view.  ASSESSMENT / PLAN:  PULMONARY  A:  1) Intubated post I&D of retropharyngeal abscess,ALI risk, effusion, pulm edema P:  -Trach collar as goal would like to avoid vent further -consider cuff reduction -would NOT pmv today -lasix see renal pcxr pos trach ordered today - c/w edema, consider firther lasix  CARDIOVASCULAR  A:  Normotension, sepsis  R/o pa htn, osa? P:  - a few soft BP overnight, overall normotensive -lasix -echo  RENAL  A:  1) edema, effusions. Hypokalemia P:  - Will follow chemistry.  - continue lasix to neg balance  GASTROINTESTINAL  A:   1) GI proph, dysphagia P:  - GI prophylaxis with protonix.  -slp, if fail then ngt  HEMATOLOGIC  A:  1) anemia, likely dilutional improved with lasix  P:  -cbc and BMP  Daily, with lasix use -hep sub q -mobilize  INFECTIOUS  A:  1) Retropharyngeal abscess - group A strep  2) Post I&D  No fever spikes P:  ID following  Rocephin continued p-op re-exploration yesterday with Trach placement reviewed  ENDOCRINE  A:  1) No issues  NEUROLOGIC  A:  1) Trach collar P:  - vent off -PT  Summary:  Trach collar tolerated this morning. Up to chair today. Lasix, echo for pa pressures, NO PMV                    K 3.5- replacement ordered  Waymon BudgeYOUNG,Roark Rufo D DO  CD Willett Lefeber, MD Pgr: [phone] [facility] After hours  [phone]

## 2013-06-17 NOTE — Progress Notes (Signed)
Physical Therapy Treatment Patient Details Name: Kimberly May MRN: [id number] DOB: [birth year] Today's Date: 06/17/2013 Time: [id number] PT Time Calculation (min): 24 min  PT Assessment / Plan / Recommendation  History of Present Illness Pt admit with VDRF with trach after emergent I&D for retropharyngeal abscess.     PT Comments   Patient making improvement with mobility today.  Motivated to improve.  Follow Up Recommendations  CIR;Supervision/Assistance - 24 hour     Does the patient have the potential to tolerate intense rehabilitation     Barriers to Discharge        Equipment Recommendations  Other (comment) (TBD)    Recommendations for Other Services OT consult;Rehab consult  Frequency Min 3X/week   Progress towards PT Goals Progress towards PT goals: Progressing toward goals  Plan Current plan remains appropriate    Precautions / Restrictions Precautions Precautions: Fall Restrictions Weight Bearing Restrictions: No   Pertinent Vitals/Pain     Mobility  Bed Mobility Overal bed mobility: Needs Assistance Bed Mobility: Supine to Sit Supine to sit: Min assist General bed mobility comments: Verbal cues for technique.  Assist to raise trunk to sitting position.  Patient with good sitting balance once upright. Transfers Overall transfer level: Needs assistance Equipment used: Rolling walker (2 wheeled) Transfers: Sit to/from UGI CorporationStand;Stand Pivot Transfers Sit to Stand: Min assist;+2 safety/equipment Stand pivot transfers: Min assist;+2 safety/equipment General transfer comment: Verbal cues for hand placement and technique.  Did well with moving to standing, assist for balance/safety.  In standing, patient took 50 steps in place with each LE.  HR increased to 124.  Patient rested in standing.  Patient then able to take steps to pivot to chair.      PT Goals (current goals can now be found in the care plan section)    Visit Information  Last PT Received On:  06/17/13 Assistance Needed: +2 History of Present Illness: Pt admit with VDRF with trach after emergent I&D for retropharyngeal abscess.      Subjective Data  Subjective: "This is my best day in a while" - patient wrote on clipboard   Cognition  Cognition Arousal/Alertness: Awake/alert Behavior During Therapy: WFL for tasks assessed/performed Overall Cognitive Status: Within Functional Limits for tasks assessed    Balance  Balance Standing balance support: Bilateral upper extremity supported Standing balance-Leahy Scale: Fair General Comments General comments (skin integrity, edema, etc.): Both eyes with blood in them.  Swollen all over.    End of Session PT - End of Session Equipment Utilized During Treatment: Gait belt;Oxygen Activity Tolerance: Patient tolerated treatment well;Patient limited by fatigue Patient left: in chair;with call bell/phone within reach Nurse Communication: Mobility status   GP     Vena AustriaDavis, Amaria Mundorf H 06/17/2013, 3:54 PM Durenda HurtSusan H. Renaldo May, PT, [facility] PaMBA Acute Rehab Services Pager [phone]

## 2013-06-17 NOTE — Progress Notes (Signed)
eLink Physician-Brief Progress Note Patient Name: Kimberly May DOB: 09-03-1990 MRN: [id number]  Date of Service  06/17/2013   HPI/Events of Note   Failed swallow Could not lie flat- so IR could not place NGT  eICU Interventions  Change to IV KCL   Intervention Category Minor Interventions: Electrolytes abnormality - evaluation and management  ALVA,RAKESH V. 06/17/2013, 5:38 PM

## 2013-06-18 DIAGNOSIS — E876 Hypokalemia: Secondary | ICD-10-CM

## 2013-06-18 LAB — CBC WITH DIFFERENTIAL/PLATELET
Basophils Absolute: 0 10*3/uL (ref 0.0–0.1)
Basophils Relative: 0 % (ref 0–1)
EOS ABS: 0.2 10*3/uL (ref 0.0–0.7)
EOS PCT: 2 % (ref 0–5)
HCT: 26 % — ABNORMAL LOW (ref 36.0–46.0)
Hemoglobin: 8.4 g/dL — ABNORMAL LOW (ref 12.0–15.0)
Lymphocytes Relative: 15 % (ref 12–46)
Lymphs Abs: 1.7 10*3/uL (ref 0.7–4.0)
MCH: 27.7 pg (ref 26.0–34.0)
MCHC: 32.3 g/dL (ref 30.0–36.0)
MCV: 85.8 fL (ref 78.0–100.0)
Monocytes Absolute: 0.8 10*3/uL (ref 0.1–1.0)
Monocytes Relative: 7 % (ref 3–12)
Neutro Abs: 9 10*3/uL — ABNORMAL HIGH (ref 1.7–7.7)
Neutrophils Relative %: 77 % (ref 43–77)
PLATELETS: 253 10*3/uL (ref 150–400)
RBC: 3.03 MIL/uL — AB (ref 3.87–5.11)
RDW: 13.7 % (ref 11.5–15.5)
WBC: 11.8 10*3/uL — ABNORMAL HIGH (ref 4.0–10.5)

## 2013-06-18 LAB — BASIC METABOLIC PANEL
BUN: 17 mg/dL (ref 6–23)
CALCIUM: 8.2 mg/dL — AB (ref 8.4–10.5)
CO2: 24 mEq/L (ref 19–32)
Chloride: 104 mEq/L (ref 96–112)
Creatinine, Ser: 0.57 mg/dL (ref 0.50–1.10)
GFR calc Af Amer: >90 mL/min (ref >90)
GLUCOSE: 92 mg/dL (ref 70–99)
Potassium: 3.6 mEq/L — ABNORMAL LOW (ref 3.7–5.3)
SODIUM: 144 meq/L (ref 137–147)

## 2013-06-18 LAB — GLUCOSE, CAPILLARY: Glucose-Capillary: 94 mg/dL (ref 70–99)

## 2013-06-18 MED ORDER — LIDOCAINE HCL 2 % EX GEL
Freq: Once | CUTANEOUS | Status: DC
  Filled 2013-06-18: qty 5

## 2013-06-18 NOTE — Progress Notes (Signed)
Physical Therapy Treatment Patient Details Name: Kimberly May MRN: [id number] DOB: 12/30/1990 Today's Date: 06/18/2013 Time: [id number] PT Time Calculation (min): 23 min  PT Assessment / Plan / Recommendation  History of Present Illness Pt admit with VDRF with trach after emergent I&D for retropharyngeal abscess.     PT Comments   Patient making gains with mobility and gait.  Agree with CIR for continued therapy.  Follow Up Recommendations  CIR;Supervision/Assistance - 24 hour     Does the patient have the potential to tolerate intense rehabilitation     Barriers to Discharge        Equipment Recommendations  Rolling walker with 5" wheels    Recommendations for Other Services Rehab consult  Frequency Min 3X/week   Progress towards PT Goals Progress towards PT goals: Progressing toward goals  Plan Current plan remains appropriate    Precautions / Restrictions Precautions Precautions: Fall Precaution Comments: On trach collar Restrictions Weight Bearing Restrictions: No   Pertinent Vitals/Pain HR at 134 during gait.    Mobility  Bed Mobility Overal bed mobility: Needs Assistance Bed Mobility: Supine to Sit Supine to sit: Min guard;HOB elevated General bed mobility comments: Verbal cues for technique.  Patient able to move to sitting using bed rail.  Good balance in sitting. Transfers Overall transfer level: Needs assistance Equipment used: Standard walker Transfers: Sit to/from Stand Sit to Stand: Min assist;+2 safety/equipment General transfer comment: Placed patient on portable O2 using trach collar connector.   Verbal cues for hand placement.  Patient able to move to standing with assist for balance/safety.  Stood for several seconds prior to beginning ambulation. Ambulation/Gait Ambulation/Gait assistance: Min assist;+2 safety/equipment Ambulation Distance (Feet): 104 Feet Assistive device: Rolling walker (2 wheeled) Gait Pattern/deviations: Step-through  pattern;Decreased step length - right;Decreased step length - left;Decreased stride length;Trunk flexed Gait velocity: slow gait speed Gait velocity interpretation: Below normal speed for age/gender General Gait Details: Verbal cues for safe use of RW.  Cues to stand upright and to move slowly to minimize fatigue.  Patient with fairly good balance using RW.      PT Goals (current goals can now be found in the care plan section)    Visit Information  Last PT Received On: 06/18/13 Assistance Needed: +2 (lines/tubes) History of Present Illness: Pt admit with VDRF with trach after emergent I&D for retropharyngeal abscess.      Subjective Data  Subjective: Excited about ambulating out of room.   Cognition  Cognition Arousal/Alertness: Awake/alert Behavior During Therapy: WFL for tasks assessed/performed Overall Cognitive Status: Within Functional Limits for tasks assessed    Balance     End of Session PT - End of Session Equipment Utilized During Treatment: Gait belt;Oxygen Activity Tolerance: Patient tolerated treatment well;Patient limited by fatigue Patient left: in chair;with call bell/phone within reach Nurse Communication: Mobility status   GP     Vena AustriaDavis, Atley Neubert H 06/18/2013, 11:14 AM Durenda HurtSusan H. Renaldo Fiddleravis, PT, [facility] Acute Rehab Services Pager [phone]

## 2013-06-18 NOTE — Progress Notes (Signed)
Patient ID: Kimberly T Beals, female   DOB: February 14, 1991, 23 y.o.   MRN: [id number]  Name: Kimberly May MRN: [id number] DOB: [birth year]    ADMISSION DATE:  06/09/2013 CONSULTATION DATE: 06/10/13   REFERRING MD : Flo Shanks  PRIMARY SERVICE: ENT   CHIEF COMPLAINT: Post I&D of retropharyngeal abscess. Intubated  BRIEF PATIENT DESCRIPTION: 23 years old female with no significant PMH. Underwent I&D of retropharyngeal abscess and possible early mediastinitis. Ot of the OR intubated. PCCM consult called for mechanical ventilation management.   SIGNIFICANT EVENTS / STUDIES:  06/09/13 -> I&D retropharyngeal abscess  2/2- weaned, no leak  2/2 ct neck>>>Interval incision and drainage of suppurated left lateral  retropharyngeal lymph node. Persistent edema and phlegmon throughout  the retropharyngeal space without definite evidence of new abscess. 2/3 cxr unchanged. Bibasilar atelectasis 2/3 pro calcitonin 0.16 2/4 --> re-exploration of wound and trach placement 2/5 trach collar 2/6 ECHO- EF 60-65%, PAS 39 2/7- failed swallowing eval  LINES / TUBES:  - Peripheral IV's  - PICC 1/31 >> - foley 1-31 OTT>>2/3 2/3 trach  2/4 trach collar  CULTURES:  - FEW GROUP A STREP (S.PYOGENES) ISOLATED FROM PRIOR I&D ON 1/25  1/31 ana>> NGTD 1/31 wound>> Moderate strep pyogenes  ANTIBIOTICS:  1/30 Rocephin>>  -1/30 Flagyl>> 2/2 1/31 cleocin>> 2/2  SUBJECTIVE: Mild headache, gives thumbs up.  VITAL SIGNS: Temp:  [98.1 F (36.7 C)-99.2 F (37.3 C)] 98.1 F (36.7 C) (02/08 0459) Pulse Rate:  [63-135] 77 (02/08 0700) Resp:  [12-33] 26 (02/08 0700) BP: (100-147)/(69-88) 137/85 mmHg (02/08 0459) SpO2:  [78 %-97 %] 93 % (02/08 0700) FiO2 (%):  [35 %-60 %] 40 % (02/08 0336) HEMODYNAMICS:   VENTILATOR SETTINGS: Vent Mode:  [-]  FiO2 (%):  [35 %-60 %] 40 % INTAKE / OUTPUT: Intake/Output     02/07 0701 - 02/08 0700 02/08 0701 - 02/09 0700   I.V. (mL/kg) 460 (4.3) 40 (0.4)   IV Piggyback 250     Total Intake(mL/kg) 710 (6.7) 40 (0.4)   Urine (mL/kg/hr)     Total Output       Net +710 +40        Urine Occurrence 4 x    Stool Occurrence 1 x     BP 137/85  Pulse 77  Temp(Src) 98.1 F (36.7 C) (Oral)  Resp 26  Ht 5' (1.524 m)  Wt 233 lb 7.5 oz (105.9 kg)  BMI 45.60 kg/m2  SpO2 93%  LMP 06/08/2013  PHYSICAL EXAMINATION: General: awake, no distress, . Moves slowly with assistance. Morbidly obese Neuro: nonfocal  HEENT: wound dressed, swollen, conjunctival hemorrhages. Trach collar. No stridor PULM: CTA  93% on 40% collar CV: s1 s2 RRR  GI: soft, bs hypoactive , no r  Extremities: mild edema   LABS:  CBC  Recent Labs Lab 06/16/13 0300 06/17/13 0525 06/18/13 0520  WBC 9.9 10.3 11.8*  HGB 8.0* 8.2* 8.4*  HCT 24.6* 25.9* 26.0*  PLT 215 239 253   Coag's  Recent Labs Lab 06/13/13 1030  APTT 29  INR 1.27   BMET  Recent Labs Lab 06/16/13 0300 06/17/13 0525 06/18/13 0520  NA 143 144 144  K 3.6* 3.5* 3.6*  CL 102 103 104  CO2 28 27 24   BUN 12 17 17   CREATININE 0.55 0.59 0.57  GLUCOSE 98 92 92   Electrolytes  Recent Labs Lab 06/13/13 0400  06/15/13 0800 06/16/13 0300 06/17/13 0525 06/18/13 0520  CALCIUM 7.8*  < > 7.9* 8.1*  8.1* 8.2*  MG 2.2  --  2.2  --   --   --   PHOS 4.0  --  3.4  --   --   --   < > = values in this interval not displayed. Sepsis Markers  Recent Labs Lab 06/12/13 0420 06/13/13 0400  PROCALCITON 0.10 0.16   ABG No results found for this basename: PHART, PCO2ART, PO2ART,  in the last 168 hours Liver Enzymes No results found for this basename: AST, ALT, ALKPHOS, BILITOT, ALBUMIN,  in the last 168 hours Cardiac Enzymes No results found for this basename: TROPONINI, PROBNP,  in the last 168 hours Glucose  Recent Labs Lab 06/17/13 0437 06/17/13 0737 06/17/13 1248 06/17/13 1648 06/17/13 2008 06/18/13 0018  GLUCAP 98 99 105* 106* 85 94    Imaging Dg Chest Port 1 View  06/17/2013   CLINICAL DATA:  Followup  infiltrates  EXAM: PORTABLE CHEST - 1 VIEW  COMPARISON:  06/15/2013  FINDINGS: Cardiac shadow remains enlarged. Vascular congestion is noted. A right-sided PICC line is seen and stable. Tracheostomy tube is stable as well. .  IMPRESSION: Poor inspiratory effort. Increased central vascular congestion is noted   Electronically Signed   By: Alcide CleverMark  Lukens M.D.   On: 06/17/2013 07:38     CXR: 2/6 Shallow insop, vascular congestion, trach wnl. Single view.  ASSESSMENT / PLAN:  PULMONARY  A:  1) Intubated post I&D of retropharyngeal abscess,ALI risk, effusion, pulm edema Failed swallow eval 2/7 P:  -Trach collar as goal would like to avoid vent further -consider cuff reduction -would NOT pmv today -lasix see renal   CARDIOVASCULAR  A:  Normotension, sepsis  R/o pa htn, osa? Echo 2/6- EF 60-65%, PAS 39 P:  - a few soft BP overnight, overall normotensive -lasix   RENAL  A:  1) edema, effusions. Hypokalemia   2/8 I&O +40 P:  - Will follow chemistry.  - continue lasix to neg balance  GASTROINTESTINAL  A:   1) GI proph, dysphagia P:  - GI prophylaxis with protonix.  -slp, if fail then ngt  HEMATOLOGIC  A:  1) anemia, likely dilutional improved with lasix  P:  -cbc and BMP  Daily, with lasix use -hep sub q -mobilize  INFECTIOUS  A:  1) Retropharyngeal abscess - group A strep  2) Post I&D  No fever spikes P:  ID following  Rocephin continued p-op re-exploration yesterday with Trach placement reviewed  ENDOCRINE  A:  1) No issues  NEUROLOGIC  A:  1) Trach collar P:  - vent off -PT  Summary:  Trach collar tolerated . Up to chair . Lasix,  NO PMV. Failed swallow eval                     Greg CutterYOUNG,Tuff Clabo D   CD Mance Vallejo, MD Pgr: [phone] [facility] After hours [phone]

## 2013-06-18 NOTE — Progress Notes (Signed)
Called Dr Pollyann Kennedyosen to clarify diet order.OK for patient to be on soft diet.

## 2013-06-18 NOTE — Progress Notes (Signed)
Received pt  From 2600 accpd by RN.  Does not appear to be in any distress.  RT notified of new pt to the floor.

## 2013-06-18 NOTE — Progress Notes (Signed)
Subjective: No complaints, just hungry.  Objective: Vital signs in last 24 hours: Temp:  [98.1 F (36.7 C)-99.2 F (37.3 C)] 98.3 F (36.8 C) (02/08 0800) Pulse Rate:  [63-135] 134 (02/08 0956) Resp:  [12-33] 26 (02/08 0700) BP: (100-147)/(69-88) 137/85 mmHg (02/08 0459) SpO2:  [78 %-97 %] 93 % (02/08 0700) FiO2 (%):  [35 %-60 %] 40 % (02/08 0336) Weight change:  Last BM Date: 06/17/13  Intake/Output from previous day: 02/07 0701 - 02/08 0700 In: 710 [I.V.:460; IV Piggyback:250] Out: -  Intake/Output this shift: Total I/O In: 40 [I.V.:40] Out: -   PHYSICAL EXAM: Awake and alert, sitting in the chair. Able to move into bed with ease. Dressing changed, drains advanced another 2 cm.   Lab Results:  Recent Labs  06/17/13 0525 06/18/13 0520  WBC 10.3 11.8*  HGB 8.2* 8.4*  HCT 25.9* 26.0*  PLT 239 253   BMET  Recent Labs  06/17/13 0525 06/18/13 0520  NA 144 144  K 3.5* 3.6*  CL 103 104  CO2 27 24  GLUCOSE 92 92  BUN 17 17  CREATININE 0.59 0.57  CALCIUM 8.1* 8.2*    Studies/Results: Dg Chest Port 1 View  06/17/2013   CLINICAL DATA:  Followup infiltrates  EXAM: PORTABLE CHEST - 1 VIEW  COMPARISON:  06/15/2013  FINDINGS: Cardiac shadow remains enlarged. Vascular congestion is noted. A right-sided PICC line is seen and stable. Tracheostomy tube is stable as well. .  IMPRESSION: Poor inspiratory effort. Increased central vascular congestion is noted   Electronically Signed   By: Alcide CleverMark  Lukens M.D.   On: 06/17/2013 07:38    Medications: I have reviewed the patient's current medications.  Assessment/Plan: Stable, continues to improve. Transfer to floor. Start soft diet.    LOS: 9 days   Narmeen Kerper 06/18/2013, 11:13 AM

## 2013-06-19 ENCOUNTER — Inpatient Hospital Stay (HOSPITAL_COMMUNITY): Payer: BC Managed Care – PPO

## 2013-06-19 ENCOUNTER — Encounter (HOSPITAL_COMMUNITY): Payer: Self-pay | Admitting: Otolaryngology

## 2013-06-19 DIAGNOSIS — L03221 Cellulitis of neck: Secondary | ICD-10-CM

## 2013-06-19 DIAGNOSIS — L0211 Cutaneous abscess of neck: Secondary | ICD-10-CM

## 2013-06-19 DIAGNOSIS — R5381 Other malaise: Secondary | ICD-10-CM

## 2013-06-19 LAB — BASIC METABOLIC PANEL
BUN: 14 mg/dL (ref 6–23)
CO2: 25 mEq/L (ref 19–32)
CREATININE: 0.54 mg/dL (ref 0.50–1.10)
Calcium: 8.2 mg/dL — ABNORMAL LOW (ref 8.4–10.5)
Chloride: 107 mEq/L (ref 96–112)
Glucose, Bld: 94 mg/dL (ref 70–99)
Potassium: 3.6 mEq/L — ABNORMAL LOW (ref 3.7–5.3)
Sodium: 144 mEq/L (ref 137–147)

## 2013-06-19 LAB — CBC WITH DIFFERENTIAL/PLATELET
BASOS PCT: 0 % (ref 0–1)
Basophils Absolute: 0 10*3/uL (ref 0.0–0.1)
Eosinophils Absolute: 0.2 10*3/uL (ref 0.0–0.7)
Eosinophils Relative: 2 % (ref 0–5)
HEMATOCRIT: 25.6 % — AB (ref 36.0–46.0)
Hemoglobin: 8.2 g/dL — ABNORMAL LOW (ref 12.0–15.0)
Lymphocytes Relative: 16 % (ref 12–46)
Lymphs Abs: 1.5 10*3/uL (ref 0.7–4.0)
MCH: 27.4 pg (ref 26.0–34.0)
MCHC: 32 g/dL (ref 30.0–36.0)
MCV: 85.6 fL (ref 78.0–100.0)
MONO ABS: 0.9 10*3/uL (ref 0.1–1.0)
Monocytes Relative: 10 % (ref 3–12)
Neutro Abs: 6.5 10*3/uL (ref 1.7–7.7)
Neutrophils Relative %: 72 % (ref 43–77)
Platelets: 230 10*3/uL (ref 150–400)
RBC: 2.99 MIL/uL — ABNORMAL LOW (ref 3.87–5.11)
RDW: 13.9 % (ref 11.5–15.5)
WBC: 9 10*3/uL (ref 4.0–10.5)

## 2013-06-19 MED ORDER — ALTEPLASE 2 MG IJ SOLR
2.0000 mg | Freq: Once | INTRAMUSCULAR | Status: AC
  Administered 2013-06-19: 2 mg
  Filled 2013-06-19: qty 2

## 2013-06-19 MED ORDER — SODIUM CHLORIDE 0.9 % IJ SOLN
10.0000 mL | INTRAMUSCULAR | Status: DC | PRN
  Administered 2013-06-19 – 2013-06-23 (×6): 10 mL
  Administered 2013-06-23: 20 mL
  Administered 2013-06-23: 10 mL
  Administered 2013-06-24 – 2013-06-25 (×2): 20 mL

## 2013-06-19 NOTE — Progress Notes (Signed)
06/19/2013 8:33 AM  [place], [place] [id number]  Post-Op Day 9/5    Temp:  [98.2 F (36.8 C)-98.6 F (37 C)] 98.3 F (36.8 C) (02/09 0516) Pulse Rate:  [69-134] 72 (02/09 0516) Resp:  [13-25] 19 (02/09 0516) BP: (113-149)/(57-93) 149/71 mmHg (02/09 0516) SpO2:  [90 %-95 %] 93 % (02/09 0516) FiO2 (%):  [40 %] 40 % (02/09 0516),     Intake/Output Summary (Last 24 hours) at 06/19/13 0833 Last data filed at 06/18/13 1100  Gross per 24 hour  Intake      0 ml  Output    451 ml  Net   -451 ml    Results for orders placed during the hospital encounter of 06/09/13 (from the past 24 hour(s))  CBC WITH DIFFERENTIAL     Status: Abnormal   Collection Time    06/19/13  5:22 AM      Result Value Range   WBC 9.0  4.0 - 10.5 K/uL   RBC 2.99 (*) 3.87 - 5.11 MIL/uL   Hemoglobin 8.2 (*) 12.0 - 15.0 g/dL   HCT 40.925.6 (*) 81.136.0 - 91.446.0 %   MCV 85.6  78.0 - 100.0 fL   MCH 27.4  26.0 - 34.0 pg   MCHC 32.0  30.0 - 36.0 g/dL   RDW 78.213.9  95.611.5 - 21.315.5 %   Platelets 230  150 - 400 K/uL   Neutrophils Relative % 72  43 - 77 %   Neutro Abs 6.5  1.7 - 7.7 K/uL   Lymphocytes Relative 16  12 - 46 %   Lymphs Abs 1.5  0.7 - 4.0 K/uL   Monocytes Relative 10  3 - 12 %   Monocytes Absolute 0.9  0.1 - 1.0 K/uL   Eosinophils Relative 2  0 - 5 %   Eosinophils Absolute 0.2  0.0 - 0.7 K/uL   Basophils Relative 0  0 - 1 %   Basophils Absolute 0.0  0.0 - 0.1 K/uL  BASIC METABOLIC PANEL     Status: Abnormal   Collection Time    06/19/13  5:22 AM      Result Value Range   Sodium 144  137 - 147 mEq/L   Potassium 3.6 (*) 3.7 - 5.3 mEq/L   Chloride 107  96 - 112 mEq/L   CO2 25  19 - 32 mEq/L   Glucose, Bld 94  70 - 99 mg/dL   BUN 14  6 - 23 mg/dL   Creatinine, Ser 0.860.54  0.50 - 1.10 mg/dL   Calcium 8.2 (*) 8.4 - 10.5 mg/dL   GFR calc non Af Amer >90  >90 mL/min   GFR calc Af Amer >90  >90 mL/min   Taking po soft solids.  Drains advanced with minimal drainage.  Flexible laryngoscopy with 2% viscous xylocaine  topical anesthesia still shows crowded oropharynx with frothy clear secretions.  Could not pass any air in or out with finger occlusion of trach.  Scope per trach shows clear airway to carina.  SUBJECTIVE:  Less pain.  Still gets short winded.  Slept up in chair.  Ambulating in halls.  Eating soft solids.    OBJECTIVE:  Less drainage.  Oral cavity clean and moist.  IMPRESSION:  Slow improvement.  Still swollen pharynx and larynx.  PLAN:  Will change to cuffless trach tube when disposable inner cannulae are available.  Cont IV rocephin.    Flo ShanksWOLICKI, Kimberly May

## 2013-06-19 NOTE — Progress Notes (Signed)
Patient ID: Kimberly May, female   DOB: Dec 07, 1990, 23 y.o.   MRN: [id number]  Name: Kimberly T Armor MRN: [id number] DOB: Dec 07, 1990    ADMISSION DATE:  06/09/2013 CONSULTATION DATE: 06/10/13   REFERRING MD : Flo ShanksKarol Wolicki  PRIMARY SERVICE: ENT   CHIEF COMPLAINT: Post I&D of retropharyngeal abscess. Intubated  BRIEF PATIENT DESCRIPTION: 23 years old female with no significant PMH. Underwent I&D of retropharyngeal abscess and possible early mediastinitis. Out of the OR intubated. PCCM consult called for mechanical ventilation management.   SIGNIFICANT EVENTS / STUDIES:  1/30 -> I&D retropharyngeal abscess  2/2 ct neck>>>Interval incision and drainage of suppurated left lateral retropharyngeal lymph node. Persistent edema and phlegmon throughout the retropharyngeal space without definite evidence of new abscess. 2/4 --> re-exploration of wound and trach placement 2/5 trach collar 2/6 ECHO- EF 60-65%, PAS 39 2/8 to floor  LINES / TUBES:  PICC 1/31 >> 1-31 OTT>>2/3 2/3 trach  2/4 trach collar  CULTURES:  - FEW GROUP A STREP (S.PYOGENES) ISOLATED FROM PRIOR I&D ON 1/25  1/31 ana>> Neg 1/31 wound>> Moderate strep pyogenes  ANTIBIOTICS:  1/30 Rocephin>>  -1/30 Flagyl>> 2/2 1/31 cleocin>> 2/2  SUBJECTIVE: AWAKE AND ALERT  VITAL SIGNS: Temp:  [98.2 F (36.8 C)-98.6 F (37 C)] 98.3 F (36.8 C) (02/09 0516) Pulse Rate:  [69-95] 72 (02/09 0516) Resp:  [13-25] 19 (02/09 0516) BP: (113-149)/(57-93) 149/71 mmHg (02/09 0516) SpO2:  [90 %-95 %] 93 % (02/09 0516) FiO2 (%):  [40 %] 40 % (02/09 0516) INTAKE / OUTPUT: Intake/Output     02/08 0701 - 02/09 0700 02/09 0701 - 02/10 0700   I.V. (mL/kg) 40 (0.4)    IV Piggyback     Total Intake(mL/kg) 40 (0.4)    Urine (mL/kg/hr) 450 (0.2)    Stool 1 (0)    Total Output 451     Net -411          Urine Occurrence 1 x      PHYSICAL EXAMINATION: General: awake, no distress, . Moves slowly with assistance. Morbidly obese Neuro:  nonfocal  HEENT: wound dressed, swollen, conjunctival hemorrhages. Trach collar. No stridor PULM: CTA  93% on 40% collar CV: s1 s2 RRR  GI: soft, bs hypoactive , no r  Extremities: mild edema   LABS:  CBC  Recent Labs Lab 06/17/13 0525 06/18/13 0520 06/19/13 0522  WBC 10.3 11.8* 9.0  HGB 8.2* 8.4* 8.2*  HCT 25.9* 26.0* 25.6*  PLT 239 253 230   Coag's  Recent Labs Lab 06/13/13 1030  APTT 29  INR 1.27   BMET  Recent Labs Lab 06/17/13 0525 06/18/13 0520 06/19/13 0522  NA 144 144 144  K 3.5* 3.6* 3.6*  CL 103 104 107  CO2 27 24 25   BUN 17 17 14   CREATININE 0.59 0.57 0.54  GLUCOSE 92 92 94   Electrolytes  Recent Labs Lab 06/13/13 0400  06/15/13 0800  06/17/13 0525 06/18/13 0520 06/19/13 0522  CALCIUM 7.8*  < > 7.9*  < > 8.1* 8.2* 8.2*  MG 2.2  --  2.2  --   --   --   --   PHOS 4.0  --  3.4  --   --   --   --   < > = values in this interval not displayed. Sepsis Markers  Recent Labs Lab 06/13/13 0400  PROCALCITON 0.16   Glucose  Recent Labs Lab 06/17/13 0437 06/17/13 0737 06/17/13 1248 06/17/13 1648 06/17/13 2008 06/18/13 0018  GLUCAP 98 99 105* 106* 85 94    Imaging No results found.  ASSESSMENT / PLAN:    A: Acute respiratory failure 2nd to retropharygneal abscess s/p I&D, pleural effusion, pulmonary edema.  S/p tracheostomy.   P:  -trach collar as tolerated  -post-op, trach care per ENT -bronchial hygiene -consider cuff reduction -day of Abx 11, currently on rocephin per ENT -f/u CXR as needed  A: Dysphagia. P: -D3 diet  A: Sepsis >> resolved. P: -monitor hemodynamics  A: Anemia of critical illness; also component of hemodiluation. P: F/u CBC   Summary:  Trach collar tolerated . Up to chair . Eating dysphagia diet.                     Brett Canales Minor ACNP Ad[facility] Pollack PCCM Pager [phone] till 3 pm If no answer page [phone] 06/19/2013, 10:26 AM   Reviewed above, examined pt, and documentation changes made as  needed.  PCCM will sign off.  Please call if additional help is needed while she is in hospital.  Coralyn Helling, MD [facility] 06/19/2013, 1:00 PM Pager:  [phone] After 3pm call: [phone]

## 2013-06-19 NOTE — Progress Notes (Signed)
Speech Language Pathology Treatment: Dysphagia;Passy Muir Speaking valve  Patient Details Name: Kimberly May MRN: [id number] DOB: [birth year] Today's Date: 06/19/2013 Time: [id number] SLP Time Calculation (min): 30 min  Assessment / Plan / Recommendation Clinical Impression  Pt seen for f/u dysphagia and PMSV treatment. Pt's cuff was deflated at baseline, and she reports that she has been tracheally expectorating her secretions. No phonation was achieved with digital occlusion by SLP. Pt donned PMSV for 3-5 seconds, achieving minimal phonation that was hoarse and very low in intensity. Breath support was reduced to single words and short phrases. While SpO2 remained in the mid 90s, pt requested that PMSV was removed and a strong burst of air followed, indicative of CO2 retention. Pt's upper airway edema continues to be a barrier to PMSV tolerance, however mild improvements are noted. Continue to recommend PMSV trials with SLP only at this time.  Per chart review, note that pt was initiated on a Dys 3/thin liquid diet over the weekend after MBS 2/5 revealed silent aspiration of thin liquids due to significant edema impeding epiglottic deflection and airway protection. Given that pt's upper airway remains edematous per ENT note and per signs from PMSV trials, aspiration risk islikely still high at this time. Pt consumed cup sips of thin liquids with delayed coughing and consistent drops in SpO2 to the low 90s. Pt reports that she is eating some pureed foods, but that she is nervous about eating anything more solid. Recommend to repeat MBS to assess for increased airway protection and aspiration risk with current diet.   HPI HPI: 23 years old female with no significant PMH. Underwent I&D of retropharyngeal abscess and possible early mediastinitis 06/09/13. 2/2 s/p interval incision and drainage of suppurative left lateral retropharyngeal lymph node. Persistent edema and phlegmom throughout the  retropharyngeal space without evidence of new abscess. 2/4 re-exploration of would with trach placement. Discussed patient with Dr. Lazarus SalinesWolicki who reported non-patent upper airway with trach cuff deflation. Wishes to hold PMSV evaluation until 2/6 however wants to proceed with bedside swallow eval to determine need for non-oral means of nutrition.    Pertinent Vitals N/A  SLP Plan  MBS    Recommendations Diet recommendations: Dysphagia 1 (puree);Thin liquid Liquids provided via: Cup;No straw Medication Administration: Crushed with puree Supervision: Patient able to self feed;Full supervision/cueing for compensatory strategies Compensations: Slow rate;Small sips/bites;Multiple dry swallows after each bite/sip Postural Changes and/or Swallow Maneuvers: Seated upright 90 degrees;Upright 30-60 min after meal      Patient may use [name] Speech Valve: with SLP only PMSV Supervision: Full MD: Please consider changing trach tube to : Smaller size;Cuffless       Oral Care Recommendations: Oral care BID Follow up Recommendations: Home health SLP Plan: MBS    GO      Maxcine HamLaura Paiewonsky, M.A. CCC-SLP [phone]  Maxcine Hamaiewonsky, Mairely Foxworth 06/19/2013, 12:26 PM

## 2013-06-19 NOTE — Procedures (Signed)
Objective Swallowing Evaluation: Modified Barium Swallowing Study  Patient Details  Name: Kimberly May MRN: [id number] Date of Birth: 07-20-1990  Today's Date: 06/19/2013 Time: 4098-1191 SLP Time Calculation (min): 21 min  Past Medical History:  Past Medical History  Diagnosis Date  . Tuberculosis 15 YRS AGO     FATHER HAD TB , PT WAS MED TX   Past Surgical History:  Past Surgical History  Procedure Laterality Date  . Tympanostomy tube placement  DONE TWICE     BILAT    [facility])  . Cesarean section  2009   . Tonsillectomy  04/10/2011    Procedure: TONSILLECTOMY;  Surgeon: Antony Contras;  Location: [facility] OR;  Service: ENT;  Laterality: Bilateral;  . Incision and drainage abscess N/A 06/04/2013    Procedure: INCISION AND DRAINAGE PHARYNGEAL ABSCESS;  Surgeon: Melvenia Beam, MD;  Location: [facility] OR;  Service: ENT;  Laterality: N/A;  . Adenoidectomy    . Incision and drainage of peritonsillar abcess Left 06/09/2013    Procedure: INCISION AND DRAINAGE OF left retropharyneal abscess;  Surgeon: Flo Shanks, MD;  Location: [facility] OR;  Service: ENT;  Laterality: Left;  . Tracheostomy tube placement N/A 06/14/2013    Procedure: TRACHEOSTOMY;  Surgeon: Flo Shanks, MD;  Location: [facility] OR;  Service: ENT;  Laterality: N/A;  . Direct laryngoscopy N/A 06/14/2013    Procedure: DIRECT LARYNGOSCOPY/RE-EXPLORATORY OF NECK;  Surgeon: Flo Shanks, MD;  Location: [facility] OR;  Service: ENT;  Laterality: N/A;   HPI:  23 years old female with no significant PMH. Underwent I&D of retropharyngeal abscess and possible early mediastinitis 06/09/13. 2/2 s/p interval incision and drainage of suppurative left lateral retropharyngeal lymph node. Persistent edema and phlegmom throughout the retropharyngeal space without evidence of new abscess. 2/4 re-exploration of would with trach placement. Discussed patient with Dr. Lazarus Salines who reported non-patent upper airway with trach cuff deflation. Pt unable to tolerate PMSV at this  time, and noted to have silent aspiration with thin liquids during MBS 2/5. MD initiated Dys 3 diet and thin liquids 2/8, with pt exhibiting positive s/s of aspiration at bedside. Repeat MBS to assess aspiration risk and to determine least restrictive diet recommendations.     Assessment / Plan / Recommendation Clinical Impression  Dysphagia Diagnosis: Moderate pharyngeal phase dysphagia;Severe pharyngeal phase dysphagia;Mild oral phase dysphagia Clinical impression: Pt presents with a mild oral dysphagia and a moderate-severe pharyngeal dysphagia, which appears to be primarily impacted by persistent retropharyngeal and posterior pharyngeal wall edema. Delayed oral transit, intermittently delayed initiation, and decreased epiglottic inversion/airway protection lead to silent aspiration of thin liquids and sensed penetration of Dys 3 textures. Patient demonstrated adequate airway protection with Dys 1 textures and nectar thick liquids, with small bites and a second swallow to reduce vallecular residuals. Recommend to initiate Dys 1 textures and nectar thick liquids with compensatory strategies listed above. SLP will continue to follow for advancement as pharyngeal edema improves.    Treatment Recommendation  Therapy as outlined in treatment plan below    Diet Recommendation Dysphagia 1 (Puree);Nectar-thick liquid   Liquid Administration via: Cup;Straw Medication Administration: Crushed with puree Supervision: Patient able to self feed;Full supervision/cueing for compensatory strategies Compensations: Slow rate;Small sips/bites;Multiple dry swallows after each bite/sip Postural Changes and/or Swallow Maneuvers: Seated upright 90 degrees;Upright 30-60 min after meal    Other  Recommendations Oral Care Recommendations: Oral care BID   Follow Up Recommendations  Home health SLP    Frequency and Duration min 3x week  2 weeks  Pertinent Vitals/Pain N/A    SLP Swallow Goals     General Date  of Onset: 06/04/13 HPI: 23 years old female with no significant PMH. Underwent I&D of retropharyngeal abscess and possible early mediastinitis 06/09/13. 2/2 s/p interval incision and drainage of suppurative left lateral retropharyngeal lymph node. Persistent edema and phlegmom throughout the retropharyngeal space without evidence of new abscess. 2/4 re-exploration of would with trach placement. Discussed patient with Dr. Lazarus SalinesWolicki who reported non-patent upper airway with trach cuff deflation. Pt unable to tolerate PMSV at this time, and noted to have silent aspiration with thin liquids during MBS 2/5. MD initiated Dys 3 diet and thin liquids 2/8, with pt exhibiting positive s/s of aspiration at bedside. Repeat MBS to assess aspiration risk and to determine least restrictive diet recommendations. Type of Study: Modified Barium Swallowing Study Reason for Referral: Objectively evaluate swallowing function Previous Swallow Assessment: MBS 2/5 with silent aspiration of thin liquids Diet Prior to this Study: Dysphagia 3 (soft);Thin liquids Temperature Spikes Noted: No Respiratory Status: Trach collar Trach Size and Type: #6;Cuff;Deflated;With PMSV not in place History of Recent Intubation: Yes Length of Intubations (days): 4 days Date extubated: 06/13/13 Behavior/Cognition: Alert;Cooperative;Pleasant mood Oral Cavity - Dentition: Adequate natural dentition Oral Motor / Sensory Function: Impaired - see Bedside swallow eval Self-Feeding Abilities: Able to feed self Patient Positioning: Upright in chair Baseline Vocal Quality: Aphonic (PMSV not in place due to decreased upper airway patency) Volitional Cough: Strong (tracheal expectoration as PMSV not in place) Anatomy: Other (Comment) (trach noted as well as pharyngeal wall edema) Pharyngeal Secretions: Not observed secondary MBS    Reason for Referral Objectively evaluate swallowing function   Oral Phase Oral Preparation/Oral Phase Oral Phase:  Impaired Oral - Nectar Oral - Nectar Cup: Weak lingual manipulation;Reduced posterior propulsion;Delayed oral transit Oral - Nectar Straw: Weak lingual manipulation;Reduced posterior propulsion;Delayed oral transit Oral - Thin Oral - Thin Teaspoon: Not tested Oral - Thin Cup: Weak lingual manipulation;Reduced posterior propulsion;Delayed oral transit Oral - Solids Oral - Puree: Weak lingual manipulation;Reduced posterior propulsion;Delayed oral transit Oral - Mechanical Soft: Weak lingual manipulation;Reduced posterior propulsion;Delayed oral transit;Impaired mastication Oral Phase - Comment Oral Phase - Comment: pt reports that her teeth hurt   Pharyngeal Phase Pharyngeal Phase Pharyngeal Phase: Impaired Pharyngeal - Nectar Pharyngeal - Nectar Cup: Delayed swallow initiation;Premature spillage to valleculae;Reduced epiglottic inversion;Pharyngeal residue - valleculae;Compensatory strategies attempted (Comment) (swallow x2 to reduce residuals) Pharyngeal - Nectar Straw: Delayed swallow initiation;Premature spillage to valleculae;Reduced epiglottic inversion;Pharyngeal residue - valleculae;Compensatory strategies attempted (Comment) (swallow x2 to reduce residuals) Pharyngeal - Thin Pharyngeal - Thin Teaspoon: Not tested Pharyngeal - Thin Cup: Delayed swallow initiation;Premature spillage to valleculae;Reduced epiglottic inversion;Pharyngeal residue - valleculae;Penetration/Aspiration during swallow;Penetration/Aspiration after swallow Penetration/Aspiration details (thin cup): Material enters airway, passes BELOW cords without attempt by patient to eject out (silent aspiration) Pharyngeal - Solids Pharyngeal - Puree: Delayed swallow initiation;Premature spillage to valleculae;Reduced epiglottic inversion;Pharyngeal residue - valleculae;Compensatory strategies attempted (Comment) (swallow x2 to reduce residuals) Pharyngeal - Mechanical Soft: Delayed swallow initiation;Premature spillage to  valleculae;Reduced epiglottic inversion;Pharyngeal residue - valleculae;Penetration/Aspiration before swallow Penetration/Aspiration details (mechanical soft): Material enters airway, remains ABOVE vocal cords and not ejected out  Cervical Esophageal Phase    GO    Cervical Esophageal Phase Cervical Esophageal Phase: [facility] Cervical Esophageal Phase - Thin Thin Teaspoon: Not tested         Maxcine HamLaura Paiewonsky, M.A. CCC-SLP [phone]  Maxcine Hamaiewonsky, Euline Kimbler 06/19/2013, 2:59 PM

## 2013-06-19 NOTE — Consult Note (Signed)
Physical Medicine and Rehabilitation Consult Reason for Consult: Deconditioning after I&D for retropharyngeal abscess/tracheostomy Referring Physician: Dr. Lazarus SalinesWolicki   HPI: Kimberly May is a 23 y.o. right-handed female with complicated history status post tonsillectomy and right T-tube by Dr. Jenne PaneBates 2012. Admitted 06/09/2013 with sore throat x2 weeks as well low-grade fever. CT and imaging revealed abscess versus phlegom retropharynx with enlarged left retropharyngeal node. Underwent throat exploration adenoidectomy needle aspiration with no frank pus identified. Gram stain showed gram-positive organisms in pairs and gram-negative organisms. Placed on broad-spectrum antibiotics.. underwent emergent irrigation and drainage left deep neck infection retropharyngeal abscess per Dr. Lazarus SalinesWolicki 06/10/2013. Critical care consult for mechanical ventilation management. Hospital course complicated by airway issues necessitating the need tracheostomy 06/14/2013 as well as left neck exploration. Infectious disease has been consulted and followed up for streptococcal pharyngeal abscess and only mediastinitis and advise ceftriaxone for 14 days following surgery of January 31. Patient is currently maintained on a dysphagia 3 thin liquid diet. Physical occupational therapy evaluations completed an ongoing with recommendations for physical medicine rehabilitation consult.   Review of Systems  Constitutional: Positive for fever.  HENT: Positive for sore throat.   All other systems reviewed and are negative.   Past Medical History  Diagnosis Date  . Tuberculosis 15 YRS AGO     FATHER HAD TB , PT WAS MED TX   Past Surgical History  Procedure Laterality Date  . Tympanostomy tube placement  DONE TWICE     [facility]ILAT    [facility]  . Cesarean section  2009   . Tonsillectomy  04/10/2011    Procedure: TONSILLECTOMY;  Surgeon: Antony Contraswight D Bates;  Location: [facility] OR;  Service: ENT;  Laterality: Bilateral;  . Incision  and drainage abscess N/A 06/04/2013    Procedure: INCISION AND DRAINAGE PHARYNGEAL ABSCESS;  Surgeon: Melvenia BeamMitchell Gore, MD;  [facility];  Service: ENT;  Laterality: N/A;  . Adenoidectomy    . Incision and drainage of peritonsillar abcess Left 06/09/2013    Procedure: INCISION AND DRAINAGE OF left retropharyneal abscess;  Surgeon: Flo ShanksKarol Wolicki, MD;  [facility];  Service: ENT;  Laterality: Left;   Family History  Problem Relation Age of Onset  . Cancer Father    Social History:  reports that she has never smoked. She has never used smokeless tobacco. She reports that she does not drink alcohol or use illicit drugs. Allergies:  Allergies  Allergen Reactions  . Penicillins Rash   Medications Prior to Admission  Medication Sig Dispense Refill  . clindamycin (CLEOCIN) 75 MG/5ML solution Take 10 mLs by mouth 4 (four) times daily.      Marland Kitchen. HYDROcodone-acetaminophen (HYCET) 7.5-325 mg/15 ml solution Take 15 mLs by mouth every 6 (six) hours as needed. pain      . ondansetron (ZOFRAN-ODT) 4 MG disintegrating tablet Take 4 mg by mouth every 8 (eight) hours as needed. nausea      . prednisoLONE (PRELONE) 15 MG/5ML SOLN Take 10 mLs by mouth daily. For 3 days      . etonogestrel (IMPLANON) 68 MG IMPL implant Inject 1 each into the skin once.          Home: Home Living Family/patient expects to be discharged to:: Private residence Living Arrangements: Parent Available Help at Discharge: Family;Available 24 hours/day Type of Home: House Home Access: Ramped entrance;Stairs to enter Entrance Stairs-Number of Steps: 4 Home Layout: One level Home Equipment: None Additional Comments: Pt works as Conservation officer, naturecashier.  Functional History:  Functional Status:  Mobility:     Ambulation/Gait Ambulation Distance (Feet): 104 Feet Gait velocity: slow gait speed General Gait Details: Verbal cues for safe use of RW.  Cues to stand upright and to move slowly to minimize fatigue.  Patient with fairly good  balance using RW.    ADL:    Cognition: Cognition Overall Cognitive Status: Within Functional Limits for tasks assessed Orientation Level: Oriented X4 Cognition Arousal/Alertness: Awake/alert Behavior During Therapy: WFL for tasks assessed/performed Overall Cognitive Status: Within Functional Limits for tasks assessed Difficult to assess due to: Tracheostomy  Blood pressure 143/81, pulse 69, temperature 98.3 F (36.8 C), temperature source Oral, resp. rate 18, height 5' (1.524 m), weight 105.9 kg (233 lb 7.5 oz), last menstrual period 06/08/2013, SpO2 92.00%. Physical Exam  Constitutional:  23 year old female sitting up in chair.  HENT:  Head: Normocephalic.  Eyes: EOM are normal.  Neck:  Tracheostomy tube in place, #6. Not using valve this am  Cardiovascular: Normal rate and regular rhythm.   Respiratory: Effort normal and breath sounds normal. No respiratory distress.  GI: Soft. Bowel sounds are normal. She exhibits no distension.  Musculoskeletal:  Patient is moving all extremities  Neurological: She is alert. No cranial nerve deficit. Coordination normal.  Patient makes good eye contact with examiner. She is appropriate yes no and follows full commands. Strength 5/5  Psychiatric: She has a normal mood and affect. Her behavior is normal. Judgment and thought content normal.    Results for orders placed during the hospital encounter of 06/09/13 (from the past 24 hour(s))  CBC WITH DIFFERENTIAL     Status: Abnormal   Collection Time    06/19/13  5:22 AM      Result Value Range   WBC 9.0  4.0 - 10.5 K/uL   RBC 2.99 (*) 3.87 - 5.11 MIL/uL   Hemoglobin 8.2 (*) 12.0 - 15.0 g/dL   HCT 11.9 (*) 14.7 - 82.9 %   MCV 85.6  78.0 - 100.0 fL   MCH 27.4  26.0 - 34.0 pg   MCHC 32.0  30.0 - 36.0 g/dL   RDW 56.2  13.0 - 86.5 %   Platelets 230  150 - 400 K/uL   Neutrophils Relative % 72  43 - 77 %   Neutro Abs 6.5  1.7 - 7.7 K/uL   Lymphocytes Relative 16  12 - 46 %   Lymphs Abs  1.5  0.7 - 4.0 K/uL   Monocytes Relative 10  3 - 12 %   Monocytes Absolute 0.9  0.1 - 1.0 K/uL   Eosinophils Relative 2  0 - 5 %   Eosinophils Absolute 0.2  0.0 - 0.7 K/uL   Basophils Relative 0  0 - 1 %   Basophils Absolute 0.0  0.0 - 0.1 K/uL  BASIC METABOLIC PANEL     Status: Abnormal   Collection Time    06/19/13  5:22 AM      Result Value Range   Sodium 144  137 - 147 mEq/L   Potassium 3.6 (*) 3.7 - 5.3 mEq/L   Chloride 107  96 - 112 mEq/L   CO2 25  19 - 32 mEq/L   Glucose, Bld 94  70 - 99 mg/dL   BUN 14  6 - 23 mg/dL   Creatinine, Ser 7.84  0.50 - 1.10 mg/dL   Calcium 8.2 (*) 8.4 - 10.5 mg/dL   GFR calc non Af Amer >90  >90 mL/min   GFR calc Af  Amer >90  >90 mL/min   Dg Chest Port 1 View  06/17/2013   CLINICAL DATA:  Followup infiltrates  EXAM: PORTABLE CHEST - 1 VIEW  COMPARISON:  06/15/2013  FINDINGS: Cardiac shadow remains enlarged. Vascular congestion is noted. A right-sided PICC line is seen and stable. Tracheostomy tube is stable as well. .  IMPRESSION: Poor inspiratory effort. Increased central vascular congestion is noted   Electronically Signed   By: Alcide Clever M.D.   On: 06/17/2013 07:38    Assessment/Plan: Diagnosis: retropharyngeal abscess, respiratory failure with trach 1. Does the need for close, 24 hr/day medical supervision in concert with the patient's rehab needs make it unreasonable for this patient to be served in a less intensive setting? No 2. Co-Morbidities requiring supervision/potential complications: see above 3. Due to bladder management, bowel management, safety, skin/wound care, disease management and medication administration, does the patient require 24 hr/day rehab nursing? Yes 4. Does the patient require coordinated care of a physician, rehab nurse, PT, OT, SLP to address physical and functional deficits in the context of the above medical diagnosis(es)? NO Addressing deficits in the following areas: balance, endurance, locomotion, strength,  transferring, bowel/bladder control, bathing, dressing, feeding, speech and swallowing 5. Can the patient actively participate in an intensive therapy program of at least 3 hrs of therapy per day at least 5 days per week? No 6. The potential for patient to make measurable gains while on inpatient rehab is fair 7. Anticipated functional outcomes upon discharge from inpatient rehab are n/a with PT, n/a with OT, n/a with SLP. 8. Estimated rehab length of stay to reach the above functional goals is: n/a 9. Does the patient have adequate social supports to accommodate these discharge functional goals? Potentially 10. Anticipated D/C setting: Home 11. Anticipated post D/C treatments: HH therapy 12. Overall Rehab/Functional Prognosis: excellent  RECOMMENDATIONS: This patient's condition is appropriate for continued rehabilitative care in the following setting: [facility] Patient has agreed to participate in recommended program. Yes Note that insurance prior authorization May be required for reimbursement for recommended care.  Comment: Pt is ambulating and moving in general at a contact guard assist level. Recommend HH services once medically stable for dc.   Ranelle Oyster, MD, [facility] Physical Medicine & Rehabilitation     06/19/2013

## 2013-06-19 NOTE — Progress Notes (Addendum)
NUTRITION FOLLOW UP  Intervention:   Pt declined oral nutrition supplements at this time. Magic cup TID with meals, each supplement provides 290 kcal and 9 grams of protein. If oral intake does not improve, recommend re-attempt at nutrition support. RD to continue to follow nutrition care plan.  Nutrition Dx:   Inadequate oral intake now related to swallowing difficulty AEB limited po intake.  Goal:   Intake to meet >90% of estimated nutrition needs. Unmet.  Monitor:   weight trend, labs, respiratory status, PO intake  Assessment:   Pt admitted on 1/30 s/p emergency adenoidectomy with worsening pain and inability to eat or drink. Pt found to have retropharyngeal abscess with extension into the deep neck. S/P I&D on 1/31.  S/P tracheostomy on 2/4. Currently on trach collar. Pt was unable to lie flat in IR, thus NGT could not be placed. Enteral nutrition orders discontinued this morning.  SLP following for swallowing function, last seen on 2/6. SLP recommends NPO with temporary alternative means for nutrition, despite this, MD ordered Dysphagia 3 diet with thin liquids on 2/8. Per SLP note, pt has eaten some pureed foods with this diet order and is nervous about eating anything more solid. SLP downgraded diet accordingly to Dysphagia 1 with Nectar liquids. Pt does not want any Ensure or Boost. Discussed importance of nutrition.  Potassium is low at 3.6. Magnesium and phosphorus are WNL.  Height: Ht Readings from Last 1 Encounters:  06/15/13 5' (1.524 m)    Weight Status:   Wt Readings from Last 1 Encounters:  06/16/13 233 lb 7.5 oz (105.9 kg)  06/09/13  209 lb (94.802 kg)   Re-estimated needs:  Kcal: [id number]  Protein: 114 gm Fluid: 1.9-2.1 L  Skin: surgical incisions to neck and throat  Diet Order: Dysphagia 1; Nectar Thickened Liquids  No intake or output data in the 24 hours ending 06/19/13 1147  Last BM: 2/7  Labs:   Recent Labs Lab 06/13/13 0400   06/15/13 0800  06/17/13 0525 06/18/13 0520 06/19/13 0522  NA 143  < > 143  < > 144 144 144  K 3.9  < > 3.9  < > 3.5* 3.6* 3.6*  CL 106  < > 104  < > 103 104 107  CO2 27  < > 28  < > 27 24 25   BUN 16  < > 12  < > 17 17 14   CREATININE 0.69  < > 0.57  < > 0.59 0.57 0.54  CALCIUM 7.8*  < > 7.9*  < > 8.1* 8.2* 8.2*  MG 2.2  --  2.2  --   --   --   --   PHOS 4.0  --  3.4  --   --   --   --   GLUCOSE 112*  < > 102*  < > 92 92 94  < > = values in this interval not displayed.  CBG (last 3)   Recent Labs  06/17/13 1648 06/17/13 2008 06/18/13 0018  GLUCAP 106* 85 94    Scheduled Meds: . antiseptic oral rinse  15 mL Mouth Rinse q12n4p  . cefTRIAXone (ROCEPHIN)  IV  2 g Intravenous Q24H  . chlorhexidine  15 mL Mouth Rinse BID  . enoxaparin (LOVENOX) injection  40 mg Subcutaneous Q24H  . lidocaine   Topical Once  . magic mouthwash  5 mL Oral QID    Continuous Infusions: . sodium chloride 20 mL/hr at 06/16/13 2219   Jarold MottoSamantha Kean Gautreau MS, RD,  LDN Pager: [phone] After-hours pager: [phone]

## 2013-06-19 NOTE — Progress Notes (Signed)
Rehab admissions - Evaluated for possible admission.  Please see rehab consult done today by Dr. Riley [facility]Swartz [facility]  [facility].  Call me for questions.  #[phone]

## 2013-06-19 NOTE — Progress Notes (Signed)
Patient ID: Kimberly May, female   DOB: [birth year], 23 y.o.   MRN: [id number]         [facility]    Date of Admission:  06/09/2013   Total days of antibiotics 26        Day 10 cefepime        Postop Day 9         Active Problems:   Retropharyngeal abscess   Acute respiratory failure   Septic shock   ARF (acute renal failure)   Hypokalemia   . antiseptic oral rinse  15 mL Mouth Rinse q12n4p  . cefTRIAXone (ROCEPHIN)  IV  2 g Intravenous Q24H  . chlorhexidine  15 mL Mouth Rinse BID  . enoxaparin (LOVENOX) injection  40 mg Subcutaneous Q24H  . lidocaine   Topical Once  . magic mouthwash  5 mL Oral QID     Objective: Temp:  [98.2 F (36.8 C)-98.4 F (36.9 C)] 98.3 F (36.8 C) (02/09 0516) Pulse Rate:  [69-114] 109 (02/09 1200) Resp:  [17-25] 18 (02/09 1200) BP: (143-149)/(71-93) 149/71 mmHg (02/09 0516) SpO2:  [90 %-94 %] 93 % (02/09 1200) FiO2 (%):  [40 %] 40 % (02/09 1200)  Lab Results Lab Results  Component Value Date   WBC 9.0 06/19/2013   HGB 8.2* 06/19/2013   HCT 25.6* 06/19/2013   MCV 85.6 06/19/2013   PLT 230 06/19/2013    Lab Results  Component Value Date   CREATININE 0.54 06/19/2013   BUN 14 06/19/2013   NA 144 06/19/2013   K 3.6* 06/19/2013   CL 107 06/19/2013   CO2 25 06/19/2013     Assessment: She is showing slow but steady improvement on therapy for 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  Plan: 1. Recommend 5 more days of antibiotic therapy for a total of 2 weeks after last positive operative culture. I would continue ceftriaxone she remains hospitalized. If she is ready for discharge prior to that, she can switch to oral cephalexin 500 mg 4 times daily to complete therapy. 2. I will sign off now but please call if I can be of any further assistance while she is here  Cliffton AstersJohn Maxx Calaway, MD [facility] [phone] pager   [phone] cell 06/19/2013, 1:45  PM

## 2013-06-19 NOTE — Evaluation (Addendum)
Occupational Therapy Evaluation Patient Details Name: Kimberly May MRN: [id number] DOB: [birth year] Today's Date: 06/19/2013 Time: 4782-9562 OT Time Calculation (min): 24 min  OT Assessment / Plan / Recommendation History of present illness Pt admit with VDRF with trach after emergent I&D for retropharyngeal abscess.     Clinical Impression   This 23 yo female admitted with above presents to acute OT with decreased endurance, trached, obesity, all affecting pt's ability to take care of herself. Will benefit from continued acute OT with follow up  HHOT.    OT Assessment  Patient needs continued OT Services    Follow Up Recommendations  Home health OT       Equipment Recommendations  3 in 1 bedside comode       Frequency  Min 2X/week    Precautions / Restrictions Precautions Precautions: Fall Precaution Comments: On trach collar Restrictions Weight Bearing Restrictions: No   Pertinent Vitals/Pain Throat sore from changing out of trach per pt    ADL  Eating/Feeding: Supervision/safety;Set up Where Assessed - Eating/Feeding: Chair Grooming: Set up;Supervision/safety Where Assessed - Grooming: Supported sitting Upper Body Bathing: Set up;Supervision/safety Where Assessed - Upper Body Bathing: Supported sitting Lower Body Bathing: Moderate assistance Where Assessed - Lower Body Bathing: Unsupported sit to stand Upper Body Dressing: Minimal assistance Where Assessed - Upper Body Dressing: Unsupported sitting Lower Body Dressing: Moderate assistance Where Assessed - Lower Body Dressing: Unsupported sit to stand Toilet Transfer: Minimal assistance Toilet Transfer Method: Sit to stand Toilet Transfer Equipment:  (Bed>recliner 2 feet away) Toileting - Clothing Manipulation and Hygiene: Minimal assistance Where Assessed - Engineer, mining and Hygiene: Sit to stand from 3-in-1 or toilet Transfers/Ambulation Related to ADLs: Min A for sit>stand    OT Diagnosis:  Generalized weakness;Acute pain  OT Problem List: Decreased activity tolerance;Impaired balance (sitting and/or standing);Decreased knowledge of use of DME or AE;Pain;Obesity OT Treatment Interventions: Self-care/ADL training;Balance training;DME and/or AE instruction;Therapeutic activities;Patient/family education   OT Goals(Current goals can be found in the care plan section) Acute Rehab OT Goals Patient Stated Goal: to go home OT Goal Formulation: With patient Time For Goal Achievement: 06/26/13 Potential to Achieve Goals: Good  Visit Information  Last OT Received On: 06/19/13 Assistance Needed: +2 (lines/tubes) History of Present Illness: Pt admit with VDRF with trach after emergent I&D for retropharyngeal abscess.         Prior Functioning     Home Living Family/patient expects to be discharged to:: Private residence Living Arrangements: Parent Available Help at Discharge: Family;Available 24 hours/day Type of Home: House Home Access: Ramped entrance;Stairs to enter Entrance Stairs-Number of Steps: 4 Home Layout: One level Home Equipment: None Additional Comments: Pt works as Conservation officer, nature. Dominant Hand: Right         Vision/Perception Vision - History Patient Visual Report: No change from baseline   Cognition  Cognition Arousal/Alertness: Awake/alert Behavior During Therapy: WFL for tasks assessed/performed Overall Cognitive Status: Within Functional Limits for tasks assessed Difficult to assess due to: Tracheostomy    Extremity/Trunk Assessment Upper Extremity Assessment Upper Extremity Assessment: Overall WFL for tasks assessed     Mobility Bed Mobility Overal bed mobility: Modified Independent Bed Mobility: Supine to Sit Supine to sit: HOB elevated Transfers Overall transfer level: Needs assistance Equipment used: None Transfers: Sit to/from Stand Sit to Stand: Min assist Stand pivot transfers: Min assist           End of Session OT - End of  Session Equipment Utilized During Treatment: Oxygen Activity Tolerance:  Patient tolerated treatment well Patient left: in chair;with call bell/phone within reach;with family/visitor present       Evette GeorgesLeonard, Esparanza Krider Eva [phone] 06/19/2013, 4:23 PM

## 2013-06-20 ENCOUNTER — Inpatient Hospital Stay (HOSPITAL_COMMUNITY): Payer: BC Managed Care – PPO

## 2013-06-20 LAB — CBC
HEMATOCRIT: 25.8 % — AB (ref 36.0–46.0)
HEMOGLOBIN: 8.3 g/dL — AB (ref 12.0–15.0)
MCH: 27.4 pg (ref 26.0–34.0)
MCHC: 32.2 g/dL (ref 30.0–36.0)
MCV: 85.1 fL (ref 78.0–100.0)
Platelets: 224 10*3/uL (ref 150–400)
RBC: 3.03 MIL/uL — ABNORMAL LOW (ref 3.87–5.11)
RDW: 14.2 % (ref 11.5–15.5)
WBC: 7.2 10*3/uL (ref 4.0–10.5)

## 2013-06-20 LAB — GLUCOSE, CAPILLARY: Glucose-Capillary: 85 mg/dL (ref 70–99)

## 2013-06-20 LAB — BASIC METABOLIC PANEL
BUN: 12 mg/dL (ref 6–23)
CHLORIDE: 106 meq/L (ref 96–112)
CO2: 24 meq/L (ref 19–32)
CREATININE: 0.51 mg/dL (ref 0.50–1.10)
Calcium: 8.1 mg/dL — ABNORMAL LOW (ref 8.4–10.5)
GFR calc Af Amer: >90 mL/min (ref >90)
GFR calc non Af Amer: >90 mL/min (ref >90)
Glucose, Bld: 89 mg/dL (ref 70–99)
Potassium: 3.6 mEq/L — ABNORMAL LOW (ref 3.7–5.3)
Sodium: 143 mEq/L (ref 137–147)

## 2013-06-20 MED ORDER — STARCH (THICKENING) PO POWD
ORAL | Status: DC | PRN
  Filled 2013-06-20 (×2): qty 227

## 2013-06-20 NOTE — Progress Notes (Signed)
Speech Language Pathology Treatment: Dysphagia;Passy Muir Speaking valve  Patient Details Name: Kimberly May MRN: [id number] DOB: [birth year] Today's Date: 06/20/2013 Time: 1240-1320 SLP Time Calculation (min): 40 min  Assessment / Plan / Recommendation Clinical Impression  Pt seen for PMSV and dysphagia treatment.  Pt trach downsized today to #4 cuffless.  Significant improvement in PMV tolerance today, with valve in place for 35 minutes (3-5 seconds yesterday).  Pt then reported she wanted to rest for awhile, and replace valve when her parents came to visit.  [facility] change to cuffless trach, and pt ability to wear PMV during waking hours (removing during breathing treatments and when sleeping).  Pt was also observed with puree consistency.  No apparent difficulty observed or reported.  Pt follows safe swallow precautions with min assist, with cues needed only for swallowing 2x.  Goals updated.  ST to continue to follow.  PMV removed, RN notified.     HPI HPI: 23 years old female with no significant PMH. Underwent I&D of retropharyngeal abscess and possible early mediastinitis 06/09/13. 2/2 s/p interval incision and drainage of suppurative left lateral retropharyngeal lymph node. Persistent edema and phlegm throughout the retropharyngeal space without evidence of new abscess. 2/4 re-exploration of wound with trach placement. Discussed patient with Dr. Lazarus SalinesWolicki who reported non-patent upper airway with trach cuff deflation. 2/9 pt underwent MBS to reevaluate swallow function and safety.  2/10 pt had trach changed to #4 cuffless trach.  Still tender per pt report, but willing to try PMSV placement.     Pertinent Vitals VSS  SLP Plan  Goals updated    Recommendations Diet recommendations: Dysphagia 1 (puree);Nectar-thick liquid Liquids provided via: Cup Medication Administration: Crushed with puree Supervision: Patient able to self feed;Full supervision/cueing for  compensatory strategies Compensations: Slow rate;Small sips/bites;Multiple dry swallows after each bite/sip Postural Changes and/or Swallow Maneuvers: Seated upright 90 degrees;Upright 30-60 min after meal      Patient may use [name] Speech Valve: During all waking hours (remove during sleep) PMSV Supervision: Intermittent       Oral Care Recommendations: Oral care BID Follow up Recommendations: Home health SLP Plan: Goals updated    GO    Demetres Prochnow B. Murvin NatalBueche, [facility]MSP, CCC-SLP [phone]  Leigh AuroraBueche, Arnoldo Hildreth Brown 06/20/2013, 1:22 PM

## 2013-06-20 NOTE — Progress Notes (Deleted)
Respiratory therapy note- Patient does not want trach care done at this time. MD will be downsizing  To a 4 cfs today. Will continue to monitor.

## 2013-06-20 NOTE — Progress Notes (Signed)
RT placed PMV on patient at this time so that she could speak with her visitors. She appears to be doing very well with it. No SOB, SAT 98%. Showed Mother how to help her remove it if need be. RT will continue to monitor.

## 2013-06-20 NOTE — Progress Notes (Signed)
PT Cancellation Note  Patient Details Name: Kimberly May MRN: [id number] DOB: 01/09/1991   Cancelled Treatment:    Reason Eval/Treat Not Completed: Fatigue/lethargy limiting ability to participate. Patient stated she did not want to attempt ambulation this afternoon. [facility] and [facility]. Will check on patient again tomorrow morning.    Fredrich BirksRobinette, Julia Elizabeth 06/20/2013, 1:39 PM

## 2013-06-20 NOTE — Progress Notes (Signed)
06/20/2013 9:07 AM  Pyeatt, Swaz[place] [id number]  Post-Op Day 10/6    Temp:  [98.2 F (36.8 C)-99.2 F (37.3 C)] 98.2 F (36.8 C) (02/10 0600) Pulse Rate:  [63-116] 63 (02/10 0824) Resp:  [17-20] 18 (02/10 0824) BP: (133-156)/(79-82) 141/79 mmHg (02/10 0600) SpO2:  [91 %-95 %] 95 % (02/10 0824) FiO2 (%):  [40 %] 40 % (02/10 0824),     Intake/Output Summary (Last 24 hours) at 06/20/13 0907 Last data filed at 06/20/13 0542  Gross per 24 hour  Intake    724 ml  Output    300 ml  Net    424 ml    Results for orders placed during the hospital encounter of 06/09/13 (from the past 24 hour(s))  BASIC METABOLIC PANEL     Status: Abnormal   Collection Time    06/20/13  5:40 AM      Result Value Range   Sodium 143  137 - 147 mEq/L   Potassium 3.6 (*) 3.7 - 5.3 mEq/L   Chloride 106  96 - 112 mEq/L   CO2 24  19 - 32 mEq/L   Glucose, Bld 89  70 - 99 mg/dL   BUN 12  6 - 23 mg/dL   Creatinine, Ser 8.650.51  0.50 - 1.10 mg/dL   Calcium 8.1 (*) 8.4 - 10.5 mg/dL   GFR calc non Af Amer >90  >90 mL/min   GFR calc Af Amer >90  >90 mL/min  CBC     Status: Abnormal   Collection Time    06/20/13  5:40 AM      Result Value Range   WBC 7.2  4.0 - 10.5 K/uL   RBC 3.03 (*) 3.87 - 5.11 MIL/uL   Hemoglobin 8.3 (*) 12.0 - 15.0 g/dL   HCT 78.425.8 (*) 69.636.0 - 29.546.0 %   MCV 85.1  78.0 - 100.0 fL   MCH 27.4  26.0 - 34.0 pg   MCHC 32.2  30.0 - 36.0 g/dL   RDW 28.414.2  13.211.5 - 44.015.5 %   Platelets 224  150 - 400 K/uL  GLUCOSE, CAPILLARY     Status: None   Collection Time    06/20/13  7:36 AM      Result Value Range   Glucose-Capillary 85  70 - 99 mg/dL   Lower drain removed.  Trach downsized to #4 cuffless without difficulty.  Pt continues to desaturate rapidly without 40 % FIO2 per trach mask.  Mod Ba swallow yest still shows pharyngeal phase dysphagia with tendency to aspirate liquids.  Dysphagia I diet ordered with nectar thick liquids  SUBJECTIVE:  Swallowing solids and liquids.  Less pain.  Ambulating  in halls.  OBJECTIVE:  Less neck drainage.  Lower drain removed, upper drain advanced.  IMPRESSION:  Slow progress.  I anticipate swallowing and speaking will be easier with the smaller trach tube.  Still easy O2 desaturation  PLAN:  Diet as ordered.  PMV trials to continue.  CXR. Cont IV Rocephin. Wean O2 when possible.  Home Health referral and home care teaching for trach.     Flo ShanksWOLICKI, Farid Grigorian

## 2013-06-21 NOTE — Progress Notes (Signed)
Physical Therapy Treatment Patient Details Name: Kimberly May MRN: [id number] DOB: November 30, 1990 Today's Date: 06/21/2013 Time: 2229-7989 PT Time Calculation (min): 15 min  PT Assessment / Plan / Recommendation  History of Present Illness Pt admit with VDRF with trach after emergent I&D for retropharyngeal abscess.     PT Comments   Patient progressed and has met all PT goals. Will signoff on acute PT at this. No home health PT needed. Recommend use of RW for longer distance.   Follow Up Recommendations  No PT follow up     Does the patient have the potential to tolerate intense rehabilitation     Barriers to Discharge        Equipment Recommendations  Rolling walker with 5" wheels    Recommendations for Other Services    Frequency     Progress towards PT Goals Progress towards PT goals: Goals met/education completed, patient discharged from PT  Plan Discharge plan needs to be updated    Precautions / Restrictions Precautions Precautions: Fall Precaution Comments: On trach collar   Pertinent Vitals/Pain Denied pain   Mobility  Bed Mobility Overal bed mobility: Modified Independent General bed mobility comments: states she has remained in recliner secondary to it is more comfortable than the bed Transfers Overall transfer level: Modified independent Transfers: Sit to/from [employer] Transfers Sit to Stand: Supervision;Modified independent (Device/Increase time) Stand pivot transfers: Supervision;Modified independent (Device/Increase time) General transfer comment: able to transfer to/from recliner to bsc Ambulation/Gait Ambulation/Gait assistance: Modified independent (Device/Increase time) Ambulation Distance (Feet): 400 Feet Assistive device: Rolling walker (2 wheeled) Gait Pattern/deviations: WFL(Within Functional Limits) Gait velocity interpretation: at or above normal speed for age/gender    Exercises     PT Diagnosis:    PT Problem List:   PT  Treatment Interventions:     PT Goals (current goals can now be found in the care plan section)    Visit Information  Last PT Received On: 06/21/13 Assistance Needed: +1 History of Present Illness: Pt admit with VDRF with trach after emergent I&D for retropharyngeal abscess.      Subjective Data      Cognition  Cognition Arousal/Alertness: Awake/alert Behavior During Therapy: WFL for tasks assessed/performed Overall Cognitive Status: Within Functional Limits for tasks assessed Difficult to assess due to: Tracheostomy    Balance     End of Session PT - End of Session Equipment Utilized During Treatment: Gait belt;Oxygen Activity Tolerance: Patient tolerated treatment well Patient left: in chair;with call bell/phone within reach;with family/visitor present Nurse Communication: Mobility status   GP     Jacqualyn Posey 06/21/2013, 12:03 PM  06/21/2013 Jacqualyn Posey PTA [phone] pager [phone] office

## 2013-06-21 NOTE — Care Management Note (Signed)
06-21-13 Kimberly May will be discharging home with trach .  Spoke with Kimberly May and her father Kimberly May ( cell [phone]) .   Confirmed face sheet information with father.   Kimberly May is willing to be taught trach care . Spoke to BucyrusMichelle in respiratory , she will have RT call Kimberly May directly to schedule teaching .  Kimberly May aware of same .   Kimberly May and Kimberly May would like [facility] for home health . Referral given .  Education check sheet for [facility] in shadow chart .   Ronny FlurryHeather Franky Reier RN BSN [phone]

## 2013-06-21 NOTE — Progress Notes (Signed)
Spoke with  pt's dad, Fredrik CoveRoger- scheduled appt for trach education tomorrow 06/22/13 @ 1500.

## 2013-06-21 NOTE — Progress Notes (Signed)
Speech Language Pathology Treatment: Kimberly May Kimberly May Speaking valve;Dysphagia  Patient Details Name: Kimberly May MRN: [id number] DOB: June 07, 1990 Today's Date: 06/21/2013 Time: 1350-1420 SLP Time Calculation (min): 30 min  Assessment / Plan / Recommendation Clinical Impression  Pt. Seen for dysphagia and PMSV treatment sitting in chair.  Received page that PMSV was missing and SLP brought a replacement.  Pt. exhibited a mildly hoarse vocal quality, with PMV and fair-good intensity with frequent coughing initially likely due to deep suctioning (as SLP arrived).  Pt.'s HR, RR, SpO2 were within defined limits.  She required min-mild visual/verbal cueing to demonstrate donn and doff valve using mirror with repeated trials.  She demonstrated safe swallowing with nectar thick liquids.  Delayed cough x 1 with trial of Dys 2 texture and functional oral prep, mastication and transit , however pt. desired to remain on Dys 1 at present time. SLP will continue to treat for independent use with speaking valve and safety/upgrade textures when able/appropriate.   HPI HPI: 23 years old female with no significant PMH. Underwent I&D of retropharyngeal abscess and possible early mediastinitis 06/09/13. 2/2 s/p interval incision and drainage of suppurative left lateral retropharyngeal lymph node. Persistent edema and phlegm throughout the retropharyngeal space without evidence of new abscess. 2/4 re-exploration of wound with trach placement. Discussed patient with Dr. Lazarus SalinesWolicki who reported non-patent upper airway with trach cuff deflation. 2/9 pt underwent MBS to reevaluate swallow function and safety.  2/10 pt had trach changed to #4 cuffless trach.  Still tender per pt report, but willing to try PMSV placement.     Pertinent Vitals WDL  SLP Plan  Continue with current plan of care    Recommendations Diet recommendations: Dysphagia 1 (puree);Nectar-thick liquid Liquids provided via: Cup;No straw Medication  Administration: Whole meds with puree Supervision: Patient able to self feed;Intermittent supervision to cue for compensatory strategies Compensations: Slow rate;Small sips/bites;Multiple dry swallows after each bite/sip Postural Changes and/or Swallow Maneuvers: Seated upright 90 degrees;Upright 30-60 min after meal      Patient may use Passy-Kimberly May Speech Valve: During all waking hours (remove during sleep) PMSV Supervision: Intermittent       Oral Care Recommendations: Oral care BID Follow up Recommendations: Home health SLP Plan: Continue with current plan of care    GO     Kimberly MacadamiaLisa May Kimberly May M.Ed [employer]CCC-SLP Pager [phone]  06/21/2013

## 2013-06-21 NOTE — Progress Notes (Signed)
0200 Respiratory therapist had decrease O2 to 35% trach collar. Patient O2 sat now is 88%. Oxygen increase back to 40% sats now ranging between 92% to 95%. Will continue to monitor.

## 2013-06-21 NOTE — Progress Notes (Addendum)
Occupational Therapy Treatment Patient Details Name: Kimberly May MRN: [id number] DOB: 1990-06-01 Today's Date: 06/21/2013 Time: 1000-1010 OT Time Calculation (min): 10 min  OT Assessment / Plan / Recommendation  History of present illness Pt admit with VDRF with trach after emergent I&D for retropharyngeal abscess.     OT comments  Pt. Performing excellent today.  Able to complete all aspects of toileting with s/mod I.  Lb selfcare also with s/mod. I.  Pt. Has large amount of family support, a  Lot of them present today and report they will be available at home also.  Rec. D/c from acute o.t. Secondary to pt. Has achieved acute level goals and is doing great!  Follow Up Recommendations  Home health OT           Equipment Recommendations  3 in 1 bedside comode        Frequency Min 2X/week   Progress towards OT Goals Progress towards OT goals: Progressing toward goals  Plan      Precautions / Restrictions Precautions Precautions: Fall Precaution Comments: On trach collar   Pertinent Vitals/Pain Pt. Mouthed "i'm fine" and gave two thumbs up when asked about pain    ADL  Lower Body Dressing: Performed;Set up Where Assessed - Lower Body Dressing: Supported sitting Toilet Transfer: Performed;Supervision/safety Toilet Transfer Method: Stand pivot;Sit to Barist[place]and Toilet Transfer Equipment: Materials engineerBedside commode Toileting - Clothing Manipulation and Hygiene: Simulated;Supervision/safety Where Assessed - Engineer, miningToileting Clothing Manipulation and Hygiene: Sit to stand from 3-in-1 or toilet Transfers/Ambulation Related to ADLs: s for sit/stand and pivotal transfers ADL Comments: able to perform lb dressing with s seated, reports toileting on her own with use of bsc. able to safely demonstrate stand pivot to bsc.  has a lot of family support that were present and will con't. to be present following d/c     OT Goals(current goals can now be found in the care plan section)    Visit Information  Last OT Received On: 06/21/13 History of Present Illness: Pt admit with VDRF with trach after emergent I&D for retropharyngeal abscess.                  Cognition  Cognition Arousal/Alertness: Awake/alert Behavior During Therapy: WFL for tasks assessed/performed Overall Cognitive Status: Within Functional Limits for tasks assessed Difficult to assess due to: Tracheostomy    Mobility  Bed Mobility General bed mobility comments: states she has remained in recliner secondary to it is more comfortable than the bed Transfers Overall transfer level: Modified independent Transfers: Sit to/from BJ'sStand;Stand Pivot Transfers Sit to Stand: Supervision;Modified independent (Device/Increase time) Stand pivot transfers: Supervision;Modified independent (Device/Increase time) General transfer comment: able to transfer to/from recliner to bsc              End of Session OT - End of Session Equipment Utilized During Treatment: Oxygen Activity Tolerance: Patient tolerated treatment well Patient left: in chair;with call bell/phone within reach;with family/visitor present       Robet LeuMorris, Kimberly May, COTA/L 06/21/2013, 11:08 AM

## 2013-06-21 NOTE — Progress Notes (Signed)
06/21/2013 8:34 PM  May, Kimberly [id number]  Post-Op Day 11/7    Temp:  [97.6 F (36.4 C)-98.6 F (37 C)] 98.4 F (36.9 C) (02/11 1900) Pulse Rate:  [55-88] 84 (02/11 1900) Resp:  [16-20] 18 (02/11 1900) BP: (138-151)/(77-96) 142/84 mmHg (02/11 1900) SpO2:  [93 %-100 %] 100 % (02/11 1900) FiO2 (%):  [25 %-40 %] 35 % (02/11 1900),     Intake/Output Summary (Last 24 hours) at 06/21/13 2034 Last data filed at 06/21/13 0959  Gross per 24 hour  Intake 534.66 ml  Output      0 ml  Net 534.66 ml  CXR (10 FEB) with some vascular congestion.  No results found for this or any previous visit (from the past 24 hour(s)).  SUBJECTIVE:  Less neck/throat pain. Taking po's well.  Ambulating in halls. Using [facility] Mu[facility] valve essentially continuously.  OBJECTIVE:  Min neck drainage.  Drain removed.  Able to speak with PMV. Airway not great with trach finger occluded.  Still O2 desats and remains on 40 % FIO2.  IMPRESSION:  Satisfactory improvement.    PLAN:  Due for 2 more days IV Rocephin.  Will go home with trach for 1 more week.  Will ask Dr. Fannie Kneelint Young about need for O2 at home.  Continue to advance diet and activity.   Flo ShanksWOLICKI, Kimberly May

## 2013-06-22 DIAGNOSIS — Z930 Tracheostomy status: Secondary | ICD-10-CM

## 2013-06-22 DIAGNOSIS — R0902 Hypoxemia: Secondary | ICD-10-CM

## 2013-06-22 NOTE — Progress Notes (Signed)
Trach education done at this time with pt's mom & dad who are present at bedside. Mom demonstrated good understanding, she was able to suction & complete trach care. Dad seems to be nervous- he did not want to be hands on with suctioning or trach care today. [facility] this [facility]. Will follow back up with pt and family tomorrow to continue trach education.

## 2013-06-22 NOTE — Progress Notes (Signed)
06/22/2013 10:38 AM  May, Kimberly [id number]  Post-Op Day 12/8    Temp:  [97.7 F (36.5 C)-98.4 F (36.9 C)] 97.7 F (36.5 C) (02/12 0539) Pulse Rate:  [62-101] 101 (02/12 0847) Resp:  [18-20] 20 (02/12 0847) BP: (131-151)/(69-96) 131/69 mmHg (02/12 0539) SpO2:  [94 %-100 %] 97 % (02/12 0847) FiO2 (%):  [28 %-35 %] 28 % (02/12 0847),     Intake/Output Summary (Last 24 hours) at 06/22/13 1038 Last data filed at 06/22/13 1014  Gross per 24 hour  Intake    490 ml  Output      0 ml  Net    490 ml    No results found for this or any previous visit (from the past 24 hour(s)).  SUBJECTIVE:  Swallowing easier. Not yet able to breathe much with trach finger occluded.  OBJECTIVE:  Min LEFT neck drainage.  Voice clearer with PMV.  Poor air movement with trach occluded.  IMPRESSION:  Satisfactory check  PLAN:  Home care planning.  Family trach care teaching.  Will ask [facility] about using a disposable cannula cuffless trach tube.  [facility] pulmonary will weigh in on need for home O2.  Anticipate last dose of abx tomorrow.  Will probably need trach 1-2 more weeks.  Flo ShanksWOLICKI, Vaishali Baise

## 2013-06-22 NOTE — Progress Notes (Signed)
Patient ID: Kimberly T Schepp, female   DOB: 1991/01/20, 23 y.o.   MRN: [id number]  Name: Kimberly May MRN: [id number] DOB: 1991/01/20    ADMISSION DATE:  06/09/2013 CONSULTATION DATE: 06/10/13   REFERRING MD : Flo ShanksKarol Wolicki  PRIMARY SERVICE: ENT   CHIEF COMPLAINT: Post I&D of retropharyngeal abscess. Intubated  BRIEF PATIENT DESCRIPTION: 23 years old female with no significant PMH. Underwent I&D of retropharyngeal abscess and possible early mediastinitis. Out of the OR intubated. PCCM consult called for mechanical ventilation management.   SIGNIFICANT EVENTS / STUDIES:  1/30 -> I&D retropharyngeal abscess  2/2 ct neck>>>Interval incision and drainage of suppurated left lateral retropharyngeal lymph node. Persistent edema and phlegmon throughout the retropharyngeal space without definite evidence of new abscess. 2/4 --> re-exploration of wound and trach placement 2/5 trach collar 2/6 ECHO- EF 60-65%, PAS 39 2/8 to floor 2/9 PCCM signed off 2/12 Called back to advise on home O2 and Pulmonary follow up.   LINES / TUBES:  PICC 1/31 >> 1-31 OTT>>2/3 2/3 trach  2/4 trach collar  CULTURES:  - FEW GROUP A STREP (S.PYOGENES) ISOLATED FROM PRIOR I&D ON 1/25  1/31 ana>> Neg 1/31 wound>> Moderate strep pyogenes  ANTIBIOTICS:  1/30 Rocephin>>  -1/30 Flagyl>> 2/2 1/31 cleocin>> 2/2  SUBJECTIVE: AWAKE AND ALERT NAD AT rest  VITAL SIGNS: Temp:  [97.7 F (36.5 C)-98.4 F (36.9 C)] 97.7 F (36.5 C) (02/12 0539) Pulse Rate:  [62-101] 101 (02/12 0847) Resp:  [18-20] 20 (02/12 0847) BP: (131-151)/(69-96) 131/69 mmHg (02/12 0539) SpO2:  [94 %-100 %] 97 % (02/12 0847) FiO2 (%):  [28 %-35 %] 28 % (02/12 0847) INTAKE / OUTPUT: Intake/Output     02/11 0701 - 02/12 0700 02/12 0701 - 02/13 0700   P.O. 120 240   I.V. (mL/kg) 200 (1.9)    IV Piggyback 50    Total Intake(mL/kg) 370 (3.5) 240 (2.3)   Net +370 +240        Urine Occurrence 4 x      PHYSICAL EXAMINATION: General: awake,  no distress, . Moves slowly with assistance. Morbidly obese Neuro: nonfocal  HEENT: wound dressed, swollen, mild conjunctival hemorrhages. Trach collar. No stridor PULM: CTA  93% on 28% collar CV: s1 s2 RRR  GI: soft, bs hypoactive , no r  Extremities: mild edema   LABS:  CBC  Recent Labs Lab 06/18/13 0520 06/19/13 0522 06/20/13 0540  WBC 11.8* 9.0 7.2  HGB 8.4* 8.2* 8.3*  HCT 26.0* 25.6* 25.8*  PLT 253 230 224   Coag's No results found for this basename: APTT, INR,  in the last 168 hours BMET  Recent Labs Lab 06/18/13 0520 06/19/13 0522 06/20/13 0540  NA 144 144 143  K 3.6* 3.6* 3.6*  CL 104 107 106  CO2 24 25 24   BUN 17 14 12   CREATININE 0.57 0.54 0.51  GLUCOSE 92 94 89   Electrolytes  Recent Labs Lab 06/18/13 0520 06/19/13 0522 06/20/13 0540  CALCIUM 8.2* 8.2* 8.1*   Sepsis Markers No results found for this basename: LATICACIDVEN, PROCALCITON, O2SATVEN,  in the last 168 hours Glucose  Recent Labs Lab 06/17/13 0737 06/17/13 1248 06/17/13 1648 06/17/13 2008 06/18/13 0018 06/20/13 0736  GLUCAP 99 105* 106* 85 94 85    Imaging No results found.  ASSESSMENT / PLAN:    A: Acute respiratory failure 2nd to retropharygneal abscess s/p I&D, pleural effusion, pulmonary edema.  S/p tracheostomy.   P:  -trach collar as tolerated , will need  home O2 while on trach. -post-op, trach care per ENT -bronchial hygiene -day of Abx 13, currently on rocephin per ENT, complete prior to dc -Follow up with pulmonary as outpatient   A: Dysphagia. P: -D3 diet  A: Sepsis >> resolved. P: -monitor hemodynamics  A: Anemia of critical illness; also component of hemodiluation. P: F/u CBC   Summary:  Trach collar tolerated . Up to chair . Eating dysphagia diet.    Arrange home O2 and pulmonary follow up.   Brett Canales Minor ACNP Adolph Pollack PCCM Pager [phone] till 3 pm If no answer page [phone] 06/22/2013, 1:23 PM                 Continue TC, O2 for home  arranged and f/u with PCCM in the office arranged.  PCCM will sign off, please cal back if needed.  Patient seen and examined, agree with above note.  I dictated the care and orders written for this patient under my direction.  Alyson Reedy, MD [phone]

## 2013-06-23 DIAGNOSIS — Z930 Tracheostomy status: Secondary | ICD-10-CM

## 2013-06-23 DIAGNOSIS — R0902 Hypoxemia: Secondary | ICD-10-CM

## 2013-06-23 NOTE — Progress Notes (Signed)
06/23/2013 8:50 AM  May, Kimberly [id number]  Post-Op Day 13/9    Temp:  [97.8 F (36.6 C)-98.3 F (36.8 C)] 98 F (36.7 C) (02/13 0539) Pulse Rate:  [67-96] 96 (02/13 0831) Resp:  [16-18] 18 (02/13 0831) BP: (135-143)/(70-82) 136/77 mmHg (02/13 0539) SpO2:  [93 %-99 %] 96 % (02/13 0831) FiO2 (%):  [28 %] 28 % (02/13 0831),     Intake/Output Summary (Last 24 hours) at 06/23/13 0850 Last data filed at 06/23/13 0500  Gross per 24 hour  Intake    730 ml  Output      0 ml  Net    730 ml    No results found for this or any previous visit (from the past 24 hour(s)).  SUBJECTIVE:  Less discomfort.  Pt and Mom doing well with trach care.  Dad squeamish.    OBJECTIVE:  Much less neck drainage.  Voice good.  Airway still limited with trach finger occluded.  IMPRESSION:  Satisfactory check  PLAN:  More home care teaching needed.  Home Health cannot admit pt on weekend.  Anticipate discharge Monday.  Will continue Rocephin until Sunday PM dose.  PICC line out Monday AM.  Pulmonary suggests home O2 therapy and outpatient f/u.    Flo ShanksWOLICKI, Payge Eppes

## 2013-06-23 NOTE — Progress Notes (Signed)
06-23-13 Loren from respiratory called . She had plans to meet with family this afternoon for teaching , however, the patient's uncle has past away.  Family stated they could come in tomorrow. Loren instructed them to ask for the RT when the arrive.   Bedside nurse aware and will past on in report .   CC also aware and will be here tomorrow.   Education flow sheet in shadow chart .   [facility] aware.   Ronny FlurryHeather Devera Englander RN BSN [phone]

## 2013-06-24 MED ORDER — HYDROCODONE-ACETAMINOPHEN 5-325 MG PO TABS
1.0000 | ORAL_TABLET | Freq: Four times a day (QID) | ORAL | Status: DC | PRN
  Administered 2013-06-25 (×2): 2 via ORAL
  Administered 2013-06-25: 1 via ORAL
  Administered 2013-06-25 – 2013-06-26 (×2): 2 via ORAL
  Filled 2013-06-24: qty 2
  Filled 2013-06-24: qty 1
  Filled 2013-06-24 (×4): qty 2

## 2013-06-24 MED ORDER — KETOROLAC TROMETHAMINE 10 MG PO TABS
10.0000 mg | ORAL_TABLET | Freq: Four times a day (QID) | ORAL | Status: DC | PRN
  Administered 2013-06-24 – 2013-06-26 (×4): 10 mg via ORAL
  Filled 2013-06-24 (×4): qty 1

## 2013-06-24 NOTE — Progress Notes (Signed)
   ENT Progress Note: POD #14/10 s/p Procedure(s): TRACHEOSTOMY DIRECT LARYNGOSCOPY/RE-EXPLORATORY OF NECK   Subjective: Stable, no complaints  Objective: Vital signs in last 24 hours: Temp:  [97.9 F (36.6 C)-98.3 F (36.8 C)] 97.9 F (36.6 C) (02/14 0533) Pulse Rate:  [66-96] 76 (02/14 0533) Resp:  [16-20] 20 (02/14 0533) BP: (128-140)/(65-81) 128/65 mmHg (02/14 0533) SpO2:  [92 %-98 %] 95 % (02/14 0533) FiO2 (%):  [28 %] 28 % (02/14 0533) Weight change:  Last BM Date: 06/22/13  Intake/Output from previous day: 02/13 0701 - 02/14 0700 In: 550 [I.V.:500; IV Piggyback:50] Out: -  Intake/Output this shift:    Labs:   Studies/Results: No results found.   PHYSICAL EXAM: Trach in place  Airway stable   Assessment/Plan: Cont trach care and teaching Plan d/c home on 2/16.    Boen Sterbenz 06/24/2013, 9:34 AM

## 2013-06-25 NOTE — Progress Notes (Signed)
Pt's trach plugged per MD order.  Pt placed on 2l Halfway.  Pt is tolerating well, RN made aware.  Sats 99%, RR 18, HR 78.  RT will continue to monitor.

## 2013-06-25 NOTE — Progress Notes (Signed)
   ENT Progress Note: POD #15/11 s/p Procedure(s): TRACHEOSTOMY DIRECT LARYNGOSCOPY/RE-EXPLORATORY OF NECK   Subjective: C/O pm headache  Objective: Vital signs in last 24 hours: Temp:  [98.1 F (36.7 C)-98.4 F (36.9 C)] 98.4 F (36.9 C) (02/15 0639) Pulse Rate:  [64-94] 88 (02/15 0639) Resp:  [18-20] 20 (02/15 0639) BP: (121-130)/(69-78) 130/69 mmHg (02/15 0639) SpO2:  [95 %-100 %] 100 % (02/15 0639) FiO2 (%):  [28 %] 28 % (02/15 0639) Weight change:  Last BM Date: 06/24/13  Intake/Output from previous day: 02/14 0701 - 02/15 0700 In: 240 [P.O.:240] Out: 600 [Urine:600] Intake/Output this shift:    Labs: No results found for this basename: WBC, HGB, HCT, PLT,  in the last 72 hours No results found for this basename: NA, K, CL, CO2, GLUCOSE, BUN, CREATININR, CALCIUM,  in the last 72 hours  Studies/Results: No results found.   PHYSICAL EXAM: Stoma intact PMV in place with good voice   Assessment/Plan: Changed pt form IV MSO4 to Toradol and prn vicodan, this may improve H/A sx. Cont trach teaching, begin trach capping if tolerated Plan d/c 2/16.    Raahim Shartzer 06/25/2013, 9:12 AM

## 2013-06-25 NOTE — Progress Notes (Signed)
Trach education continued w/ patient and mother/father.  All questions answered, per family and pt, they feel comfortable w/ taking care of trach when they are d/c.

## 2013-06-26 MED ORDER — RESOURCE THICKENUP CLEAR PO POWD
ORAL | Status: DC | PRN
  Filled 2013-06-26: qty 125

## 2013-06-26 MED ORDER — HYDROCODONE-ACETAMINOPHEN 5-325 MG PO TABS: 1.0000 | ORAL_TABLET | ORAL | Status: DC | PRN

## 2013-06-26 NOTE — Progress Notes (Addendum)
Discharge instructions were given and went over with patient and her family. Patient was also given perscription for hydocodone to take to her pharmacy. Patient stated that she did not have any questions. Volunteer has come to take her to transportation.

## 2013-06-26 NOTE — Progress Notes (Signed)
SATURATION QUALIFICATIONS: (This note is used to comply with regulatory documentation for home oxygen)  Patient Saturations on Room Air at Rest = 97%  Patient Saturations on Room Air while Ambulating = 91%  Patient Saturations on 1.5 Liters of oxygen while Ambulating = 98%  Please briefly explain why patient needs home oxygen:

## 2013-06-26 NOTE — Progress Notes (Addendum)
SATURATION QUALIFICATIONS: (This note is used to comply with regulatory documentation for home oxygen)  Patient Saturations on Room Air at Rest = 96%  Patient Saturations on Room Air while Ambulating =  89-91%  Patient Saturations on 2 Liters of oxygen while Ambulating = 98 %  Please briefly explain why patient needs home oxygen:  When pt is up walking her oxygen saturation drops below 92%. If she walks long distances she will probably need some oxygen.

## 2013-06-26 NOTE — Progress Notes (Signed)
Agree with PTA.    Cricket Goodlin, PT [phone]  

## 2013-06-26 NOTE — Discharge Instructions (Signed)
Advance activity and diet as tolerated.   OK to shower with neck dressings off, then replace wound dressing and trach dressing at least daily. Put finger pressure over the trach wound until it is sealed when you cough or speak. OK to stop using dressings when no more drainage occurs. Vicodin for pain if needed. Call for problems or question, and for appointment 1 week, [phone].

## 2013-06-26 NOTE — Discharge Summary (Signed)
06/26/2013 9:45 AM  May, Kimberly [id number]  Post-Op Day 16/12    Temp:  [97.9 F (36.6 C)-98.6 F (37 C)] 97.9 F (36.6 C) (02/16 0640) Pulse Rate:  [62-102] 102 (02/16 0640) Resp:  [16-18] 16 (02/16 0640) BP: (131-141)/(70-84) 141/74 mmHg (02/16 0640) SpO2:  [99 %-100 %] 99 % (02/16 0640),     Intake/Output Summary (Last 24 hours) at 06/26/13 0945 Last data filed at 06/26/13 0856  Gross per 24 hour  Intake   1434 ml  Output      0 ml  Net   1434 ml    No results found for this or any previous visit (from the past 24 hour(s)).  SUBJECTIVE:  Really no pain unless manipulating trach.  Trach plugged continuously x 24 hrs with no difficulty.  Eating solids, full activitiy.  OBJECTIVE:  Still some wound drainage.  Voice and respirations clear and unlabored.  Trach removed without difficulty.  Dressing applied  IMPRESSION:  Satisfactory check, with trach decannulation  PLAN:  Discharge home  Admit:  31 JAN 15 Discharge: 16 FEB 15 Final Diagnosis:  Retropharyngeal abscess.  Deep neck infection.  Mediastinitis.   Procedure:  I&D LEFT neck, 31 JAN Flexible laryngoscopy 3 FEB Direct Laryngoscopy, Trachestomy, LEFT neck exploration, 4 FEB Flexible Laryngoscopy 9 FEB Comp:  None Cond:  Ambulatory, taking po solids.  Pain controlled. Drains and trach removed.   Recheck:  1 wk Dr. Lazarus SalinesWolicki Rx:  Vicodin Instructions written and given  Hospital Course: 2 Weeks prior to admission the patient was diagnosed with strep throat and given Zithromax. 10 days later, she had progressive worsening of her sore throat and was evaluated in the [facility] emergency room with presumed retropharyngeal abscess. Dr. Emeline DarlingGore performed adenoidectomy and needle aspiration of the retropharyngeal space with no frank pus identified. Cultures grew group A strep. She was sent home with clindamycin and prednisone.  5 days later she came in with rapidly progressive painful swallowing, subjective difficulty  breathing, neck stiffness and very tender swelling and fever. CT scan at this point showed a frank abscess in the left retropharynx with fluid tracking in the prevertebral space and bilateral pleural effusions. No gas formation.  An emergent left neck exploration and evacuation of retropharyngeal abscess was performed. Again cultures grew group A strep. She initially had clindamycin, Rocephin, and Flagyl. When the culture came back she was narrowed to Rocephin only.  She had consultations with infectious disease, critical care medicine, pulmonary medicine, speech pathology, physical therapy, and nutrition.  Given significant swelling in the pharynx, she was left intubated after the surgery and managed in the intensive care unit. She had slow improvement in fever, and white blood cell count. She had anemia, possibly dilutional. She failed lead testing although she had relatively rapid progressive in weaning trials. On postoperative day 3, flexible laryngoscopy was attempted with limited visualization secondary to orotracheal and orogastric intubation. A CT scan was performed which still showed large amounts of neck edema but no frank loculations. It was felt indicated to perform a tracheostomy, direct laryngoscopy, and repeat neck exploration.  This was performed on postoperative day 4. No frank pus was identified in the neck. Direct laryngoscopy still show substantial pharyngeal edema with airway compromise.  From this point, she was immediately off the ventilator, and advanced activity. She was relatively shortly able to tolerate the trach cuff deflated. She was receiving nasogastric feeding. She was unable to participate in [name] valve trials medially. This started approximately one week after  the tracheostomy tube. She was afebrile and white blood cell count returned toward normal. She did have substantial oxygen requirements which were gradually weaned.   It was decided that she would probably  go home with a tracheostomy tube, and supplemental oxygen. She had a modified dysphagia diet per speech pathology, after a modified barium swallow. She was not felt to be a candidate for inpatient rehabilitation.  She completed 14 days of intravenous Rocephin.  She had arrangements with home health, and family teaching. Her primary caretaker was somewhat slow and uncomfortable with the teaching. She was kept in the hospital 2 additional days over the weekend. On postoperative day 16/12, she had tolerated the tracheostomy tube plugged for 24 hours and the tube was removed without difficulty. An occlusive dressing was applied.  Patient was discharged to home in care of family with home health to assist. No tracheostomy. Possible supplemental oxygen. Recheck my office one week. Hydrocodone prescription.    Kimberly May

## 2013-06-26 NOTE — Progress Notes (Signed)
Notified the nurse for Dr. Lazarus SalinesWolicki that patient oxygen saturations did not drop below 91% when she ambulated the entire unit, because this patient does not qualify for oxygen therapy at home with her insurance.

## 2013-07-03 ENCOUNTER — Inpatient Hospital Stay: Payer: BC Managed Care – PPO | Admitting: Adult Health

## 2013-07-18 ENCOUNTER — Encounter: Payer: Self-pay | Admitting: Adult Health

## 2013-07-27 ENCOUNTER — Ambulatory Visit (INDEPENDENT_AMBULATORY_CARE_PROVIDER_SITE_OTHER): Payer: BC Managed Care – PPO | Admitting: Obstetrics and Gynecology

## 2013-07-27 ENCOUNTER — Encounter: Payer: Self-pay | Admitting: Obstetrics and Gynecology

## 2013-07-27 ENCOUNTER — Encounter (INDEPENDENT_AMBULATORY_CARE_PROVIDER_SITE_OTHER): Payer: Self-pay

## 2013-07-27 VITALS — BP 120/90 | Ht 60.0 in | Wt 216.2 lb

## 2013-07-27 DIAGNOSIS — Z309 Encounter for contraceptive management, unspecified: Secondary | ICD-10-CM

## 2013-07-27 DIAGNOSIS — Z3049 Encounter for surveillance of other contraceptives: Secondary | ICD-10-CM

## 2013-07-27 NOTE — Progress Notes (Signed)
This chart was scribed by Leone PayorSonum Patel, Medical Scribe, for Dr. Christin BachJohn Shontay Wallner on 07/27/13 at 4:29 PM. This chart was reviewed by Dr. Christin BachJohn Kale Dols and is accurate.   [facility] Visit  Patient name: Kimberly May MRN [id number]  Date of birth: 1990/09/17  CC & HPI:  Kimberly Burnett is a 23 y.o. female presenting today for Nexplanon removal. She has had this for about 5 years. She reports having HA, weight gain, and vaginal bleeding while having the implant. She would like to discuss having BTL and is convinced she does not want any more children. She has a 23 year old child currently. States her significant other is agreeable to this plan.   ROS:  Negative except noted above   Pertinent History Reviewed:  Medical & Surgical Hx:  Reviewed: Significant for   Past Medical History  Diagnosis Date  . Tuberculosis 15 YRS AGO     FATHER HAD TB , PT WAS MED [place]    Past Surgical History  Procedure Laterality Date  . Tympanostomy tube placement  DONE TWICE     BILAT    [facility]  . Cesarean section  2009   . Tonsillectomy  04/10/2011    Procedure: TONSILLECTOMY;  Surgeon: Antony Contraswight D Bates;  Location: [facility] OR;  Service: ENT;  Laterality: Bilateral;  . Incision and drainage abscess N/A 06/04/2013    Procedure: INCISION AND DRAINAGE PHARYNGEAL ABSCESS;  Surgeon: Melvenia BeamMitchell Gore, MD;  [facility];  Service: ENT;  Laterality: N/A;  . Adenoidectomy    . Incision and drainage of peritonsillar abcess Left 06/09/2013    Procedure: INCISION AND DRAINAGE OF left retropharyneal abscess;  Surgeon: Flo ShanksKarol Wolicki, MD;  [facility];  Service: ENT;  Laterality: Left;  . Tracheostomy tube placement N/A 06/14/2013    Procedure: TRACHEOSTOMY;  Surgeon: Flo ShanksKarol Wolicki, MD;  [facility];  Service: ENT;  Laterality: N/A;  . Direct laryngoscopy N/A 06/14/2013    Procedure: DIRECT LARYNGOSCOPY/RE-EXPLORATORY OF NECK;  Surgeon: Flo ShanksKarol Wolicki, MD;  [facility];  Service: ENT;  Laterality: N/A;     Medications: Reviewed & Updated - see associated section Social History: Reviewed -  reports that she has never smoked. She has never used smokeless tobacco.  Objective Findings:  Vitals: BP 120/90  Ht 5' (1.524 m)  Wt 216 lb 3.2 oz (98.068 kg)  BMI 42.22 kg/m2  LMP 07/24/2012  Physical Examination: General appearance - alert, well appearing, and in no distress and oriented to person, place, and time Pelvic - examination not indicated   Assessment & Plan:    A: 1. Contraception management   P: 1. Follow up in 1-2 weeks for IUD placement and nexplanon removal

## 2013-08-08 ENCOUNTER — Encounter: Payer: Self-pay | Admitting: Advanced Practice Midwife

## 2013-08-08 ENCOUNTER — Ambulatory Visit (INDEPENDENT_AMBULATORY_CARE_PROVIDER_SITE_OTHER): Payer: BC Managed Care – PPO | Admitting: Advanced Practice Midwife

## 2013-08-08 VITALS — BP 124/80 | Ht 60.0 in | Wt 216.0 lb

## 2013-08-08 DIAGNOSIS — Z3202 Encounter for pregnancy test, result negative: Secondary | ICD-10-CM

## 2013-08-08 DIAGNOSIS — Z30430 Encounter for insertion of intrauterine contraceptive device: Secondary | ICD-10-CM

## 2013-08-08 DIAGNOSIS — Z3046 Encounter for surveillance of implantable subdermal contraceptive: Secondary | ICD-10-CM

## 2013-08-08 LAB — POCT URINE PREGNANCY: Preg Test, Ur: NEGATIVE

## 2013-08-08 NOTE — Progress Notes (Signed)
Kimberly May is a 23 y.o. year old Caucasian female Gravida 1 Para 1  who presents for placement of a Mirena IUD. She currently has a Nexplanon and has gained a lot of weight.Her pregnancy test today is negative.  The risks and benefits of the method and placement have been thouroughly reviewed with the patient and all questions were answered.  Specifically the patient is aware of failure rate of 05/998, expulsion of the IUD and of possible perforation.  The patient is aware of irregular bleeding due to the method and understands the incidence of irregular bleeding diminishes with time.  Time out was performed.  A Graves speculum was placed.  The cervix was prepped using Betadine. The uterus was found to be neutral and it sounded to 8 cm.  The cervix was grasped with a tenaculum and the IUD was inserted to 8 cm.  It was pulled back 1 cm and the IUD was disengaged.  The strings were trimmed to 3 cm.  Sonogram was performed and the proper placement of the IUD was verified.  The patient was instructed on signs and symptoms of infection and to check for the strings after each menses or each month.  The patient is to refrain from intercourse for 3 days.  Patient given informed consent for removal of her Implanon, time out was performed.  Signed copy in the chart.  Appropriate time out taken. Implanon site identified.  Area prepped in usual sterile fashon. One cc of 1% lidocaine was used to anesthetize the area at the distal end of the implant. A small stab incision was made right beside the implant on the distal portion.  The implanon rod was grasped using hemostats and removed without difficulty.  There was less than 3 cc blood loss. There were no complications.  A small amount of antibiotic ointment and steri-strips were applied over the small incision.  A pressure bandage was applied to reduce any bruising.  The patient tolerated the procedure well and was given post procedure  instructions.    The patient is scheduled for a return appointment after her first menses or 4 weeks.  CRESENZO-DISHMAN,Baby Gieger 08/08/2013 2:15 PM

## 2013-09-06 ENCOUNTER — Ambulatory Visit: Payer: BC Managed Care – PPO | Admitting: Advanced Practice Midwife

## 2013-09-12 ENCOUNTER — Ambulatory Visit: Payer: BC Managed Care – PPO | Admitting: Advanced Practice Midwife

## 2013-09-20 ENCOUNTER — Ambulatory Visit: Payer: BC Managed Care – PPO | Admitting: Advanced Practice Midwife

## 2014-08-14 IMAGING — CT CT NECK W/ CM
3 of 5 series · 12 of 33 positions shown, 14 images · IV contrast (APPLIED)
Comparison: CT NECK W/CM dated 06/09/2013; CT NECK W/CM dated
06/04/2013; DG CHEST 1V PORT dated 06/12/2013

CLINICAL DATA: Evaluate left retropharyngeal abscess with
prevertebral spread.

EXAM:
CT NECK WITH CONTRAST
TECHNIQUE: Multidetector CT imaging of the neck was performed using the
standard protocol following the bolus administration of intravenous
contrast.
CONTRAST:  100mL OMNIPAQUE IOHEXOL 350 MG/ML SOLN

[Series 5: coronal st · coronal · 0.58mm/px · 3 of 101 slices shown]
[im 21/101  bone]
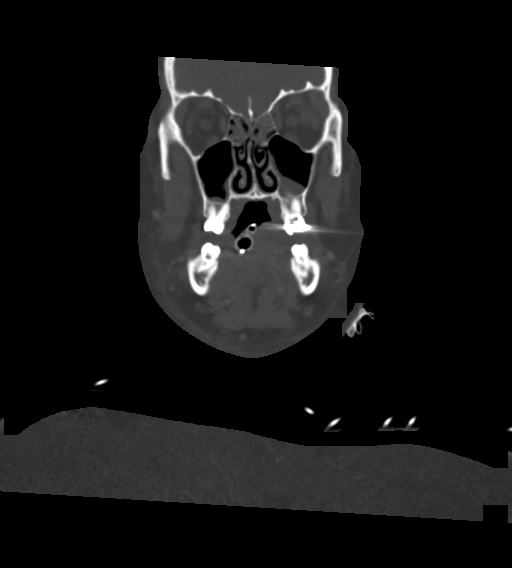
[im 41/101  bone]
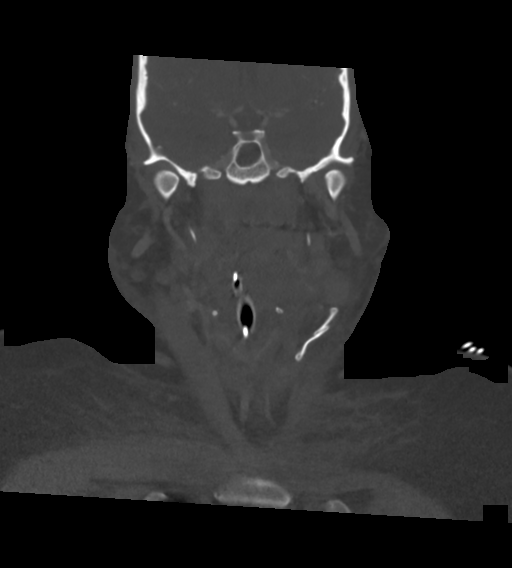
[im 61/101  bone]
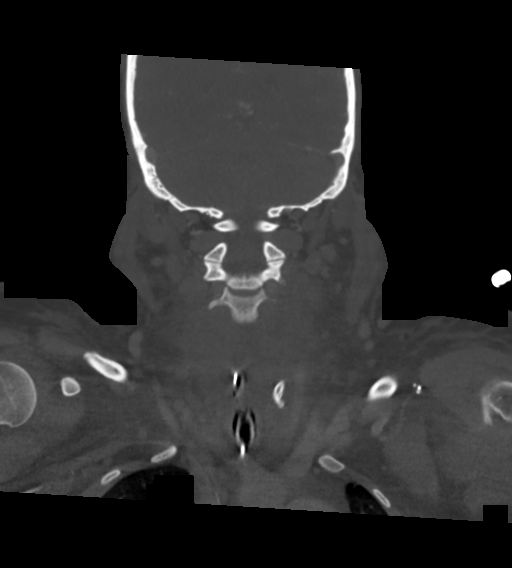

[Series 6: sagittal st · sagittal · 0.48mm/px · 5 of 101 slices shown, 6 images]
[im 34/101  bone]
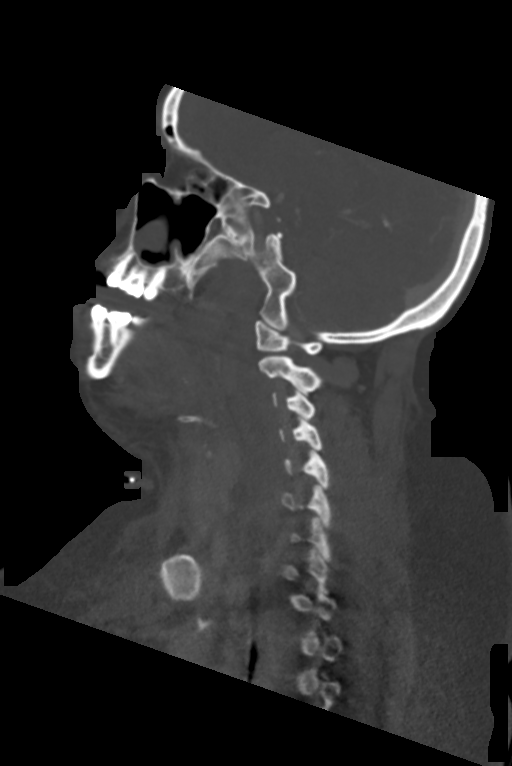
[im 42/101  bone]
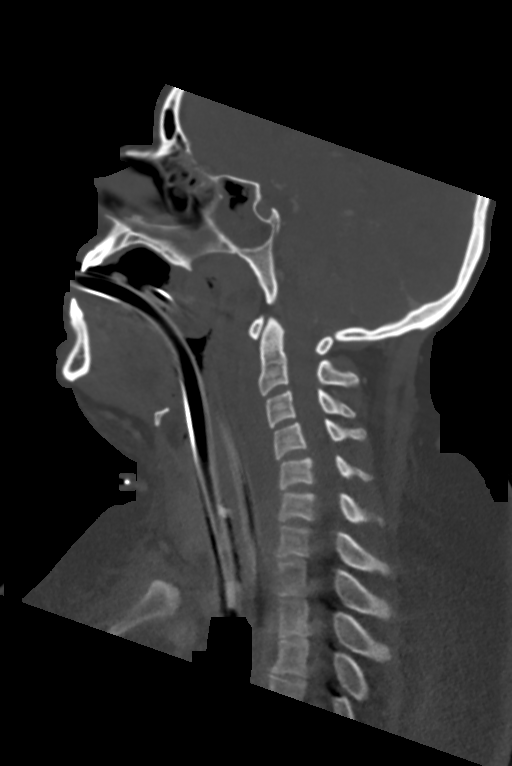
[im 51/101  soft-tissue]
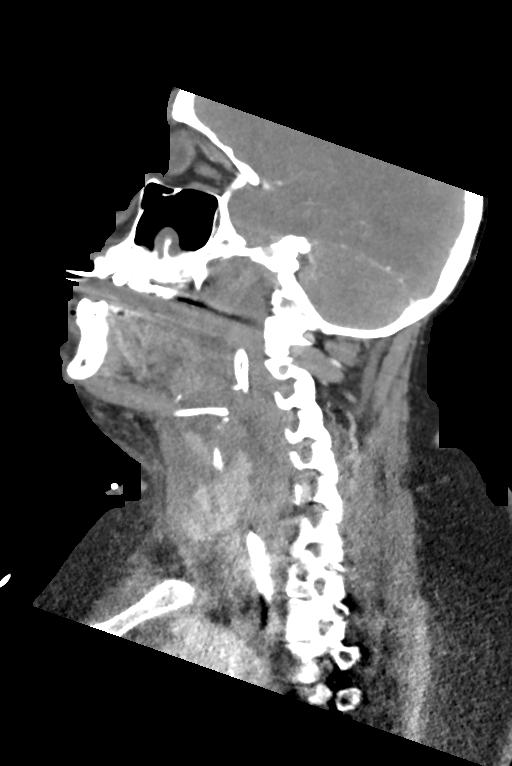
[im 51/101  bone]
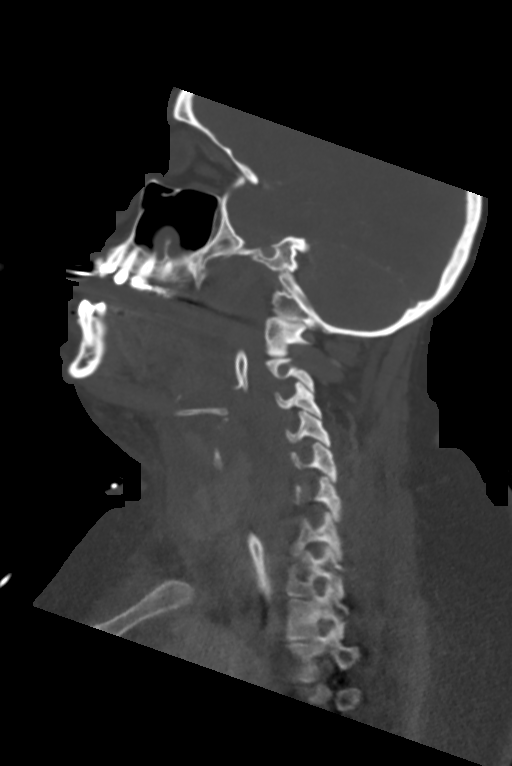
[im 59/101  bone]
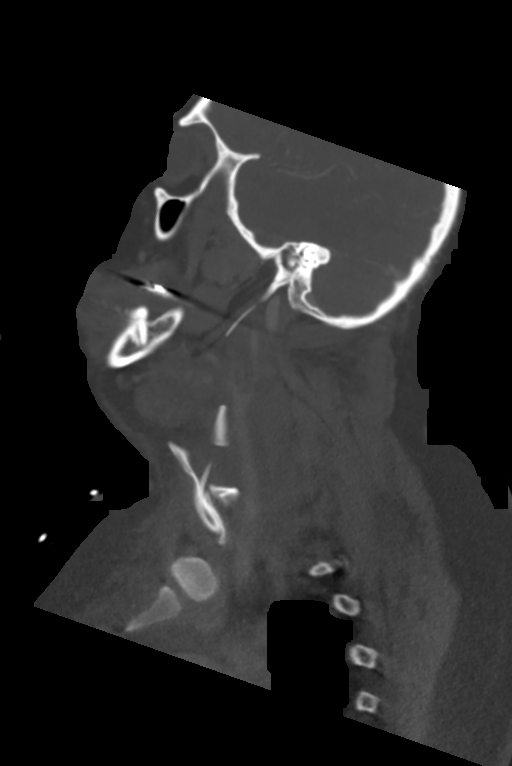
[im 67/101  bone]
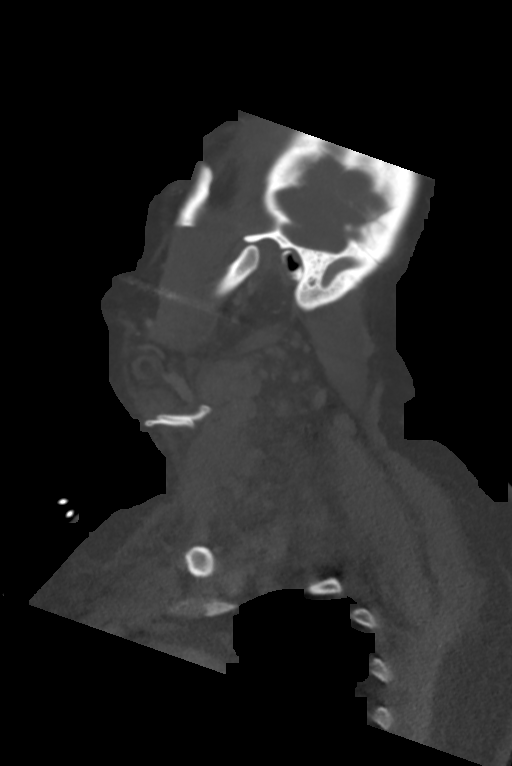

[Series 7: orthogonal st · axial · 0.43mm/px · z∈[-376,-189]mm · 4 of 145 slices shown, 5 images]
[im 25/145  soft-tissue]
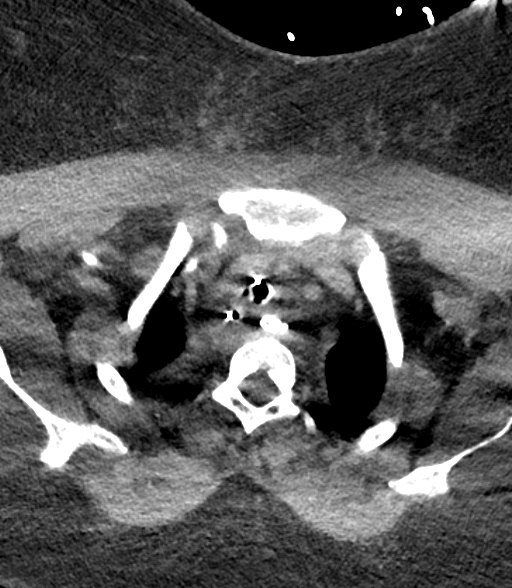
[im 25/145  bone]
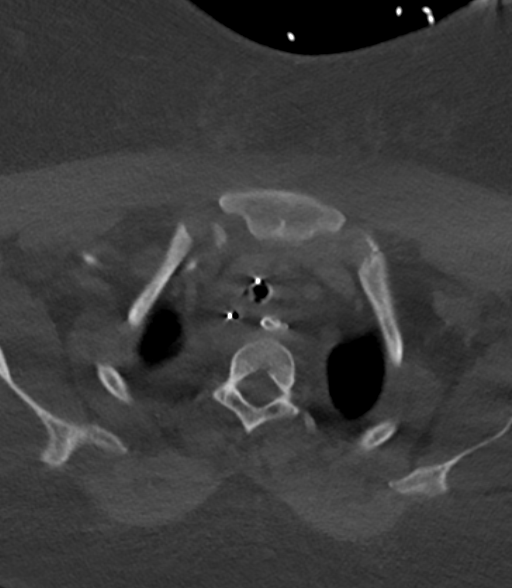
[im 49/145  bone]
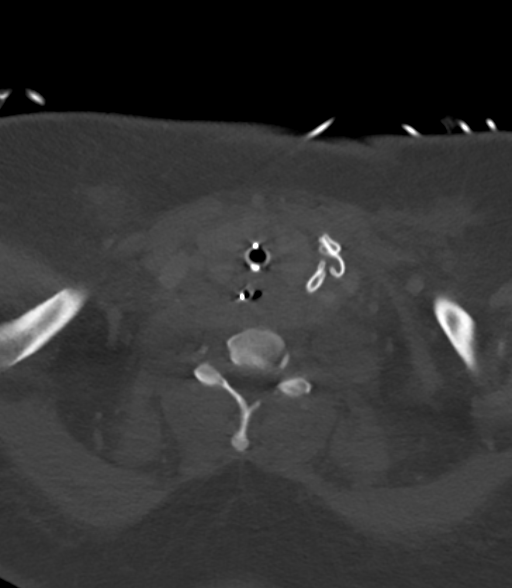
[im 97/145  bone]
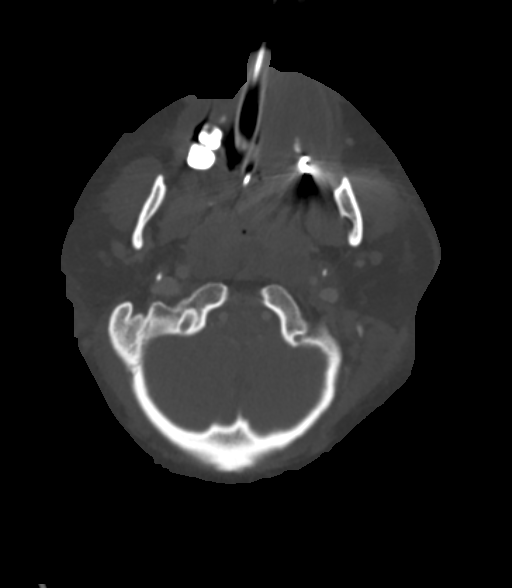
[im 121/145  bone]
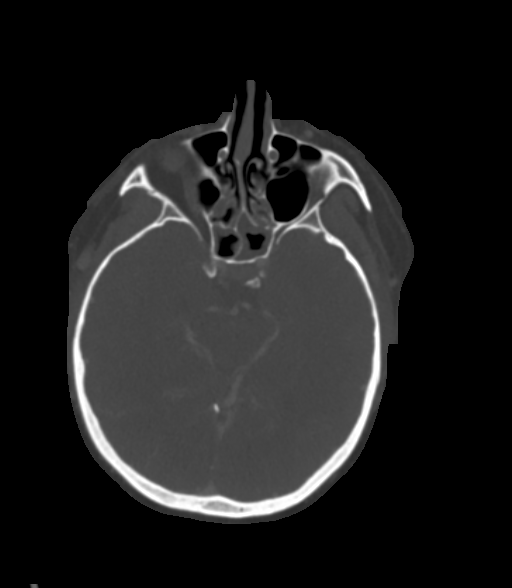

[12 of 33 positions shown; findings below may reference images not displayed]

FINDINGS: The visualized portion of the brain is unremarkable. Visualized
orbits are unremarkable. There is partial opacification of multiple
ethmoid air cells bilaterally. There is moderate bilateral sphenoid
sinus mucosal thickening, and there is mild bilateral maxillary
sinus mucosal thickening. Bilateral mastoid effusions are present.

Endotracheal and enteric tubes are now in place. Surgical drains are
now present in the left neck extending superiorly in the region of
the left lateral retropharyngeal space as well as inferiorly in the
left neck into the superior mediastinum posterior to the trachea.
Pooled secretions are present in the nasopharynx and oropharynx.
There has been interval incision and drainage of the previously
described suppurated left lateral retropharyngeal lymph node.
Enlarged lateral retropharyngeal lymph nodes are again seen
bilaterally, measuring approximately 1.4 cm in short axis on the
right and 1.5 cm on the left, similar in size to the prior study.

There remains inflammation/ phlegmon/ edema throughout the
retropharyngeal space measuring up up to approximately 1.5 cm in AP
thickness, stable to minimally improved from the prior study. No
discrete, organized fluid collection is identified. Inflammation
remains in the parapharyngeal spaces bilaterally as well. Evaluation
of the superior mediastinum for fluid/fluid collections is limited
due to beam attenuation by overlying soft tissues, although there
does appear to be fat stranding in the mediastinum.

The internal jugular veins appear patent. The common and cervical
internal carotid arteries appear patent. Enlarged, reactive lymph
nodes are again seen, the largest measuring 1.4 cm in short axis on
the left in level II, not significantly changed. Subcutaneous fat
stranding is present throughout the anterior and lateral neck.

A right pleural effusion remains. There is also likely a small left
pleural effusion. No acute osseous abnormality is identified.
IMPRESSION: 1. Interval incision and drainage of suppurated left lateral
retropharyngeal lymph node. Persistent edema and phlegmon throughout
the retropharyngeal space without definite evidence of new abscess.
2. Persistent right pleural effusion. Possible left pleural
effusion.

## 2014-08-15 IMAGING — CR DG CHEST 1V PORT
1 series · 1 of 1 positions shown · non-contrast
Comparison: DG CHEST 1V PORT dated 06/12/2013

CLINICAL DATA: Evaluate endotracheal tube

EXAM:
PORTABLE CHEST - 1 VIEW

[AP]
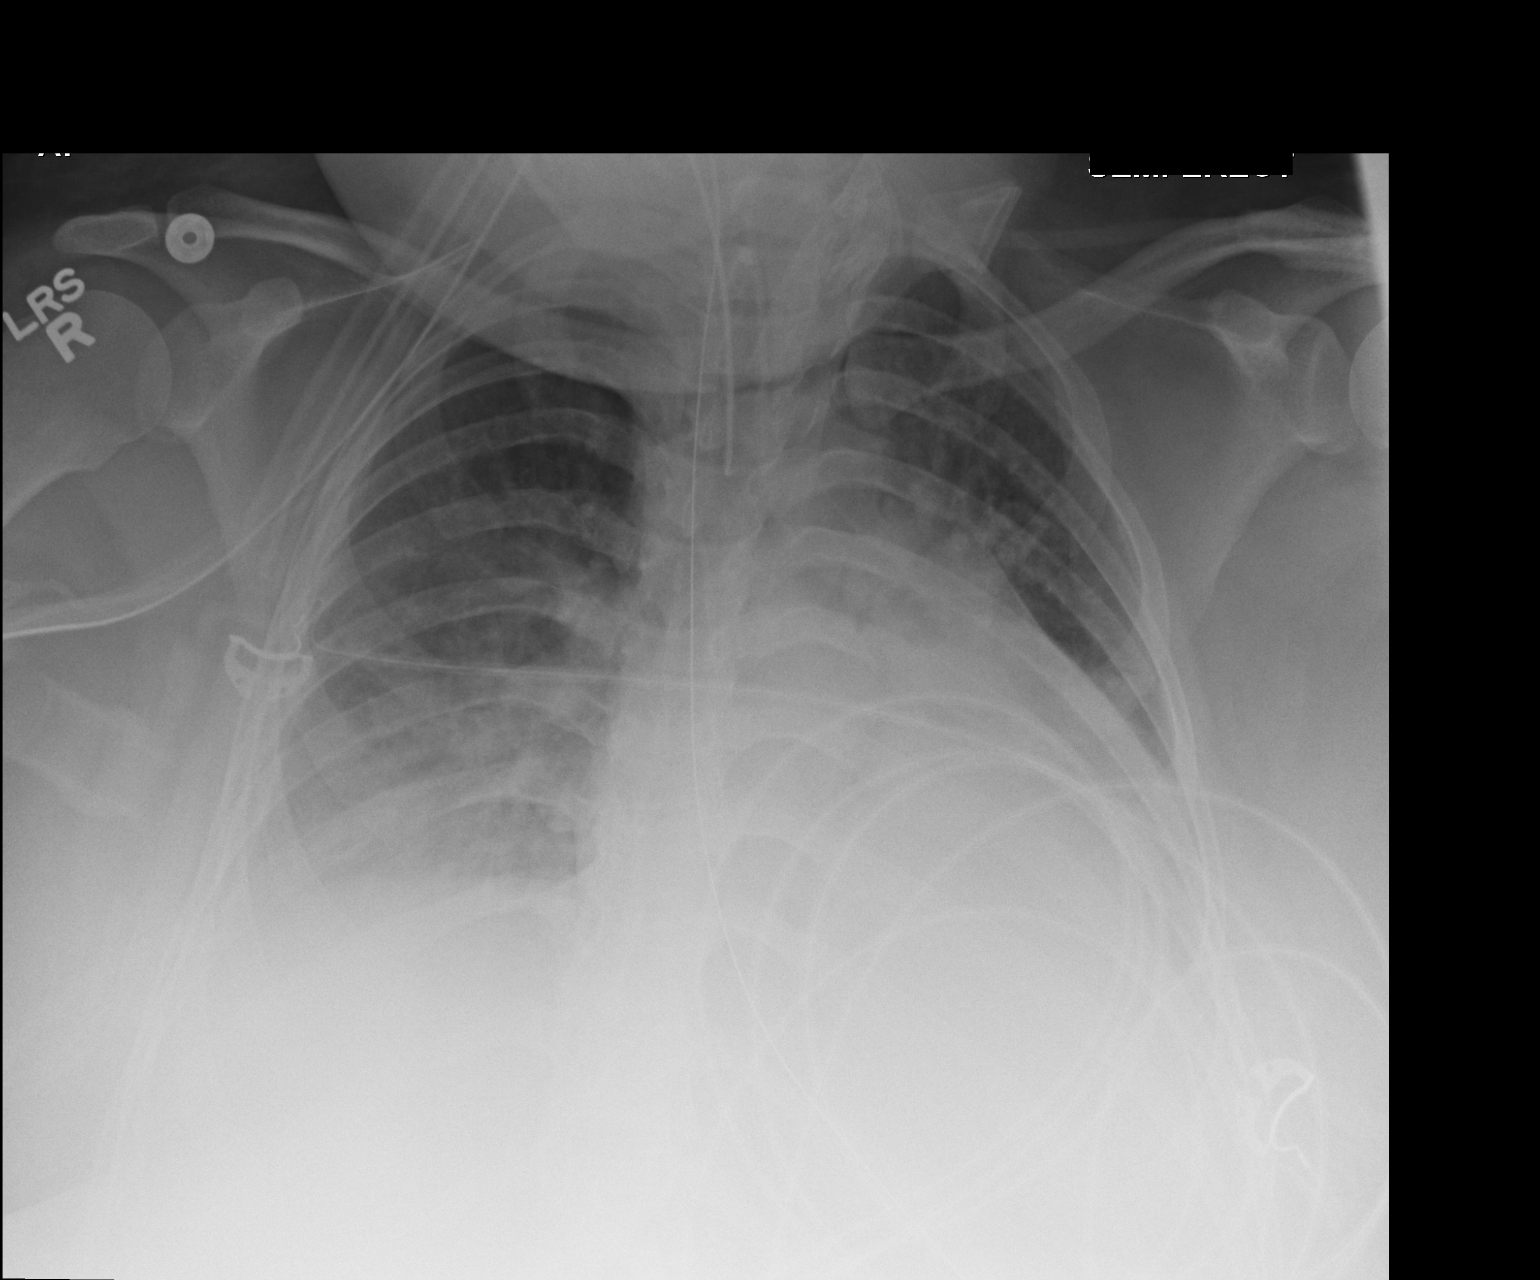

[1 of 1 positions shown; findings below may reference images not displayed]

FINDINGS: Mildly degraded exam due to AP portable technique and patient body
habitus. Endotracheal tube 2.0 cm above carina. Nasogastric tube
extends beyond the inferior aspect of the film.

Cardiomegaly accentuated by AP portable technique. Probable small
bilateral pleural effusions. No pneumothorax. Extremely low lung
volumes. Mild to moderate interstitial edema, accentuated by low
volumes. No change in basilar predominant airspace disease
bilaterally.
IMPRESSION: Given differences in lung volumes/inspiratory effort, no significant
change.

Low lung volumes with interstitial edema and layering bilateral
pleural effusions.

Bibasilar Airspace disease, atelectasis or infection.

## 2014-08-19 IMAGING — CR DG CHEST 1V PORT
1 series · 1 of 1 positions shown · non-contrast
Comparison: 06/15/2013

CLINICAL DATA: Followup infiltrates

EXAM:
PORTABLE CHEST - 1 VIEW

[AP]
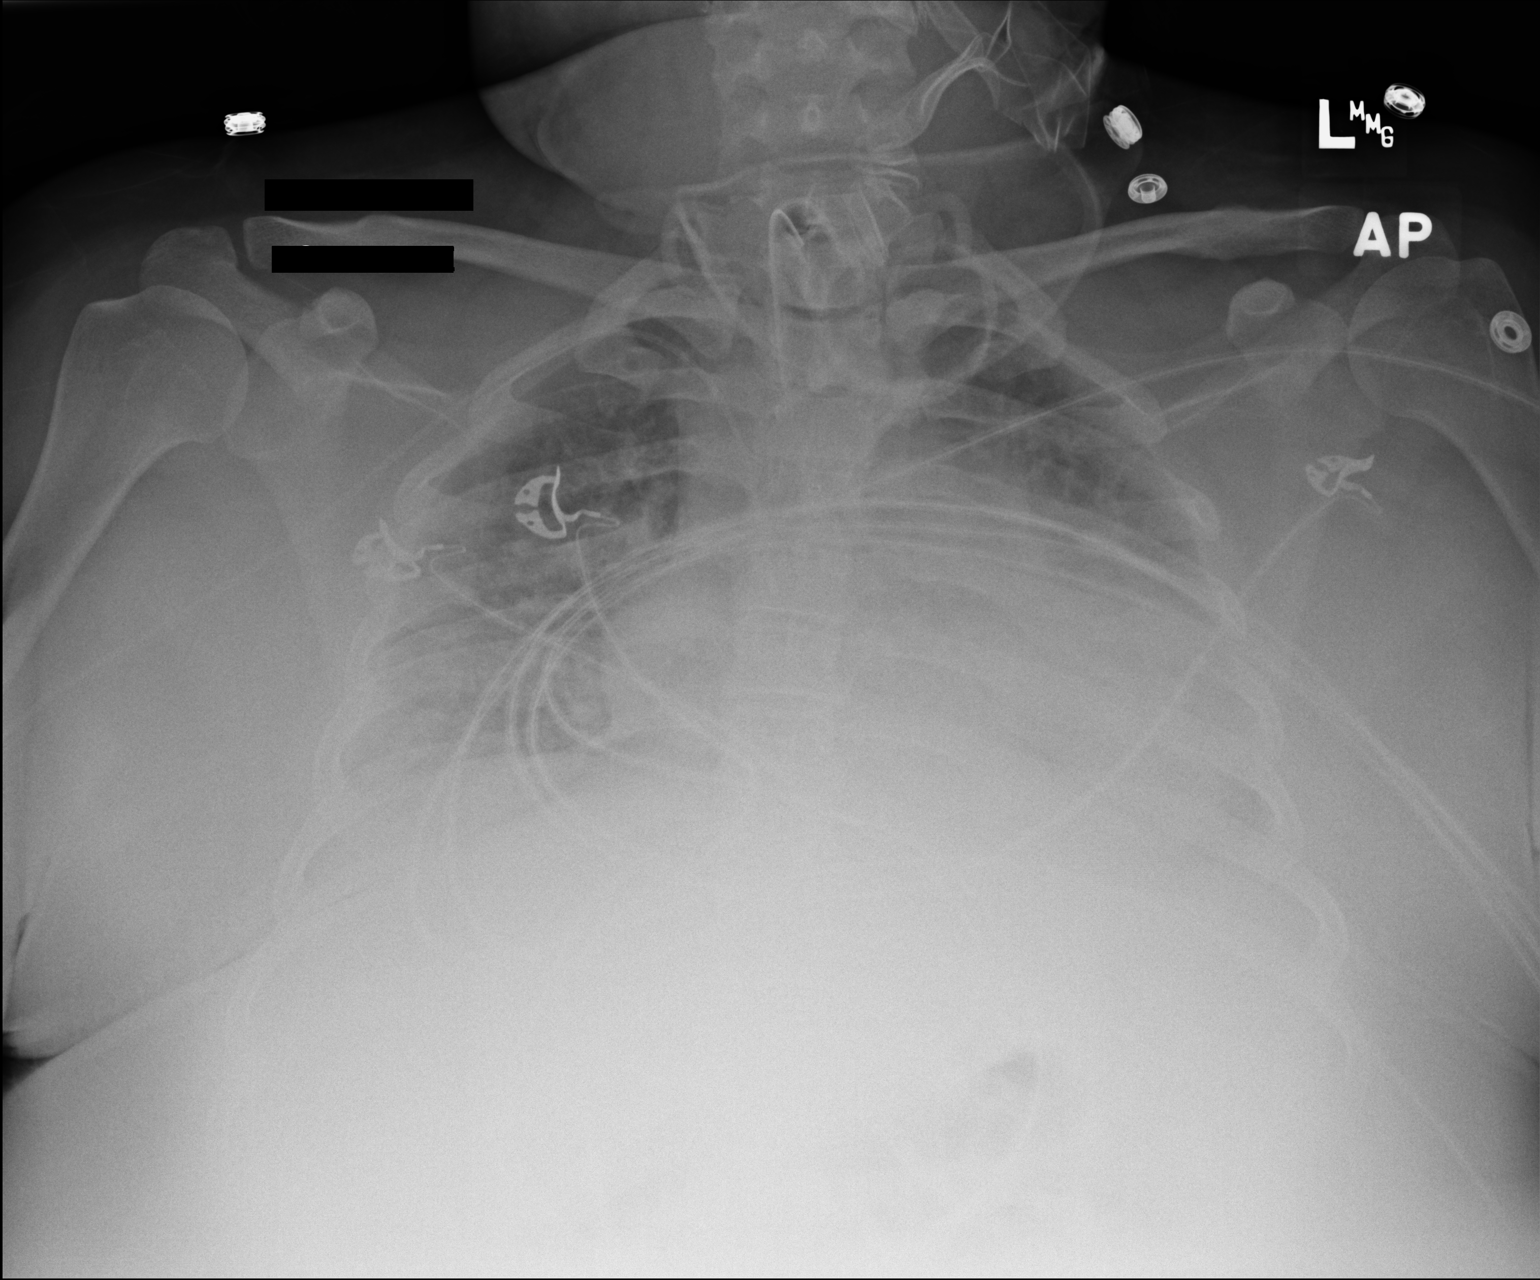

[1 of 1 positions shown; findings below may reference images not displayed]

FINDINGS: Cardiac shadow remains enlarged. Vascular congestion is noted. A
right-sided PICC line is seen and stable. Tracheostomy tube is
stable as well. .
IMPRESSION: Poor inspiratory effort. Increased central vascular congestion is
noted

## 2015-02-05 ENCOUNTER — Encounter (HOSPITAL_COMMUNITY): Payer: Self-pay | Admitting: Emergency Medicine

## 2015-02-05 ENCOUNTER — Emergency Department (HOSPITAL_COMMUNITY)
Admission: EM | Admit: 2015-02-05 | Discharge: 2015-02-05 | Disposition: A | Discharge status: Home / Self Care | Payer: Medicaid Other | Attending: Emergency Medicine | Admitting: Emergency Medicine

## 2015-02-05 DIAGNOSIS — R110 Nausea: Secondary | ICD-10-CM | POA: Diagnosis not present

## 2015-02-05 DIAGNOSIS — Z8611 Personal history of tuberculosis: Secondary | ICD-10-CM | POA: Insufficient documentation

## 2015-02-05 DIAGNOSIS — Z880 Allergy status to penicillin: Secondary | ICD-10-CM | POA: Diagnosis not present

## 2015-02-05 DIAGNOSIS — M791 Myalgia: Secondary | ICD-10-CM | POA: Insufficient documentation

## 2015-02-05 DIAGNOSIS — J0100 Acute maxillary sinusitis, unspecified: Secondary | ICD-10-CM

## 2015-02-05 DIAGNOSIS — R52 Pain, unspecified: Secondary | ICD-10-CM | POA: Diagnosis present

## 2015-02-05 DIAGNOSIS — J029 Acute pharyngitis, unspecified: Secondary | ICD-10-CM | POA: Diagnosis not present

## 2015-02-05 MED ORDER — CEPHALEXIN 500 MG PO CAPS: 500.0000 mg | ORAL_CAPSULE | Freq: Four times a day (QID) | ORAL | Status: DC

## 2015-02-05 NOTE — Discharge Instructions (Signed)

## 2015-02-05 NOTE — ED Notes (Signed)
Patient with no complaints at this time. Respirations even and unlabored. Skin warm/dry. Discharge instructions reviewed with patient at this time. Patient given opportunity to voice concerns/ask questions. Patient discharged at this time and left Emergency Department with steady gait.   

## 2015-02-05 NOTE — ED Notes (Signed)
Sore throat, generalized body aches, fever since yesterday. Barky cough noted.

## 2015-02-05 NOTE — ED Provider Notes (Signed)
CSN: [id number]     Arrival date & time 02/05/15  0831 History   First MD Initiated Contact with Patient 02/05/15 [phone]     Chief Complaint  Patient presents with  . Generalized Body Aches     (Consider location/radiation/quality/duration/timing/severity/associated sxs/prior Treatment) Patient is a 24 y.o. female presenting with pharyngitis. The history is provided by the patient. No language interpreter was used.  Sore Throat This is a new problem. The current episode started today. The problem occurs constantly. The problem has been gradually worsening. Associated symptoms include a fever, headaches, myalgias and nausea. Nothing aggravates the symptoms. She has tried nothing for the symptoms. The treatment provided moderate relief.    Past Medical History  Diagnosis Date  . Tuberculosis 15 YRS AGO     FATHER HAD TB , PT WAS MED [place]   Past Surgical History  Procedure Laterality Date  . Tympanostomy tube placement  DONE TWICE     BILAT    [facility])  . Cesarean section  2009   . Tonsillectomy  04/10/2011    Procedure: TONSILLECTOMY;  Surgeon: Antony Contras;  Location: [facility] OR;  Service: ENT;  Laterality: Bilateral;  . Incision and drainage abscess N/A 06/04/2013    Procedure: INCISION AND DRAINAGE PHARYNGEAL ABSCESS;  Surgeon: Melvenia Beam, MD;  Location: [facility] OR;  Service: ENT;  Laterality: N/A;  . Adenoidectomy    . Incision and drainage of peritonsillar abcess Left 06/09/2013    Procedure: INCISION AND DRAINAGE OF left retropharyneal abscess;  Surgeon: Flo Shanks, MD;  Location: [facility] OR;  Service: ENT;  Laterality: Left;  . Tracheostomy tube placement N/A 06/14/2013    Procedure: TRACHEOSTOMY;  Surgeon: Flo Shanks, MD;  Location: [facility] OR;  Service: ENT;  Laterality: N/A;  . Direct laryngoscopy N/A 06/14/2013    Procedure: DIRECT LARYNGOSCOPY/RE-EXPLORATORY OF NECK;  Surgeon: Flo Shanks, MD;  Location: [facility] OR;  Service: ENT;  Laterality: N/A;  . Tonsillectomy     Family History   Problem Relation Age of Onset  . Cancer Father     larynx  . Other Paternal Grandfather     colon tumor  . Thyroid disease Paternal Grandmother   . Cancer Maternal Grandmother     hodgkins lymphoma, cervical  . Heart disease Maternal Grandmother    Social History  Substance Use Topics  . Smoking status: Never Smoker   . Smokeless tobacco: Never Used  . Alcohol Use: No   OB History    No data available     Review of Systems  Constitutional: Positive for fever.  Gastrointestinal: Positive for nausea.  Musculoskeletal: Positive for myalgias.  Neurological: Positive for headaches.  All other systems reviewed and are negative.     Allergies  Penicillins  Home Medications   Prior to Admission medications   Medication Sig Start Date End Date Taking? Authorizing Provider  cephALEXin (KEFLEX) 500 MG capsule Take 1 capsule (500 mg total) by mouth 4 (four) times daily. 02/05/15   Elson Areas, PA-C  etonogestrel (IMPLANON) 68 MG IMPL implant Inject 1 each into the skin once.      Historical Provider, MD  HYDROcodone-acetaminophen (NORCO/VICODIN) 5-325 MG per tablet Take 1-2 tablets by mouth every 4 (four) hours as needed for moderate pain. 06/26/13   Flo Shanks, MD   BP 133/86 mmHg  Pulse 89  Temp(Src) 97.9 F (36.6 C) (Oral)  Resp 19  Ht 5' (1.524 m)  Wt 208 lb (94.348 kg)  BMI 40.62 kg/m2  SpO2  100% Physical Exam  Constitutional: She appears well-developed and well-nourished.  HENT:  Head: Normocephalic.  Right Ear: External ear normal.  Left Ear: External ear normal.  Nose: Nose normal.  Mouth/Throat: Oropharynx is clear and moist.  Eyes: Conjunctivae and EOM are normal. Pupils are equal, round, and reactive to light.  Neck: Normal range of motion. Neck supple.  Cardiovascular: Normal rate and normal heart sounds.   Pulmonary/Chest: Effort normal and breath sounds normal.  Abdominal: Soft.  Musculoskeletal: Normal range of motion.  Neurological: She is  alert.  Skin: Skin is warm.  Nursing note and vitals reviewed.   ED Course  Procedures (including critical care time) Labs Review Labs Reviewed - No data to display  Imaging Review No results found. I have personally reviewed and evaluated these images and lab results as part of my medical decision-making.   EKG Interpretation None      MDM   Final diagnoses:  Acute maxillary sinusitis, recurrence not specified    Keflex avs Return if any problems.    Lonia Skinner Delta, PA-C 02/05/15 1320  Blane Ohara, MD 02/06/15 [phone]

## 2015-03-05 ENCOUNTER — Encounter (HOSPITAL_COMMUNITY): Payer: Self-pay

## 2015-03-05 ENCOUNTER — Emergency Department (HOSPITAL_COMMUNITY)
Admission: EM | Admit: 2015-03-05 | Discharge: 2015-03-05 | Disposition: A | Discharge status: Home / Self Care | Payer: Medicaid Other | Attending: Emergency Medicine | Admitting: Emergency Medicine

## 2015-03-05 DIAGNOSIS — Z880 Allergy status to penicillin: Secondary | ICD-10-CM | POA: Insufficient documentation

## 2015-03-05 DIAGNOSIS — R300 Dysuria: Secondary | ICD-10-CM | POA: Diagnosis present

## 2015-03-05 DIAGNOSIS — N12 Tubulo-interstitial nephritis, not specified as acute or chronic: Secondary | ICD-10-CM | POA: Diagnosis not present

## 2015-03-05 DIAGNOSIS — R51 Headache: Secondary | ICD-10-CM | POA: Diagnosis not present

## 2015-03-05 DIAGNOSIS — Z3202 Encounter for pregnancy test, result negative: Secondary | ICD-10-CM | POA: Diagnosis not present

## 2015-03-05 DIAGNOSIS — R42 Dizziness and giddiness: Secondary | ICD-10-CM | POA: Insufficient documentation

## 2015-03-05 LAB — URINALYSIS, ROUTINE W REFLEX MICROSCOPIC
Bilirubin Urine: NEGATIVE
Glucose, UA: NEGATIVE mg/dL
Ketones, ur: NEGATIVE mg/dL
NITRITE: NEGATIVE
Protein, ur: 30 mg/dL — AB
Specific Gravity, Urine: 1.025 (ref 1.005–1.030)
UROBILINOGEN UA: 0.2 mg/dL (ref 0.0–1.0)
pH: 5.5 (ref 5.0–8.0)

## 2015-03-05 LAB — URINE MICROSCOPIC-ADD ON

## 2015-03-05 LAB — PREGNANCY, URINE: PREG TEST UR: NEGATIVE

## 2015-03-05 MED ORDER — PHENAZOPYRIDINE HCL 100 MG PO TABS
100.0000 mg | ORAL_TABLET | Freq: Once | ORAL | Status: AC
  Administered 2015-03-05: 100 mg via ORAL
  Filled 2015-03-05: qty 1

## 2015-03-05 MED ORDER — HYDROCODONE-ACETAMINOPHEN 5-325 MG PO TABS: 1.0000 | ORAL_TABLET | ORAL | Status: DC | PRN

## 2015-03-05 MED ORDER — CIPROFLOXACIN HCL 250 MG PO TABS
500.0000 mg | ORAL_TABLET | Freq: Once | ORAL | Status: AC
  Administered 2015-03-05: 500 mg via ORAL
  Filled 2015-03-05: qty 2

## 2015-03-05 MED ORDER — PHENAZOPYRIDINE HCL 200 MG PO TABS: 200.0000 mg | ORAL_TABLET | Freq: Three times a day (TID) | ORAL | Status: DC

## 2015-03-05 MED ORDER — ONDANSETRON 4 MG PO TBDP: 4.0000 mg | ORAL_TABLET | Freq: Three times a day (TID) | ORAL | Status: DC | PRN

## 2015-03-05 MED ORDER — HYDROCODONE-ACETAMINOPHEN 5-325 MG PO TABS
1.0000 | ORAL_TABLET | Freq: Once | ORAL | Status: AC
  Administered 2015-03-05: 1 via ORAL
  Filled 2015-03-05: qty 1

## 2015-03-05 MED ORDER — CIPROFLOXACIN HCL 500 MG PO TABS: 500.0000 mg | ORAL_TABLET | Freq: Two times a day (BID) | ORAL | Status: DC

## 2015-03-05 MED ORDER — ONDANSETRON HCL 4 MG PO TABS
4.0000 mg | ORAL_TABLET | Freq: Once | ORAL | Status: AC
  Administered 2015-03-05: 4 mg via ORAL
  Filled 2015-03-05: qty 1

## 2015-03-05 MED ORDER — ONDANSETRON HCL 4 MG/2ML IJ SOLN
4.0000 mg | Freq: Once | INTRAMUSCULAR | Status: DC
  Filled 2015-03-05: qty 2

## 2015-03-05 NOTE — ED Provider Notes (Signed)
CSN: [id number]     Arrival date & time 03/05/15  0710 History   First MD Initiated Contact with Patient 03/05/15 [phone]     Chief Complaint  Patient presents with  . Dysuria     HPI  Patient presents for evaluation of difficulty urinating and abdominal pain. Symptoms for 24 hours back pain and lower abdominal pain yesterday. Headache and dizzy yesterday. None today noticed hematuria this morning and presents here.  Past Medical History  Diagnosis Date  . Tuberculosis 15 YRS AGO     FATHER HAD TB , PT WAS MED [place]   Past Surgical History  Procedure Laterality Date  . Tympanostomy tube placement  DONE TWICE     BILAT    [facility])  . Cesarean section  2009   . Tonsillectomy  04/10/2011    Procedure: TONSILLECTOMY;  Surgeon: Antony Contras;  Location: [facility] OR;  Service: ENT;  Laterality: Bilateral;  . Incision and drainage abscess N/A 06/04/2013    Procedure: INCISION AND DRAINAGE PHARYNGEAL ABSCESS;  Surgeon: Melvenia Beam, MD;  Location: [facility] OR;  Service: ENT;  Laterality: N/A;  . Adenoidectomy    . Incision and drainage of peritonsillar abcess Left 06/09/2013    Procedure: INCISION AND DRAINAGE OF left retropharyneal abscess;  Surgeon: Flo Shanks, MD;  Location: [facility] OR;  Service: ENT;  Laterality: Left;  . Tracheostomy tube placement N/A 06/14/2013    Procedure: TRACHEOSTOMY;  Surgeon: Flo Shanks, MD;  Location: [facility] OR;  Service: ENT;  Laterality: N/A;  . Direct laryngoscopy N/A 06/14/2013    Procedure: DIRECT LARYNGOSCOPY/RE-EXPLORATORY OF NECK;  Surgeon: Flo Shanks, MD;  Location: [facility] OR;  Service: ENT;  Laterality: N/A;  . Tonsillectomy     Family History  Problem Relation Age of Onset  . Cancer Father     larynx  . Other Paternal Grandfather     colon tumor  . Thyroid disease Paternal Grandmother   . Cancer Maternal Grandmother     hodgkins lymphoma, cervical  . Heart disease Maternal Grandmother    Social History  Substance Use Topics  . Smoking status: Never  Smoker   . Smokeless tobacco: Never Used  . Alcohol Use: No   OB History    No data available     Review of Systems  Constitutional: Negative for fever, chills, diaphoresis, appetite change and fatigue.  HENT: Negative for mouth sores, sore throat and trouble swallowing.   Eyes: Negative for visual disturbance.  Respiratory: Negative for cough, chest tightness, shortness of breath and wheezing.   Cardiovascular: Negative for chest pain.  Gastrointestinal: Positive for abdominal pain. Negative for nausea, vomiting, diarrhea and abdominal distention.  Endocrine: Negative for polydipsia, polyphagia and polyuria.  Genitourinary: Positive for dysuria and hematuria. Negative for urgency and frequency.  Musculoskeletal: Positive for back pain. Negative for gait problem.  Skin: Negative for color change, pallor and rash.  Neurological: Negative for dizziness, syncope, light-headedness and headaches.  Hematological: Does not bruise/bleed easily.  Psychiatric/Behavioral: Negative for behavioral problems and confusion.      Allergies  Penicillins  Home Medications   Prior to Admission medications   Medication Sig Start Date End Date Taking? Authorizing Provider  cephALEXin (KEFLEX) 500 MG capsule Take 1 capsule (500 mg total) by mouth 4 (four) times daily. Patient not taking: Reported on 03/05/2015 02/05/15   Elson Areas, PA-C  ciprofloxacin (CIPRO) 500 MG tablet Take 1 tablet (500 mg total) by mouth every 12 (twelve) hours. 03/05/15   Loraine Leriche  Fayrene FearingJames, MD  HYDROcodone-acetaminophen (NORCO/VICODIN) 5-325 MG tablet Take 1 tablet by mouth every 4 (four) hours as needed. 03/05/15   Rolland PorterMark Emanuela Runnion, MD  ondansetron (ZOFRAN ODT) 4 MG disintegrating tablet Take 1 tablet (4 mg total) by mouth every 8 (eight) hours as needed for nausea. 03/05/15   Rolland PorterMark Barbarann Kelly, MD  phenazopyridine (PYRIDIUM) 200 MG tablet Take 1 tablet (200 mg total) by mouth 3 (three) times daily. 03/05/15   Rolland PorterMark Kaneshia Cater, MD   BP 122/76  mmHg  Pulse 95  Temp(Src) 98.3 F (36.8 C) (Oral)  Resp 20  Ht 5' (1.524 m)  Wt 204 lb (92.534 kg)  BMI 39.84 kg/m2  SpO2 100% Physical Exam  Constitutional: She is oriented to person, place, and time. She appears well-developed and well-nourished. No distress.  HENT:  Head: Normocephalic.  Eyes: Conjunctivae are normal. Pupils are equal, round, and reactive to light. No scleral icterus.  Neck: Normal range of motion. Neck supple. No thyromegaly present.  Cardiovascular: Normal rate and regular rhythm.  Exam reveals no gallop and no friction rub.   No murmur heard. Pulmonary/Chest: Effort normal and breath sounds normal. No respiratory distress. She has no wheezes. She has no rales.  Abdominal: Soft. Bowel sounds are normal. She exhibits no distension. There is no tenderness. There is no rebound.    Musculoskeletal: Normal range of motion.       Back:  Neurological: She is alert and oriented to person, place, and time.  Skin: Skin is warm and dry. No rash noted.  Psychiatric: She has a normal mood and affect. Her behavior is normal.    ED Course  Procedures (including critical care time) Labs Review Labs Reviewed  URINALYSIS, ROUTINE W REFLEX MICROSCOPIC (NOT AT [facility]) - Abnormal; Notable for the following:    APPearance HAZY (*)    Hgb urine dipstick LARGE (*)    Protein, ur 30 (*)    Leukocytes, UA SMALL (*)    All other components within normal limits  URINE MICROSCOPIC-ADD ON - Abnormal; Notable for the following:    Squamous Epithelial / LPF FEW (*)    All other components within normal limits  PREGNANCY, URINE    Imaging Review No results found. I have personally reviewed and evaluated these images and lab results as part of my medical decision-making.   EKG Interpretation None      MDM   Final diagnoses:  Pyelonephritis    Patient does not appear septic or toxic. Afebrile here. Benign abdomen. Given ODT Zofran by mouth Vicodin. Also Pyridium and  Cipro. Ten-day course Cipro. Recheck any worsening or failure to improve at home.    Rolland PorterMark Shaurya Rawdon, MD 03/05/15 [phone]

## 2015-03-05 NOTE — ED Notes (Signed)
Pt c/o painful urination, hematuria, abd pain, and lower back pain since yesterday.  Reports had headache and dizziness yesterday, none today.

## 2015-03-05 NOTE — Discharge Instructions (Signed)

## 2015-07-10 ENCOUNTER — Emergency Department (HOSPITAL_COMMUNITY)
Admission: EM | Admit: 2015-07-10 | Discharge: 2015-07-10 | Disposition: A | Discharge status: Home / Self Care | Payer: Medicaid Other | Attending: Emergency Medicine | Admitting: Emergency Medicine

## 2015-07-10 ENCOUNTER — Encounter (HOSPITAL_COMMUNITY): Payer: Self-pay | Admitting: Emergency Medicine

## 2015-07-10 DIAGNOSIS — N12 Tubulo-interstitial nephritis, not specified as acute or chronic: Secondary | ICD-10-CM | POA: Diagnosis not present

## 2015-07-10 DIAGNOSIS — Z792 Long term (current) use of antibiotics: Secondary | ICD-10-CM | POA: Diagnosis not present

## 2015-07-10 DIAGNOSIS — Z79899 Other long term (current) drug therapy: Secondary | ICD-10-CM | POA: Insufficient documentation

## 2015-07-10 DIAGNOSIS — Z9089 Acquired absence of other organs: Secondary | ICD-10-CM | POA: Diagnosis not present

## 2015-07-10 DIAGNOSIS — M545 Low back pain: Secondary | ICD-10-CM | POA: Diagnosis present

## 2015-07-10 LAB — POC URINE PREG, ED: PREG TEST UR: NEGATIVE

## 2015-07-10 LAB — URINALYSIS, ROUTINE W REFLEX MICROSCOPIC
Bilirubin Urine: NEGATIVE
Glucose, UA: NEGATIVE mg/dL
KETONES UR: NEGATIVE mg/dL
Nitrite: NEGATIVE
PROTEIN: 100 mg/dL — AB
Specific Gravity, Urine: 1.025 (ref 1.005–1.030)
pH: 6 (ref 5.0–8.0)

## 2015-07-10 LAB — URINE MICROSCOPIC-ADD ON: Squamous Epithelial / LPF: NONE SEEN

## 2015-07-10 MED ORDER — PHENAZOPYRIDINE HCL 100 MG PO TABS
200.0000 mg | ORAL_TABLET | Freq: Once | ORAL | Status: AC
  Administered 2015-07-10: 200 mg via ORAL
  Filled 2015-07-10: qty 2

## 2015-07-10 MED ORDER — PHENAZOPYRIDINE HCL 200 MG PO TABS: 200.0000 mg | ORAL_TABLET | Freq: Three times a day (TID) | ORAL | Status: DC

## 2015-07-10 MED ORDER — CEPHALEXIN 500 MG PO CAPS: 500.0000 mg | ORAL_CAPSULE | Freq: Four times a day (QID) | ORAL | Status: DC

## 2015-07-10 MED ORDER — IBUPROFEN 800 MG PO TABS: 800.0000 mg | ORAL_TABLET | Freq: Three times a day (TID) | ORAL | Status: DC

## 2015-07-10 MED ORDER — IBUPROFEN 800 MG PO TABS
800.0000 mg | ORAL_TABLET | Freq: Once | ORAL | Status: AC
  Administered 2015-07-10: 800 mg via ORAL
  Filled 2015-07-10: qty 1

## 2015-07-10 MED ORDER — CEPHALEXIN 500 MG PO CAPS
500.0000 mg | ORAL_CAPSULE | Freq: Once | ORAL | Status: AC
  Administered 2015-07-10: 500 mg via ORAL
  Filled 2015-07-10: qty 1

## 2015-07-10 NOTE — Discharge Instructions (Signed)
Pyelonephritis, Adult °Pyelonephritis is a kidney infection. The kidneys are organs that help clean your blood by moving waste out of your blood and into your pee (urine). This infection can happen quickly, or it can last for a long time. In most cases, it clears up with treatment and does not cause other problems. °HOME CARE °Medicines °· Take over-the-counter and prescription medicines only as told by your doctor. °· Take your antibiotic medicine as told by your doctor. Do not stop taking the medicine even if you start to feel better. °General Instructions °· Drink enough fluid to keep your pee clear or pale yellow. °· Avoid caffeine, tea, and carbonated drinks. °· Pee (urinate) often. Avoid holding in pee for long periods of time. °· Pee before and after sex. °· After pooping (having a bowel movement), women should wipe from front to back. Use each tissue only once. °· Keep all follow-up visits as told by your doctor. This is important. °GET HELP IF: °· You do not feel better after 2 days. °· Your symptoms get worse. °· You have a fever. °GET HELP RIGHT AWAY IF: °· You cannot take your medicine or drink fluids as told. °· You have chills and shaking. °· You throw up (vomit). °· You have very bad pain in your side (flank) or back. °· You feel very weak or you pass out (faint). °  °This information is not intended to replace advice given to you by your health care provider. Make sure you discuss any questions you have with your health care provider. °  °Document Released: 06/04/2004 Document Revised: 01/16/2015 Document Reviewed: 08/20/2014 °Elsevier Interactive Patient Education ©2016 [employer] Inc[employer] ° °

## 2015-07-10 NOTE — ED Notes (Signed)
C/o pressure when voiding and lower back pain, rates pain 8/10.

## 2015-07-10 NOTE — ED Provider Notes (Signed)
CSN: [id number]     Arrival date & time 07/10/15  0802 History   First MD Initiated Contact with Patient 07/10/15 [phone]     Chief Complaint  Patient presents with  . Back Pain     (Consider location/radiation/quality/duration/timing/severity/associated sxs/prior Treatment) HPI   Kimberly May is a 25 y.o. female who presents to the Emergency Department complaining of low back pain and dysuria.  Symptoms present for urinary frequency, pressure with voiding and back pain. Onset of symptoms yesterday.  She reports hx of frequent UTIs and previous pyelonephritis.  Sx's feel similar to previous.  She reports bilateral mid back pain with left greater than right.  She has not taken any medications for symptom relief.  Nothing makes the pain better or worse.  She denies fever, vomiting, vaginal bleeding or discharge.     Past Medical History  Diagnosis Date  . Tuberculosis 15 YRS AGO     FATHER HAD TB , PT WAS MED [place]   Past Surgical History  Procedure Laterality Date  . Tympanostomy tube placement  DONE TWICE     BILAT    [facility])  . Cesarean section  2009   . Tonsillectomy  04/10/2011    Procedure: TONSILLECTOMY;  Surgeon: Antony Contras;  Location: [facility] OR;  Service: ENT;  Laterality: Bilateral;  . Incision and drainage abscess N/A 06/04/2013    Procedure: INCISION AND DRAINAGE PHARYNGEAL ABSCESS;  Surgeon: Melvenia Beam, MD;  Location: [facility] OR;  Service: ENT;  Laterality: N/A;  . Adenoidectomy    . Incision and drainage of peritonsillar abcess Left 06/09/2013    Procedure: INCISION AND DRAINAGE OF left retropharyneal abscess;  Surgeon: Flo Shanks, MD;  Location: [facility] OR;  Service: ENT;  Laterality: Left;  . Tracheostomy tube placement N/A 06/14/2013    Procedure: TRACHEOSTOMY;  Surgeon: Flo Shanks, MD;  Location: [facility] OR;  Service: ENT;  Laterality: N/A;  . Direct laryngoscopy N/A 06/14/2013    Procedure: DIRECT LARYNGOSCOPY/RE-EXPLORATORY OF NECK;  Surgeon: Flo Shanks, MD;  Location:  [facility] OR;  Service: ENT;  Laterality: N/A;  . Tonsillectomy     Family History  Problem Relation Age of Onset  . Cancer Father     larynx  . Other Paternal Grandfather     colon tumor  . Thyroid disease Paternal Grandmother   . Cancer Maternal Grandmother     hodgkins lymphoma, cervical  . Heart disease Maternal Grandmother    Social History  Substance Use Topics  . Smoking status: Never Smoker   . Smokeless tobacco: Never Used  . Alcohol Use: No   OB History    No data available     Review of Systems  Constitutional: Negative for fever.  Respiratory: Negative for shortness of breath.   Gastrointestinal: Negative for nausea, vomiting, abdominal pain and constipation.  Genitourinary: Positive for dysuria, frequency and decreased urine volume. Negative for hematuria, flank pain, vaginal bleeding, vaginal discharge, difficulty urinating, vaginal pain and menstrual problem.  Musculoskeletal: Positive for back pain. Negative for joint swelling.  Skin: Negative for rash.  Neurological: Negative for weakness and numbness.  All other systems reviewed and are negative.     Allergies  Penicillins  Home Medications   Prior to Admission medications   Medication Sig Start Date End Date Taking? Authorizing Provider  cephALEXin (KEFLEX) 500 MG capsule Take 1 capsule (500 mg total) by mouth 4 (four) times daily. Patient not taking: Reported on 03/05/2015 02/05/15   Elson Areas, PA-C  ciprofloxacin (  CIPRO) 500 MG tablet Take 1 tablet (500 mg total) by mouth every 12 (twelve) hours. 03/05/15   Rolland Porter, MD  HYDROcodone-acetaminophen (NORCO/VICODIN) 5-325 MG tablet Take 1 tablet by mouth every 4 (four) hours as needed. 03/05/15   Rolland Porter, MD  ondansetron (ZOFRAN ODT) 4 MG disintegrating tablet Take 1 tablet (4 mg total) by mouth every 8 (eight) hours as needed for nausea. 03/05/15   Rolland Porter, MD  phenazopyridine (PYRIDIUM) 200 MG tablet Take 1 tablet (200 mg total) by mouth 3  (three) times daily. 03/05/15   Rolland Porter, MD   BP 125/107 mmHg  Pulse 113  Temp(Src) 97.9 F (36.6 C) (Oral)  Resp 20  Ht 5' (1.524 m)  Wt 88.905 kg  BMI 38.28 kg/m2  SpO2 97% Physical Exam  Constitutional: She is oriented to person, place, and time. She appears well-developed and well-nourished. No distress.  HENT:  Head: Normocephalic and atraumatic.  Neck: Normal range of motion. Neck supple.  Cardiovascular: Normal rate, regular rhythm, normal heart sounds and intact distal pulses.   No murmur heard. Pulmonary/Chest: Effort normal and breath sounds normal. No respiratory distress.  Abdominal: Soft. Normal appearance. She exhibits no distension. There is tenderness in the suprapubic area. There is CVA tenderness. There is no rigidity, no guarding and no tenderness at McBurney's point.  Mild CVA tenderness bilaterally , left > right  Musculoskeletal: She exhibits tenderness. She exhibits no edema.       Lumbar back: She exhibits tenderness and pain. She exhibits normal range of motion, no swelling, no deformity, no laceration and normal pulse.  No spinal tenderness.  DP pulses are brisk and symmetrical.  Distal sensation intact.  Pt has 5/5 strength against resistance of bilateral lower extremities.     Neurological: She is alert and oriented to person, place, and time. She has normal strength. No sensory deficit. She exhibits normal muscle tone. Coordination and gait normal.  Reflex Scores:      Patellar reflexes are 2+ on the right side and 2+ on the left side.      Achilles reflexes are 2+ on the right side and 2+ on the left side. Skin: Skin is warm and dry. No rash noted.  Nursing note and vitals reviewed.   ED Course  Procedures (including critical care time) Labs Review Labs Reviewed  URINALYSIS, ROUTINE W REFLEX MICROSCOPIC (NOT AT [facility]) - Abnormal; Notable for the following:    APPearance TURBID (*)    Hgb urine dipstick LARGE (*)    Protein, ur 100 (*)     Leukocytes, UA MODERATE (*)    All other components within normal limits  URINE MICROSCOPIC-ADD ON - Abnormal; Notable for the following:    Bacteria, UA FEW (*)    All other components within normal limits  URINE CULTURE  POC URINE PREG, ED    Imaging Review No results found. I have personally reviewed and evaluated these images and lab results as part of my medical decision-making.   EKG Interpretation None      MDM   Final diagnoses:  Pyelonephritis   Pt is ambulatory, no focal neuro deficits.  Mild bilateral CVA tenderness on exam w/o fever, chills, or vomiting.  Hx of pyelonephritis.  Sx's c/w UTI and possible early developing pyelo.  She is well appearing, non-toxic.  Agrees to Keflex and pyridium, increased water intake and completion of the antibiotic as written.  Also close PMD f/u or ER return if she develops vomiting, fever or  increasing pain.    Urine culture pending  Pt verbalized understanding and agrees to care plan.  Appears stable for d/c  Pauline Aus, PA-C 07/10/15 9528  Margarita Grizzle, MD 07/10/15 [phone]

## 2015-07-12 LAB — URINE CULTURE: Culture: >=100000

## 2016-07-21 ENCOUNTER — Other Ambulatory Visit: Payer: Self-pay | Admitting: Adult Health

## 2016-07-22 ENCOUNTER — Ambulatory Visit: Payer: Self-pay | Admitting: Obstetrics & Gynecology

## 2016-07-30 ENCOUNTER — Encounter: Payer: Self-pay | Admitting: Adult Health

## 2016-07-30 ENCOUNTER — Ambulatory Visit (INDEPENDENT_AMBULATORY_CARE_PROVIDER_SITE_OTHER): Payer: Medicaid Other | Admitting: Adult Health

## 2016-07-30 ENCOUNTER — Other Ambulatory Visit (HOSPITAL_COMMUNITY)
Admission: RE | Admit: 2016-07-30 | Discharge: 2016-07-30 | Disposition: A | Discharge status: Home / Self Care | Payer: Medicaid Other | Source: Ambulatory Visit | Attending: Adult Health | Admitting: Adult Health

## 2016-07-30 VITALS — BP 140/84 | HR 86 | Ht 60.0 in | Wt 243.0 lb

## 2016-07-30 DIAGNOSIS — Z308 Encounter for other contraceptive management: Secondary | ICD-10-CM | POA: Diagnosis not present

## 2016-07-30 DIAGNOSIS — Z3009 Encounter for other general counseling and advice on contraception: Secondary | ICD-10-CM | POA: Insufficient documentation

## 2016-07-30 DIAGNOSIS — Z975 Presence of (intrauterine) contraceptive device: Secondary | ICD-10-CM

## 2016-07-30 DIAGNOSIS — Z01419 Encounter for gynecological examination (general) (routine) without abnormal findings: Secondary | ICD-10-CM

## 2016-07-30 NOTE — Progress Notes (Signed)
Patient ID: Kimberly May, female   DOB: 11/18/1990, 26 y.o.   MRN: [id number] History of Present Illness: Kimberly is a 26 year old white female, engaged, G1P1, in for a well woman gyn exam and pap, she has family planning medicaid.    Current Medications, Allergies, Past Medical History, Past Surgical History, Family History and Social History were reviewed in [facility] electronic medical record.     Review of Systems: Patient denies any headaches, hearing loss, fatigue, blurred vision, shortness of breath, chest pain, abdominal pain, problems with bowel movements, urination, or intercourse. No joint pain or mood swings.Wants IUD removed, wants another child.No periods with IUD.    Physical Exam:BP 140/84 (BP Location: Left Arm, Patient Position: Sitting, Cuff Size: Large)   Pulse 86   Ht 5' (1.524 m)   Wt 243 lb (110.2 kg)   BMI 47.46 kg/m  General:  Well developed, well nourished, no acute distress Skin:  Warm and dry Neck:  Midline trachea, normal thyroid, good ROM, no lymphadenopathy Lungs; Clear to auscultation bilaterally Breast:  No dominant palpable mass, retraction, or nipple discharge Cardiovascular: Regular rate and rhythm Abdomen:  Soft, non tender, no hepatosplenomegaly Pelvic:  External genitalia is normal in appearance, no lesions.  The vagina is normal in appearance. Urethra has no lesions or masses. The cervix is bulbous,everted at os, +IUD strings, pap with GC/CHL performed.  Uterus is felt to be normal size, shape, and contour.  No adnexal masses or tenderness noted.Bladder is non tender, no masses felt. Extremities/musculoskeletal:  No swelling or varicosities noted, no clubbing or cyanosis Psych:  No mood changes, alert and cooperative,seems happy PHQ 2 score 0.Discussed will remove IUD tomorrow, and will Rx prenatal vitamins then and could take 6-18 months of active trying to get pregnant or could happen first time of unprotected sex.   Impression: 1.  Encounter for routine gynecological examination with Papanicolaou smear of cervix   2. Family planning   3. IUD (intrauterine device) in place       Plan: Check HIV and RPR Return in 1 day for IUD removal Physical in 1 year, pap in 3 if normal

## 2016-07-31 ENCOUNTER — Encounter: Payer: Self-pay | Admitting: Adult Health

## 2016-07-31 ENCOUNTER — Ambulatory Visit (INDEPENDENT_AMBULATORY_CARE_PROVIDER_SITE_OTHER): Payer: Medicaid Other | Admitting: Adult Health

## 2016-07-31 VITALS — BP 120/72 | HR 92 | Ht 60.0 in | Wt 244.0 lb

## 2016-07-31 DIAGNOSIS — Z30432 Encounter for removal of intrauterine contraceptive device: Secondary | ICD-10-CM

## 2016-07-31 DIAGNOSIS — Z3049 Encounter for surveillance of other contraceptives: Secondary | ICD-10-CM | POA: Diagnosis not present

## 2016-07-31 LAB — RPR: RPR: NONREACTIVE

## 2016-07-31 LAB — CYTOLOGY - PAP
Chlamydia: POSITIVE — AB
DIAGNOSIS: NEGATIVE
Neisseria Gonorrhea: NEGATIVE

## 2016-07-31 LAB — HIV ANTIBODY (ROUTINE TESTING W REFLEX): HIV SCREEN 4TH GENERATION: NONREACTIVE

## 2016-07-31 MED ORDER — FLINTSTONES COMPLETE 60 MG PO CHEW: CHEWABLE_TABLET | ORAL | Status: DC

## 2016-07-31 NOTE — Progress Notes (Signed)
Subjective:     Patient ID: Kimberly May, female   DOB: 02/07/1991, 26 y.o.   MRN: [id number]  HPI Kimberly is a 26 year old white female in for IUD removal.  Review of Systems For IUD removal Reviewed past medical,surgical, social and family history. Reviewed medications and allergies.     Objective:   Physical Exam BP 120/72 (BP Location: Left Arm, Patient Position: Sitting, Cuff Size: Large)   Pulse 92   Ht 5' (1.524 m)   Wt 244 lb (110.7 kg)   BMI 47.65 kg/m Consent signed for IUD removal.   Skin warm and dry.Pelvic: external genitalia is normal in appearance no lesions, vagina: normal in appearance,urethra has no lesions or masses noted, cervix: bulbous, IUD strings visualized and forceps used to grasp strings and pt asked to cough and IUD easily removed. Assessment:     IUD removal    Plan:     Chew up 2 Flintstones daily Follow up prn

## 2016-08-03 ENCOUNTER — Encounter: Payer: Self-pay | Admitting: Adult Health

## 2016-08-03 ENCOUNTER — Telehealth: Payer: Self-pay | Admitting: Adult Health

## 2016-08-03 DIAGNOSIS — A749 Chlamydial infection, unspecified: Secondary | ICD-10-CM

## 2016-08-03 HISTORY — DX: Chlamydial infection, unspecified: A74.9

## 2016-08-03 MED ORDER — AZITHROMYCIN 500 MG PO TABS: ORAL_TABLET | ORAL | 0 refills | Status: DC

## 2016-08-03 NOTE — Telephone Encounter (Signed)
No voice mail.

## 2016-08-03 NOTE — Telephone Encounter (Signed)
Pt aware that pap is normal and GC was negative, but +chlamydia, will treat her with azitromycin 500 mg #2 take 2 po now and also partner, Abelardo DieselDevin Daniels, DOB 07/10/97 with the same.No sex for 4 weeks, POC 4/23 at 4 pm, [facility] sent.

## 2016-08-14 ENCOUNTER — Emergency Department (HOSPITAL_COMMUNITY): Payer: No Typology Code available for payment source

## 2016-08-14 ENCOUNTER — Encounter (HOSPITAL_COMMUNITY): Payer: Self-pay | Admitting: Emergency Medicine

## 2016-08-14 ENCOUNTER — Emergency Department (HOSPITAL_COMMUNITY)
Admission: EM | Admit: 2016-08-14 | Discharge: 2016-08-14 | Disposition: A | Discharge status: Home / Self Care | Payer: No Typology Code available for payment source | Attending: Emergency Medicine | Admitting: Emergency Medicine

## 2016-08-14 DIAGNOSIS — S7012XA Contusion of left thigh, initial encounter: Secondary | ICD-10-CM | POA: Diagnosis not present

## 2016-08-14 DIAGNOSIS — Y999 Unspecified external cause status: Secondary | ICD-10-CM | POA: Diagnosis not present

## 2016-08-14 DIAGNOSIS — S301XXA Contusion of abdominal wall, initial encounter: Secondary | ICD-10-CM

## 2016-08-14 DIAGNOSIS — Z91040 Latex allergy status: Secondary | ICD-10-CM | POA: Insufficient documentation

## 2016-08-14 DIAGNOSIS — Y939 Activity, unspecified: Secondary | ICD-10-CM | POA: Insufficient documentation

## 2016-08-14 DIAGNOSIS — S2232XA Fracture of one rib, left side, initial encounter for closed fracture: Secondary | ICD-10-CM | POA: Insufficient documentation

## 2016-08-14 DIAGNOSIS — S20212A Contusion of left front wall of thorax, initial encounter: Secondary | ICD-10-CM | POA: Diagnosis not present

## 2016-08-14 DIAGNOSIS — R000 Tachycardia, unspecified: Secondary | ICD-10-CM | POA: Diagnosis not present

## 2016-08-14 DIAGNOSIS — S20219A Contusion of unspecified front wall of thorax, initial encounter: Secondary | ICD-10-CM

## 2016-08-14 DIAGNOSIS — Y92410 Unspecified street and highway as the place of occurrence of the external cause: Secondary | ICD-10-CM | POA: Insufficient documentation

## 2016-08-14 DIAGNOSIS — S299XXA Unspecified injury of thorax, initial encounter: Secondary | ICD-10-CM | POA: Diagnosis present

## 2016-08-14 LAB — CBC WITH DIFFERENTIAL/PLATELET
BASOS PCT: 0 %
Basophils Absolute: 0 10*3/uL (ref 0.0–0.1)
Eosinophils Absolute: 0 10*3/uL (ref 0.0–0.7)
Eosinophils Relative: 0 %
HCT: 39.3 % (ref 36.0–46.0)
Hemoglobin: 13.3 g/dL (ref 12.0–15.0)
LYMPHS PCT: 7 %
Lymphs Abs: 1.3 10*3/uL (ref 0.7–4.0)
MCH: 28.2 pg (ref 26.0–34.0)
MCHC: 33.8 g/dL (ref 30.0–36.0)
MCV: 83.3 fL (ref 78.0–100.0)
MONO ABS: 1.4 10*3/uL — AB (ref 0.1–1.0)
Monocytes Relative: 8 %
Neutro Abs: 15.2 10*3/uL — ABNORMAL HIGH (ref 1.7–7.7)
Neutrophils Relative %: 85 %
Platelets: 259 10*3/uL (ref 150–400)
RBC: 4.72 MIL/uL (ref 3.87–5.11)
RDW: 13.3 % (ref 11.5–15.5)
WBC: 17.9 10*3/uL — AB (ref 4.0–10.5)

## 2016-08-14 LAB — I-STAT BETA HCG BLOOD, ED (MC, WL, AP ONLY): I-stat hCG, quantitative: <5 m[IU]/mL (ref <5)

## 2016-08-14 LAB — I-STAT CHEM 8, ED
BUN: 14 mg/dL (ref 6–20)
CHLORIDE: 105 mmol/L (ref 101–111)
CREATININE: 0.8 mg/dL (ref 0.44–1.00)
Calcium, Ion: 1.13 mmol/L — ABNORMAL LOW (ref 1.15–1.40)
Glucose, Bld: 139 mg/dL — ABNORMAL HIGH (ref 65–99)
HEMATOCRIT: 40 % (ref 36.0–46.0)
Hemoglobin: 13.6 g/dL (ref 12.0–15.0)
Potassium: 4.3 mmol/L (ref 3.5–5.1)
SODIUM: 136 mmol/L (ref 135–145)
TCO2: 26 mmol/L (ref 0–100)

## 2016-08-14 LAB — I-STAT TROPONIN, ED: Troponin i, poc: 0 ng/mL (ref 0.00–0.08)

## 2016-08-14 MED ORDER — MORPHINE SULFATE (PF) 4 MG/ML IV SOLN
6.0000 mg | Freq: Once | INTRAVENOUS | Status: AC
  Administered 2016-08-14: 6 mg via INTRAVENOUS
  Filled 2016-08-14: qty 2

## 2016-08-14 MED ORDER — NAPROXEN 500 MG PO TABS: 500.0000 mg | ORAL_TABLET | Freq: Two times a day (BID) | ORAL | 0 refills | Status: DC

## 2016-08-14 MED ORDER — METHOCARBAMOL 500 MG PO TABS
750.0000 mg | ORAL_TABLET | Freq: Once | ORAL | Status: AC
  Administered 2016-08-14: 750 mg via ORAL
  Filled 2016-08-14: qty 2

## 2016-08-14 MED ORDER — SODIUM CHLORIDE 0.9 % IV BOLUS (SEPSIS)
1000.0000 mL | Freq: Once | INTRAVENOUS | Status: AC
  Administered 2016-08-14: 1000 mL via INTRAVENOUS

## 2016-08-14 MED ORDER — IOPAMIDOL (ISOVUE-300) INJECTION 61%
INTRAVENOUS | Status: AC
  Administered 2016-08-14: 100 mL
  Filled 2016-08-14: qty 100

## 2016-08-14 MED ORDER — ONDANSETRON 4 MG PO TBDP
8.0000 mg | ORAL_TABLET | Freq: Once | ORAL | Status: AC
  Administered 2016-08-14: 8 mg via ORAL
  Filled 2016-08-14: qty 2

## 2016-08-14 MED ORDER — LORAZEPAM 2 MG/ML IJ SOLN
0.5000 mg | Freq: Once | INTRAMUSCULAR | Status: AC
  Administered 2016-08-14: 0.5 mg via INTRAVENOUS
  Filled 2016-08-14: qty 1

## 2016-08-14 MED ORDER — HYDROCODONE-ACETAMINOPHEN 5-325 MG PO TABS: 1.0000 | ORAL_TABLET | Freq: Four times a day (QID) | ORAL | 0 refills | Status: DC | PRN

## 2016-08-14 MED ORDER — KETOROLAC TROMETHAMINE 15 MG/ML IJ SOLN
15.0000 mg | Freq: Once | INTRAMUSCULAR | Status: AC
  Administered 2016-08-14: 15 mg via INTRAVENOUS
  Filled 2016-08-14: qty 1

## 2016-08-14 MED ORDER — OXYCODONE-ACETAMINOPHEN 5-325 MG PO TABS
1.0000 | ORAL_TABLET | Freq: Once | ORAL | Status: AC
  Administered 2016-08-14: 1 via ORAL
  Filled 2016-08-14: qty 1

## 2016-08-14 MED ORDER — METHOCARBAMOL 500 MG PO TABS: 500.0000 mg | ORAL_TABLET | Freq: Two times a day (BID) | ORAL | 0 refills | Status: DC
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
## 2016-08-14 NOTE — Discharge Instructions (Signed)
Take pain medications as prescribed as needed. Try ice/heat. Rest. Follow up with family doctor if not improving in 3-5 days. Follow up to make sure your heart rate improves. Return if worsening symptoms.

## 2016-08-14 NOTE — ED Notes (Signed)
Patient transported to CT 

## 2016-08-31 ENCOUNTER — Ambulatory Visit: Payer: Medicaid Other | Admitting: Adult Health

## 2017-07-20 ENCOUNTER — Encounter: Payer: Self-pay | Admitting: Adult Health

## 2017-07-27 ENCOUNTER — Encounter: Payer: Self-pay | Admitting: Adult Health

## 2017-07-29 ENCOUNTER — Encounter: Payer: Self-pay | Admitting: Adult Health

## 2017-12-01 ENCOUNTER — Ambulatory Visit: Payer: Medicaid Other | Admitting: Adult Health

## 2017-12-01 ENCOUNTER — Encounter: Payer: Self-pay | Admitting: Adult Health

## 2017-12-01 ENCOUNTER — Other Ambulatory Visit: Payer: Self-pay

## 2017-12-01 VITALS — BP 127/86 | HR 90 | Ht 60.0 in | Wt 248.0 lb

## 2017-12-01 DIAGNOSIS — Z3202 Encounter for pregnancy test, result negative: Secondary | ICD-10-CM

## 2017-12-01 DIAGNOSIS — N926 Irregular menstruation, unspecified: Secondary | ICD-10-CM | POA: Diagnosis not present

## 2017-12-01 DIAGNOSIS — Z319 Encounter for procreative management, unspecified: Secondary | ICD-10-CM

## 2017-12-01 LAB — POCT URINE PREGNANCY: Preg Test, Ur: NEGATIVE

## 2017-12-01 MED ORDER — PRENATAL GUMMIES/DHA & FA 0.4-32.5 MG PO CHEW: 2.0000 | CHEWABLE_TABLET | Freq: Every day | ORAL | Status: DC

## 2017-12-01 NOTE — Progress Notes (Signed)
  Subjective:     Patient ID: Kimberly May, female   DOB: [birth year], 27 y.o.   MRN: [id number]  HPI Kimberly is a 27 year old white female in to discuss periods and getting pregnant.   Review of Systems Period cycles varying,and has missed periods Desires pregnancy Reviewed past medical,surgical, social and family history. Reviewed medications and allergies.     Objective:   Physical Exam BP 127/86 (BP Location: Right Arm, Patient Position: Sitting, Cuff Size: Large)   Pulse 90   Ht 5' (1.524 m)   Wt 248 lb (112.5 kg)   LMP 10/11/2017   BMI 48.43 kg/m UPT negative.Skin warm and dry. Lungs: clear to ausculation bilaterally. Cardiovascular: regular rate and rhythm.   Discussed period cycle, ovulation and timing of sex.If not ovulating, could try clomid.  Assessment:     1. Irregular periods   2. Patient desires pregnancy   3. Negative pregnancy test       Plan:     Take PNV Gummies Call when starts period to check progesterone level Review handout on preparing for pregnancy

## 2017-12-01 NOTE — Patient Instructions (Signed)
Preparing for Pregnancy If you are considering becoming pregnant, make an appointment to see your regular health care provider to learn how to prepare for a safe and healthy pregnancy (preconception care). During a preconception care visit, your health care provider will:  Do a complete physical exam, including a Pap test.  Take a complete medical history.  Give you information, answer your questions, and help you resolve problems.  Preconception checklist Medical history  Tell your health care provider about any current or past medical conditions. Your pregnancy or your ability to become pregnant may be affected by chronic conditions, such as diabetes, chronic hypertension, and thyroid problems.  Include your family's medical history as well as your partner's medical history.  Tell your health care provider about any history of STIs (sexually transmitted infections).These can affect your pregnancy. In some cases, they can be passed to your baby. Discuss any concerns that you have about STIs.  If indicated, discuss the benefits of genetic testing. This testing will show whether there are any genetic conditions that may be passed from you or your partner to your baby.  Tell your health care provider about: ? Any problems you have had with conception or pregnancy. ? Any medicines you take. These include vitamins, herbal supplements, and over-the-counter medicines. ? Your history of immunizations. Discuss any vaccinations that you may need.  Diet  Ask your health care provider what to include in a healthy diet that has a balance of nutrients. This is especially important when you are pregnant or preparing to become pregnant.  Ask your health care provider to help you reach a healthy weight before pregnancy. ? If you are overweight, you may be at higher risk for certain complications, such as high blood pressure, diabetes, and preterm birth. ? If you are underweight, you are more likely  to have a baby who has a low birth weight.  Lifestyle, work, and home  Let your health care provider know: ? About any lifestyle habits that you have, such as alcohol use, drug use, or smoking. ? About recreational activities that may put you at risk during pregnancy, such as downhill skiing and certain exercise programs. ? Tell your health care provider about any international travel, especially any travel to places with an active Zika virus outbreak. ? About harmful substances that you may be exposed to at work or at home. These include chemicals, pesticides, radiation, or even litter boxes. ? If you do not feel safe at home.  Mental health  Tell your health care provider about: ? Any history of mental health conditions, including feelings of depression, sadness, or anxiety. ? Any medicines that you take for a mental health condition. These include herbs and supplements.  Home instructions to prepare for pregnancy Lifestyle  Eat a balanced diet. This includes fresh fruits and vegetables, whole grains, lean meats, low-fat dairy products, healthy fats, and foods that are high in fiber. Ask to meet with a nutritionist or registered dietitian for assistance with meal planning and goals.  Get regular exercise. Try to be active for at least 30 minutes a day on most days of the week. Ask your health care provider which activities are safe during pregnancy.  Do not use any products that contain nicotine or tobacco, such as cigarettes and e-cigarettes. If you need help quitting, ask your health care provider.  Do not drink alcohol.  Do not take illegal drugs.  Maintain a healthy weight. Ask your health care provider what weight range is   right for you.  General instructions  Keep an accurate record of your menstrual periods. This makes it easier for your health care provider to determine your baby's due date.  Begin taking prenatal vitamins and folic acid supplements daily as directed by  your health care provider.  Manage any chronic conditions, such as high blood pressure and diabetes, as told by your health care provider. This is important.  How do I know that I am pregnant? You may be pregnant if you have been sexually active and you miss your period. Symptoms of early pregnancy include:  Mild cramping.  Very light vaginal bleeding (spotting).  Feeling unusually tired.  Nausea and vomiting (morning sickness).  If you have any of these symptoms and you suspect that you might be pregnant, you can take a home pregnancy test. These tests check for a hormone in your urine (human chorionic gonadotropin, or hCG). A woman's body begins to make this hormone during early pregnancy. These tests are very accurate. Wait until at least the first day after you miss your period to take one. If the test shows that you are pregnant (you get a positive result), call your health care provider to make an appointment for prenatal care. What should I do if I become pregnant?  Make an appointment with your health care provider as soon as you suspect you are pregnant.  Do not use any products that contain nicotine, such as cigarettes, chewing tobacco, and e-cigarettes. If you need help quitting, ask your health care provider.  Do not drink alcoholic beverages. Alcohol is related to a number of birth defects.  Avoid toxic odors and chemicals.  You may continue to have sexual intercourse if it does not cause pain or other problems, such as vaginal bleeding. This information is not intended to replace advice given to you by your health care provider. Make sure you discuss any questions you have with your health care provider. Document Released: 04/09/2008 Document Revised: 12/24/2015 Document Reviewed: 11/17/2015 Elsevier Interactive Patient Education  2018 [employer]  

## 2017-12-05 ENCOUNTER — Encounter: Payer: Self-pay | Admitting: Adult Health

## 2017-12-10 ENCOUNTER — Encounter: Payer: Self-pay | Admitting: Adult Health

## 2017-12-13 ENCOUNTER — Other Ambulatory Visit: Payer: Self-pay | Admitting: Adult Health

## 2017-12-13 ENCOUNTER — Other Ambulatory Visit: Payer: Medicaid Other

## 2017-12-13 DIAGNOSIS — Z319 Encounter for procreative management, unspecified: Secondary | ICD-10-CM

## 2017-12-13 DIAGNOSIS — N926 Irregular menstruation, unspecified: Secondary | ICD-10-CM

## 2017-12-13 NOTE — Progress Notes (Signed)
ck progesterone 8/22

## 2017-12-31 LAB — PROGESTERONE: Progesterone: 5.9 ng/mL

## 2018-03-10 DIAGNOSIS — R5383 Other fatigue: Secondary | ICD-10-CM | POA: Diagnosis not present

## 2018-03-10 DIAGNOSIS — E559 Vitamin D deficiency, unspecified: Secondary | ICD-10-CM | POA: Diagnosis not present

## 2018-03-10 DIAGNOSIS — N926 Irregular menstruation, unspecified: Secondary | ICD-10-CM | POA: Diagnosis not present

## 2018-03-14 ENCOUNTER — Other Ambulatory Visit (HOSPITAL_COMMUNITY): Payer: Self-pay | Admitting: Nurse Practitioner

## 2018-03-14 DIAGNOSIS — N6320 Unspecified lump in the left breast, unspecified quadrant: Secondary | ICD-10-CM

## 2018-03-15 ENCOUNTER — Ambulatory Visit (HOSPITAL_COMMUNITY)
Admission: RE | Admit: 2018-03-15 | Discharge: 2018-03-15 | Disposition: A | Discharge status: Home / Self Care | Payer: Self-pay | Source: Ambulatory Visit | Attending: Nurse Practitioner | Admitting: Nurse Practitioner

## 2018-03-15 DIAGNOSIS — N6320 Unspecified lump in the left breast, unspecified quadrant: Secondary | ICD-10-CM | POA: Insufficient documentation

## 2018-03-15 DIAGNOSIS — N6322 Unspecified lump in the left breast, upper inner quadrant: Secondary | ICD-10-CM | POA: Diagnosis not present

## 2018-04-26 ENCOUNTER — Encounter: Payer: Self-pay | Admitting: Adult Health

## 2018-04-26 ENCOUNTER — Ambulatory Visit: Payer: Medicaid Other | Admitting: Adult Health

## 2018-04-26 VITALS — BP 116/75 | HR 82 | Ht 60.0 in | Wt 245.0 lb

## 2018-04-26 DIAGNOSIS — O3680X0 Pregnancy with inconclusive fetal viability, not applicable or unspecified: Secondary | ICD-10-CM

## 2018-04-26 DIAGNOSIS — Z3A01 Less than 8 weeks gestation of pregnancy: Secondary | ICD-10-CM | POA: Insufficient documentation

## 2018-04-26 DIAGNOSIS — Z3201 Encounter for pregnancy test, result positive: Secondary | ICD-10-CM

## 2018-04-26 DIAGNOSIS — R110 Nausea: Secondary | ICD-10-CM

## 2018-04-26 DIAGNOSIS — N926 Irregular menstruation, unspecified: Secondary | ICD-10-CM | POA: Diagnosis not present

## 2018-04-26 LAB — POCT URINE PREGNANCY: Preg Test, Ur: POSITIVE — AB

## 2018-04-26 NOTE — Progress Notes (Signed)
Patient ID: Kimberly May Nicklin, female   DOB: 09-24-1990, 27 y.o.   MRN: [id number] History of Present Illness:  Kimberly May is a 27 year old white female, engaged, in for UPT, has missed period and had +HPTs, has nausea and not sleeping well at night. She has 27 year old son.   Current Medications, Allergies, Past Medical History, Past Surgical History, Family History and Social History were reviewed in [facility] electronic medical record.     Review of Systems: +missed period with +HPT +nausea Not sleeping well at night     Physical Exam:BP 116/75 (BP Location: Left Arm, Patient Position: Sitting, Cuff Size: Normal)   Pulse 82   Ht 5' (1.524 m)   Wt 245 lb (111.1 kg)   LMP 03/16/2018 (Exact Date)   BMI 47.85 kg/m +UPT, about 6 weeks by LMP with EDD 12/21/18. General:  Well developed, well nourished, no acute distress Skin:  Warm and dry Neck:  Midline trachea, normal thyroid, good ROM, no lymphadenopathy Lungs; Clear to auscultation bilaterally Cardiovascular: Regular rate and rhythm Abdomen:  Soft, non tender, no hepatosplenomegaly noted on palpation Psych:  No mood changes, alert and cooperative,seems happy,is excited about being pregnant.  PHQ 2 score 0. Fall risk is low. 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   Impression: 1. Pregnancy test positive   2. Less than [redacted] weeks gestation of pregnancy   3. Encounter to determine fetal viability of pregnancy, single or unspecified fetus       Plan: Continue PNV Eat often Return in 2 weeks for dating US and new OB in 4 weeks Review handouts on First trimester and by [facility]

## 2018-04-26 NOTE — Patient Instructions (Signed)
First Trimester of Pregnancy The first trimester of pregnancy is from week 1 until the end of week 13 (months 1 through 3). A week after a sperm fertilizes an egg, the egg will implant on the wall of the uterus. This embryo will begin to develop into a baby. Genes from you and your partner will form the baby. The female genes will determine whether the baby will be a boy or a girl. At 6-8 weeks, the eyes and face will be formed, and the heartbeat can be seen on ultrasound. At the end of 12 weeks, all the baby's organs will be formed. Now that you are pregnant, you will want to do everything you can to have a healthy baby. Two of the most important things are to get good prenatal care and to follow your health care provider's instructions. Prenatal care is all the medical care you receive before the baby's birth. This care will help prevent, find, and treat any problems during the pregnancy and childbirth. Body changes during your first trimester Your body goes through many changes during pregnancy. The changes vary from woman to woman.  You may gain or lose a couple of pounds at first.  You may feel sick to your stomach (nauseous) and you may throw up (vomit). If the vomiting is uncontrollable, call your health care provider.  You may tire easily.  You may develop headaches that can be relieved by medicines. All medicines should be approved by your health care provider.  You may urinate more often. Painful urination may mean you have a bladder infection.  You may develop heartburn as a result of your pregnancy.  You may develop constipation because certain hormones are causing the muscles that push stool through your intestines to slow down.  You may develop hemorrhoids or swollen veins (varicose veins).  Your breasts may begin to grow larger and become tender. Your nipples may stick out more, and the tissue that surrounds them (areola) may become darker.  Your gums may bleed and may be  sensitive to brushing and flossing.  Dark spots or blotches (chloasma, mask of pregnancy) may develop on your face. This will likely fade after the baby is born.  Your menstrual periods will stop.  You may have a loss of appetite.  You may develop cravings for certain kinds of food.  You may have changes in your emotions from day to day, such as being excited to be pregnant or being concerned that something may go wrong with the pregnancy and baby.  You may have more vivid and strange dreams.  You may have changes in your hair. These can include thickening of your hair, rapid growth, and changes in texture. Some women also have hair loss during or after pregnancy, or hair that feels dry or thin. Your hair will most likely return to normal after your baby is born.  What to expect at prenatal visits During a routine prenatal visit:  You will be weighed to make sure you and the baby are growing normally.  Your blood pressure will be taken.  Your abdomen will be measured to track your baby's growth.  The fetal heartbeat will be listened to between weeks 10 and 14 of your pregnancy.  Test results from any previous visits will be discussed.  Your health care provider may ask you:  How you are feeling.  If you are feeling the baby move.  If you have had any abnormal symptoms, such as leaking fluid, bleeding, severe headaches,   or abdominal cramping.  If you are using any tobacco products, including cigarettes, chewing tobacco, and electronic cigarettes.  If you have any questions.  Other tests that may be performed during your first trimester include:  Blood tests to find your blood type and to check for the presence of any previous infections. The tests will also be used to check for low iron levels (anemia) and protein on red blood cells (Rh antibodies). Depending on your risk factors, or if you previously had diabetes during pregnancy, you may have tests to check for high blood  sugar that affects pregnant women (gestational diabetes).  Urine tests to check for infections, diabetes, or protein in the urine.  An ultrasound to confirm the proper growth and development of the baby.  Fetal screens for spinal cord problems (spina bifida) and Down syndrome.  HIV (human immunodeficiency virus) testing. Routine prenatal testing includes screening for HIV, unless you choose not to have this test.  You may need other tests to make sure you and the baby are doing well.  Follow these instructions at home: Medicines  Follow your health care provider's instructions regarding medicine use. Specific medicines may be either safe or unsafe to take during pregnancy.  Take a prenatal vitamin that contains at least 600 micrograms (mcg) of folic acid.  If you develop constipation, try taking a stool softener if your health care provider approves. Eating and drinking  Eat a balanced diet that includes fresh fruits and vegetables, whole grains, good sources of protein such as meat, eggs, or tofu, and low-fat dairy. Your health care provider will help you determine the amount of weight gain that is right for you.  Avoid raw meat and uncooked cheese. These carry germs that can cause birth defects in the baby.  Eating four or five small meals rather than three large meals a day may help relieve nausea and vomiting. If you start to feel nauseous, eating a few soda crackers can be helpful. Drinking liquids between meals, instead of during meals, also seems to help ease nausea and vomiting.  Limit foods that are high in fat and processed sugars, such as fried and sweet foods.  To prevent constipation: ? Eat foods that are high in fiber, such as fresh fruits and vegetables, whole grains, and beans. ? Drink enough fluid to keep your urine clear or pale yellow. Activity  Exercise only as directed by your health care provider. Most women can continue their usual exercise routine during  pregnancy. Try to exercise for 30 minutes at least 5 days a week. Exercising will help you: ? Control your weight. ? Stay in shape. ? Be prepared for labor and delivery.  Experiencing pain or cramping in the lower abdomen or lower back is a good sign that you should stop exercising. Check with your health care provider before continuing with normal exercises.  Try to avoid standing for long periods of time. Move your legs often if you must stand in one place for a long time.  Avoid heavy lifting.  Wear low-heeled shoes and practice good posture.  You may continue to have sex unless your health care provider tells you not to. Relieving pain and discomfort  Wear a good support bra to relieve breast tenderness.  Take warm sitz baths to soothe any pain or discomfort caused by hemorrhoids. Use hemorrhoid cream if your health care provider approves.  Rest with your legs elevated if you have leg cramps or low back pain.  If you develop   varicose veins in your legs, wear support hose. Elevate your feet for 15 minutes, 3-4 times a day. Limit salt in your diet. Prenatal care  Schedule your prenatal visits by the twelfth week of pregnancy. They are usually scheduled monthly at first, then more often in the last 2 months before delivery.  Write down your questions. Take them to your prenatal visits.  Keep all your prenatal visits as told by your health care provider. This is important. Safety  Wear your seat belt at all times when driving.  Make a list of emergency phone numbers, including numbers for family, friends, the hospital, and police and fire departments. General instructions  Ask your health care provider for a referral to a local prenatal education class. Begin classes no later than the beginning of month 6 of your pregnancy.  Ask for help if you have counseling or nutritional needs during pregnancy. Your health care provider can offer advice or refer you to specialists for help  with various needs.  Do not use hot tubs, steam rooms, or saunas.  Do not douche or use tampons or scented sanitary pads.  Do not cross your legs for long periods of time.  Avoid cat litter boxes and soil used by cats. These carry germs that can cause birth defects in the baby and possibly loss of the fetus by miscarriage or stillbirth.  Avoid all smoking, herbs, alcohol, and medicines not prescribed by your health care provider. Chemicals in these products affect the formation and growth of the baby.  Do not use any products that contain nicotine or tobacco, such as cigarettes and e-cigarettes. If you need help quitting, ask your health care provider. You may receive counseling support and other resources to help you quit.  Schedule a dentist appointment. At home, brush your teeth with a soft toothbrush and be gentle when you floss. Contact a health care provider if:  You have dizziness.  You have mild pelvic cramps, pelvic pressure, or nagging pain in the abdominal area.  You have persistent nausea, vomiting, or diarrhea.  You have a bad smelling vaginal discharge.  You have pain when you urinate.  You notice increased swelling in your face, hands, legs, or ankles.  You are exposed to fifth disease or chickenpox.  You are exposed to German measles (rubella) and have never had it. Get help right away if:  You have a fever.  You are leaking fluid from your vagina.  You have spotting or bleeding from your vagina.  You have severe abdominal cramping or pain.  You have rapid weight gain or loss.  You vomit blood or material that looks like coffee grounds.  You develop a severe headache.  You have shortness of breath.  You have any kind of trauma, such as from a fall or a car accident. Summary  The first trimester of pregnancy is from week 1 until the end of week 13 (months 1 through 3).  Your body goes through many changes during pregnancy. The changes vary from  woman to woman.  You will have routine prenatal visits. During those visits, your health care provider will examine you, discuss any test results you may have, and talk with you about how you are feeling. This information is not intended to replace advice given to you by your health care provider. Make sure you discuss any questions you have with your health care provider. Document Released: 04/21/2001 Document Revised: 04/08/2016 Document Reviewed: 04/08/2016 Elsevier Interactive Patient Education  2018 [employer]   Inc.  

## 2018-04-28 ENCOUNTER — Encounter (HOSPITAL_COMMUNITY): Payer: Self-pay | Admitting: Emergency Medicine

## 2018-04-28 ENCOUNTER — Other Ambulatory Visit: Payer: Self-pay

## 2018-04-28 ENCOUNTER — Emergency Department (HOSPITAL_COMMUNITY)
Admission: EM | Admit: 2018-04-28 | Discharge: 2018-04-29 | Disposition: A | Discharge status: Home / Self Care | Payer: Medicaid Other | Attending: Emergency Medicine | Admitting: Emergency Medicine

## 2018-04-28 DIAGNOSIS — Z3A01 Less than 8 weeks gestation of pregnancy: Secondary | ICD-10-CM | POA: Diagnosis not present

## 2018-04-28 DIAGNOSIS — Z3200 Encounter for pregnancy test, result unknown: Secondary | ICD-10-CM | POA: Diagnosis not present

## 2018-04-28 DIAGNOSIS — N939 Abnormal uterine and vaginal bleeding, unspecified: Secondary | ICD-10-CM | POA: Diagnosis not present

## 2018-04-28 DIAGNOSIS — R102 Pelvic and perineal pain: Secondary | ICD-10-CM | POA: Diagnosis not present

## 2018-04-28 DIAGNOSIS — O200 Threatened abortion: Secondary | ICD-10-CM | POA: Diagnosis not present

## 2018-04-28 DIAGNOSIS — D72829 Elevated white blood cell count, unspecified: Secondary | ICD-10-CM | POA: Diagnosis not present

## 2018-04-28 DIAGNOSIS — R300 Dysuria: Secondary | ICD-10-CM | POA: Insufficient documentation

## 2018-04-28 DIAGNOSIS — E559 Vitamin D deficiency, unspecified: Secondary | ICD-10-CM | POA: Diagnosis not present

## 2018-04-28 DIAGNOSIS — Z91040 Latex allergy status: Secondary | ICD-10-CM | POA: Insufficient documentation

## 2018-04-28 DIAGNOSIS — O209 Hemorrhage in early pregnancy, unspecified: Secondary | ICD-10-CM | POA: Diagnosis not present

## 2018-04-28 DIAGNOSIS — Z3A00 Weeks of gestation of pregnancy not specified: Secondary | ICD-10-CM | POA: Diagnosis not present

## 2018-04-28 NOTE — ED Triage Notes (Signed)
Patient states she is 6 weeks and 2 days  pregnant and started spotting and bleeding yesterday. Patient has passed some clots tonight and complaining of lower back pain. Patient states she is only spotting when she wipes at this time and states that urination burns.

## 2018-04-29 ENCOUNTER — Ambulatory Visit: Payer: Medicaid Other | Admitting: Adult Health

## 2018-04-29 ENCOUNTER — Emergency Department (HOSPITAL_COMMUNITY): Payer: Medicaid Other

## 2018-04-29 ENCOUNTER — Other Ambulatory Visit: Payer: Medicaid Other

## 2018-04-29 DIAGNOSIS — O209 Hemorrhage in early pregnancy, unspecified: Secondary | ICD-10-CM | POA: Diagnosis not present

## 2018-04-29 DIAGNOSIS — O200 Threatened abortion: Secondary | ICD-10-CM

## 2018-04-29 DIAGNOSIS — Z3A00 Weeks of gestation of pregnancy not specified: Secondary | ICD-10-CM | POA: Diagnosis not present

## 2018-04-29 LAB — URINALYSIS, ROUTINE W REFLEX MICROSCOPIC
BACTERIA UA: NONE SEEN
BILIRUBIN URINE: NEGATIVE
Glucose, UA: NEGATIVE mg/dL
Ketones, ur: NEGATIVE mg/dL
LEUKOCYTES UA: NEGATIVE
NITRITE: NEGATIVE
PROTEIN: NEGATIVE mg/dL
Specific Gravity, Urine: 1.024 (ref 1.005–1.030)
pH: 6 (ref 5.0–8.0)

## 2018-04-29 LAB — CBC WITH DIFFERENTIAL/PLATELET
Abs Immature Granulocytes: 0.03 10*3/uL (ref 0.00–0.07)
Basophils Absolute: 0 10*3/uL (ref 0.0–0.1)
Basophils Relative: 0 %
EOS ABS: 0.1 10*3/uL (ref 0.0–0.5)
Eosinophils Relative: 1 %
HEMATOCRIT: 37.5 % (ref 36.0–46.0)
HEMOGLOBIN: 12 g/dL (ref 12.0–15.0)
IMMATURE GRANULOCYTES: 0 %
LYMPHS ABS: 2.5 10*3/uL (ref 0.7–4.0)
Lymphocytes Relative: 24 %
MCH: 26.9 pg (ref 26.0–34.0)
MCHC: 32 g/dL (ref 30.0–36.0)
MCV: 84.1 fL (ref 80.0–100.0)
Monocytes Absolute: 0.8 10*3/uL (ref 0.1–1.0)
Monocytes Relative: 7 %
NEUTROS PCT: 68 %
Neutro Abs: 7 10*3/uL (ref 1.7–7.7)
Platelets: 271 10*3/uL (ref 150–400)
RBC: 4.46 MIL/uL (ref 3.87–5.11)
RDW: 12.8 % (ref 11.5–15.5)
WBC: 10.4 10*3/uL (ref 4.0–10.5)
nRBC: 0 % (ref 0.0–0.2)

## 2018-04-29 LAB — BASIC METABOLIC PANEL
ANION GAP: 6 (ref 5–15)
BUN: 9 mg/dL (ref 6–20)
CALCIUM: 8.8 mg/dL — AB (ref 8.9–10.3)
CO2: 27 mmol/L (ref 22–32)
CREATININE: 0.55 mg/dL (ref 0.44–1.00)
Chloride: 106 mmol/L (ref 98–111)
GFR calc Af Amer: >60 mL/min (ref >60)
GFR calc non Af Amer: >60 mL/min (ref >60)
Glucose, Bld: 131 mg/dL — ABNORMAL HIGH (ref 70–99)
Potassium: 3.7 mmol/L (ref 3.5–5.1)
Sodium: 139 mmol/L (ref 135–145)

## 2018-04-29 LAB — WET PREP, GENITAL
Clue Cells Wet Prep HPF POC: NONE SEEN
SPERM: NONE SEEN
Trich, Wet Prep: NONE SEEN
YEAST WET PREP: NONE SEEN

## 2018-04-29 LAB — HCG, QUANTITATIVE, PREGNANCY: hCG, Beta Chain, Quant, S: 54 m[IU]/mL — ABNORMAL HIGH (ref <5)

## 2018-04-29 NOTE — ED Notes (Signed)
RN accompanied patient to ultrasound.

## 2018-04-29 NOTE — ED Provider Notes (Signed)
[facility] EMERGENCY DEPARTMENT Provider Note   CSN: [id number] Arrival date & time: 04/28/18  2305  Time seen 12:25 AM   History   Chief Complaint Chief Complaint  Patient presents with  . Vaginal Bleeding    HPI Kimberly May is a 27 y.o. female.  HPI patient is G2, P1 Ab0, last normal period started November 6.  She states she had her first OB appointment on December 17 and she is scheduled to get a ultrasound on January 31.  She states yesterday, December 18 she started having dysuria, decreased urinary output with each episode of urinating and urgency.  This continued today.  About 8 PM she went to the bathroom and she had blood when she wiped and there was blood in the toilet.  She states she started getting bilateral lateral lower abdominal discomfort about 9 PM and some low back achiness.  She denies any fever, nausea or vomiting.  She states she has had UTIs before.  She states she had her first appointment with her primary care doctor and today they redrew a CBC because her white blood cell count was either too low or too high.  PCP Wilson Singer, MD [facility]  Past Medical History:  Diagnosis Date  . Chlamydia 08/03/2016   Treated 3/26 pt and partner Augusto Gamble, 07/10/97.POC 4/23 at 4 pm, no sex and [facility] sent  . Tuberculosis 15 YRS AGO    FATHER HAD TB , PT WAS MED [place]    Patient Active Problem List   Diagnosis Date Noted  . Encounter to determine fetal viability of pregnancy 04/26/2018  . Less than [redacted] weeks gestation of pregnancy 04/26/2018  . Pregnancy test positive 04/26/2018  . Chlamydia 08/03/2016  . Encounter for IUD insertion 08/08/2013  . Contraception management 07/27/2013  . Hypoxemia 06/23/2013  . 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 Hypokalemia 06/18/2013  . Acute respiratory failure (HCC) 06/10/2013  . Septic shock (HCC) 06/10/2013  . ARF (acute renal failure) (HCC) 06/10/2013  . 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 Eustachian tube dysfunction 04/10/2011  . Tympanic membrane perforation 04/10/2011    Past Surgical History:  Procedure Laterality Date  . ADENOIDECTOMY    . CESAREAN SECTION  2009   . DIRECT LARYNGOSCOPY N/A 06/14/2013   Procedure: DIRECT LARYNGOSCOPY/RE-EXPLORATORY OF NECK;  Surgeon: Flo Shanks, MD;  Location: [facility] OR;  Service: ENT;  Laterality: N/A;  . INCISION AND DRAINAGE ABSCESS N/A 06/04/2013   Procedure: INCISION AND DRAINAGE PHARYNGEAL ABSCESS;  Surgeon: Melvenia Beam, MD;  Location: [facility] OR;  Service: ENT;  Laterality: N/A;  . INCISION AND DRAINAGE OF PERITONSILLAR ABCESS Left 06/09/2013   Procedure: INCISION AND DRAINAGE OF left retropharyneal abscess;  Surgeon: Flo Shanks, MD;  Location: [facility] OR;  Service: ENT;  Laterality: Left;  . TONSILLECTOMY  04/10/2011   Procedure: TONSILLECTOMY;  Surgeon: Antony Contras;  Location: [facility] OR;  Service: ENT;  Laterality: Bilateral;  . TONSILLECTOMY    . TRACHEOSTOMY TUBE PLACEMENT N/A 06/14/2013   Procedure: TRACHEOSTOMY;  Surgeon: Flo Shanks, MD;  Location: [facility] OR;  Service: ENT;  Laterality: N/A;  . TYMPANOSTOMY TUBE PLACEMENT  DONE TWICE    [facility]    ([facility])     OB History    Gravida  2   Para  1   Term  1   Preterm      AB      Living  1  SAB      TAB      Ectopic      Multiple      Live Births  1            Home Medications    Prior to Admission medications   Medication Sig Start Date End Date Taking? Authorizing Provider  cholecalciferol (VITAMIN D3) 25 MCG (1000 UT) tablet Take 1,000 Units by mouth daily.    [provider]  Prenatal MV-Min-FA-Omega-3 (PRENATAL GUMMIES/DHA & FA) 0.4-32.5 MG CHEW Chew 2 tablets by mouth daily. 12/01/17   Adline PotterGriffin, Jennifer A, NP    Family History Family History  Problem Relation Age of Onset  . Cancer Father        larynx  . Pneumonia Father   . Other Paternal Grandfather        colon tumor  . Thyroid disease  Paternal Grandmother   . Cancer Maternal Grandmother        hodgkins lymphoma, cervical  . Heart disease Maternal Grandmother   . Asthma Son   . ADD / ADHD Son     Social History Social History   Tobacco Use  . Smoking status: Never Smoker  . Smokeless tobacco: Never Used  Substance Use Topics  . Alcohol use: No  . Drug use: No     Allergies   Latex and Penicillins   Review of Systems Review of Systems  All other systems reviewed and are negative.    Physical Exam Updated Vital Signs BP 130/84   Pulse (!) 106   Resp 18   Ht 5' (1.524 m)   Wt 111.6 kg   LMP 03/16/2018 (Exact Date)   SpO2 100%   BMI 48.04 kg/m   Vital signs normal except for tachycardia   Physical Exam Vitals signs and nursing note reviewed.  Constitutional:      General: She is not in acute distress.    Appearance: Normal appearance. She is well-developed. She is not ill-appearing or toxic-appearing.  HENT:     Head: Normocephalic and atraumatic.     Right Ear: External ear normal.     Left Ear: External ear normal.     Nose: Nose normal. No mucosal edema or rhinorrhea.     Mouth/Throat:     Mouth: Mucous membranes are moist.     Dentition: No dental abscesses.     Pharynx: No uvula swelling.  Eyes:     Conjunctiva/sclera: Conjunctivae normal.     Pupils: Pupils are equal, round, and reactive to light.  Neck:     Musculoskeletal: Full passive range of motion without pain, normal range of motion and neck supple.  Cardiovascular:     Rate and Rhythm: Normal rate and regular rhythm.     Heart sounds: Normal heart sounds. No murmur. No friction rub. No gallop.   Pulmonary:     Effort: Pulmonary effort is normal. No respiratory distress.     Breath sounds: Normal breath sounds. No wheezing, rhonchi or rales.  Chest:     Chest wall: No tenderness or crepitus.  Abdominal:     General: Bowel sounds are normal. There is no distension.     Palpations: Abdomen is soft.     Tenderness:  There is no abdominal tenderness. There is no guarding or rebound.  Genitourinary:    Comments: Normal external genitalia, blood in the vault, there is no hemorrhaging present.  Cervix is tight and uterus is normal size.  There is no adnexal  tenderness. Musculoskeletal: Normal range of motion.        General: No tenderness.     Comments: Moves all extremities well.   Skin:    General: Skin is warm and dry.     Coloration: Skin is not pale.     Findings: No erythema or rash.  Neurological:     Mental Status: She is alert and oriented to person, place, and time.     Cranial Nerves: No cranial nerve deficit.  Psychiatric:        Mood and Affect: Mood is not anxious.        Speech: Speech normal.        Behavior: Behavior normal.      ED Treatments / Results  Labs (all labs ordered are listed, but only abnormal results are displayed) Results for orders placed or performed during the hospital encounter of 04/28/18  Wet prep, genital  Result Value Ref Range   Yeast Wet Prep HPF POC NONE SEEN NONE SEEN   Trich, Wet Prep NONE SEEN NONE SEEN   Clue Cells Wet Prep HPF POC NONE SEEN NONE SEEN   WBC, Wet Prep HPF POC FEW (A) NONE SEEN   Sperm NONE SEEN   Basic metabolic panel  Result Value Ref Range   Sodium 139 135 - 145 mmol/L   Potassium 3.7 3.5 - 5.1 mmol/L   Chloride 106 98 - 111 mmol/L   CO2 27 22 - 32 mmol/L   Glucose, Bld 131 (H) 70 - 99 mg/dL   BUN 9 6 - 20 mg/dL   Creatinine, Ser 5.780.55 0.44 - 1.00 mg/dL   Calcium 8.8 (L) 8.9 - 10.3 mg/dL   GFR calc non Af Amer >60 >60 mL/min   GFR calc Af Amer >60 >60 mL/min   Anion gap 6 5 - 15  CBC with Differential  Result Value Ref Range   WBC 10.4 4.0 - 10.5 K/uL   RBC 4.46 3.87 - 5.11 MIL/uL   Hemoglobin 12.0 12.0 - 15.0 g/dL   HCT 46.937.5 62.936.0 - 52.846.0 %   MCV 84.1 80.0 - 100.0 fL   MCH 26.9 26.0 - 34.0 pg   MCHC 32.0 30.0 - 36.0 g/dL   RDW 41.312.8 24.411.5 - 01.015.5 %   Platelets 271 150 - 400 K/uL   nRBC 0.0 0.0 - 0.2 %   Neutrophils  Relative % 68 %   Neutro Abs 7.0 1.7 - 7.7 K/uL   Lymphocytes Relative 24 %   Lymphs Abs 2.5 0.7 - 4.0 K/uL   Monocytes Relative 7 %   Monocytes Absolute 0.8 0.1 - 1.0 K/uL   Eosinophils Relative 1 %   Eosinophils Absolute 0.1 0.0 - 0.5 K/uL   Basophils Relative 0 %   Basophils Absolute 0.0 0.0 - 0.1 K/uL   Immature Granulocytes 0 %   Abs Immature Granulocytes 0.03 0.00 - 0.07 K/uL  hCG, quantitative, pregnancy  Result Value Ref Range   hCG, Beta Chain, Quant, S 54 (H) <5 mIU/mL  Urinalysis, Routine w reflex microscopic  Result Value Ref Range   Color, Urine YELLOW YELLOW   APPearance CLEAR CLEAR   Specific Gravity, Urine 1.024 1.005 - 1.030   pH 6.0 5.0 - 8.0   Glucose, UA NEGATIVE NEGATIVE mg/dL   Hgb urine dipstick MODERATE (A) NEGATIVE   Bilirubin Urine NEGATIVE NEGATIVE   Ketones, ur NEGATIVE NEGATIVE mg/dL   Protein, ur NEGATIVE NEGATIVE mg/dL   Nitrite NEGATIVE NEGATIVE   Leukocytes, UA NEGATIVE  NEGATIVE   RBC / HPF 0-5 0 - 5 RBC/hpf   WBC, UA 0-5 0 - 5 WBC/hpf   Bacteria, UA NONE SEEN NONE SEEN   Squamous Epithelial / LPF 0-5 0 - 5   Mucus PRESENT    Laboratory interpretation all normal except much lower beta hCG than expected.    EKG None  Radiology US Ob Comp Less 14 Wks  Result Date: 04/29/2018 CLINICAL DATA:  Vaginal bleeding for 1 night. Quantitative beta HCG is 54. Estimated gestational age by LMP is 6 weeks 2 days EXAM: OBSTETRIC <14 WK [place] AND TRANSVAGINAL OB US TECHNIQUE: Both transabdominal and transvaginal ultrasound examinations were performed for complete evaluation of the gestation as well as the maternal uterus, adnexal regions, and pelvic cul-de-sac. Transvaginal technique was performed to assess early pregnancy. COMPARISON:  CT abdomen and pelvis 08/14/2016 FINDINGS: Intrauterine gestational sac: None Yolk sac:  Not Visualized. Embryo:  Not Visualized. Cardiac Activity: Not Visualized. Maternal uterus/adnexae: Uterus is anteverted. No myometrial  masses. Small nabothian cysts in the cervix. Endometrium is homogeneous. No endometrial fluid. Both ovaries are visualized. Cyst on the left ovary measuring up to about 2.6 cm diameter consistent with functional cyst. Minimal free fluid in the pelvis. IMPRESSION: No intrauterine gestational sac, yolk sac, or fetal pole identified. Differential considerations include intrauterine pregnancy too early to be sonographically visualized, missed abortion, or ectopic pregnancy. Followup ultrasound is recommended in 10-14 days for further evaluation. Electronically Signed   By: Burman Nieves M.D.   On: 04/29/2018 03:10    Procedures Procedures (including critical care time)  Medications Ordered in ED Medications - No data to display   Initial Impression / Assessment and Plan / ED Course  I have reviewed the triage vital signs and the nursing notes.  Pertinent labs & imaging results that were available during my care of the patient were reviewed by me and considered in my medical decision making (see chart for details).     Review of chart, patient is A+  I discussed with the patient without low beta-hCG either she got pregnant later than she thought or she could be having a nonviable pregnancy with impending miscarriage.  According to her last week.  She should be 6 weeks +2 days pregnant.  She should have her beta-hCG drawn again, she can contact [facility] to see if she wants it done over the weekend or she should go the office on Monday and have it done.  There is also some concern for ectopic pregnancy.  Pelvic ultrasound was ordered.  Ultrasound result reviewed and was as expected.  But there was no obvious signs of a ectopic such as mass on the ovary or free fluid.  She has a repeat ultrasound scheduled at her OB office on January 31.  She was discharged home with pelvic rest, Tylenol if needed for pain.  She should follow-up this morning with [facility] to arrange to get a repeat beta-hCG  done.  Final Clinical Impressions(s) / ED Diagnoses   Final diagnoses:  Vaginal bleeding  Pain, pelvic, female  Threatened miscarriage    ED Discharge Orders    None    OTC acemtaminophen  Plan discharge  Devoria Albe, MD, Concha Pyo, MD 04/29/18 [phone]

## 2018-04-29 NOTE — Discharge Instructions (Addendum)
No sex, tampons, or douching until you are advised differently by [facility].  You can take Tylenol if needed for pain.  Please call [facility] this morning to see when they want you to have the repeat beta-hCG done, your pregnancy hormone level.  Your level today was only 54.  You may have a very early pregnancy or you could be losing a pregnancy.  Right now it is too early to tell.  If you start get heavy bleeding more than a period or severe pain or get lightheaded or dizzy you should be rechecked.  Continue your prenatal vitamins.

## 2018-04-29 NOTE — ED Notes (Signed)
Pelvic cart/supplies located in patient's room.

## 2018-04-30 LAB — BETA HCG QUANT (REF LAB): hCG Quant: 39 m[IU]/mL

## 2018-05-02 ENCOUNTER — Telehealth: Payer: Self-pay | Admitting: Adult Health

## 2018-05-02 ENCOUNTER — Other Ambulatory Visit: Payer: Medicaid Other

## 2018-05-02 DIAGNOSIS — O200 Threatened abortion: Secondary | ICD-10-CM

## 2018-05-02 LAB — GC/CHLAMYDIA PROBE AMP (~~LOC~~) NOT AT ARMC
Chlamydia: NEGATIVE
NEISSERIA GONORRHEA: NEGATIVE

## 2018-05-02 NOTE — Telephone Encounter (Signed)
Patient called, stated she went to the ER on 04/28/18 for a possible miscarriage.  [facility] and she would like to know the results or if she needed to come in.  [phone] - may have to leave a message

## 2018-05-03 LAB — BETA HCG QUANT (REF LAB): hCG Quant: 1 m[IU]/mL

## 2018-05-10 ENCOUNTER — Other Ambulatory Visit: Payer: Medicaid Other

## 2018-05-24 ENCOUNTER — Ambulatory Visit: Payer: Medicaid Other | Admitting: *Deleted

## 2018-05-24 ENCOUNTER — Encounter: Payer: Medicaid Other | Admitting: Women's Health

## 2018-06-15 DIAGNOSIS — J111 Influenza due to unidentified influenza virus with other respiratory manifestations: Secondary | ICD-10-CM | POA: Diagnosis not present

## 2018-08-01 ENCOUNTER — Encounter (INDEPENDENT_AMBULATORY_CARE_PROVIDER_SITE_OTHER): Payer: Self-pay | Admitting: Internal Medicine

## 2018-08-02 ENCOUNTER — Emergency Department (HOSPITAL_COMMUNITY): Payer: Medicaid Other

## 2018-08-02 ENCOUNTER — Encounter (HOSPITAL_COMMUNITY): Payer: Self-pay | Admitting: Emergency Medicine

## 2018-08-02 ENCOUNTER — Other Ambulatory Visit: Payer: Self-pay

## 2018-08-02 ENCOUNTER — Emergency Department (HOSPITAL_COMMUNITY)
Admission: EM | Admit: 2018-08-02 | Discharge: 2018-08-02 | Disposition: A | Discharge status: Home / Self Care | Payer: Medicaid Other | Attending: Emergency Medicine | Admitting: Emergency Medicine

## 2018-08-02 DIAGNOSIS — Z79899 Other long term (current) drug therapy: Secondary | ICD-10-CM | POA: Diagnosis not present

## 2018-08-02 DIAGNOSIS — N23 Unspecified renal colic: Secondary | ICD-10-CM | POA: Diagnosis not present

## 2018-08-02 DIAGNOSIS — N200 Calculus of kidney: Secondary | ICD-10-CM | POA: Insufficient documentation

## 2018-08-02 DIAGNOSIS — N390 Urinary tract infection, site not specified: Secondary | ICD-10-CM | POA: Diagnosis present

## 2018-08-02 DIAGNOSIS — N130 Hydronephrosis with ureteropelvic junction obstruction: Secondary | ICD-10-CM | POA: Diagnosis not present

## 2018-08-02 LAB — URINALYSIS, ROUTINE W REFLEX MICROSCOPIC
Bilirubin Urine: NEGATIVE
Glucose, UA: NEGATIVE mg/dL
Ketones, ur: NEGATIVE mg/dL
Nitrite: NEGATIVE
PROTEIN: 100 mg/dL — AB
Specific Gravity, Urine: 1.031 — ABNORMAL HIGH (ref 1.005–1.030)
pH: 5 (ref 5.0–8.0)

## 2018-08-02 LAB — PREGNANCY, URINE: Preg Test, Ur: NEGATIVE

## 2018-08-02 MED ORDER — FENTANYL CITRATE (PF) 100 MCG/2ML IJ SOLN
100.0000 ug | Freq: Once | INTRAMUSCULAR | Status: AC
  Administered 2018-08-02: 100 ug via INTRAVENOUS
  Filled 2018-08-02: qty 2

## 2018-08-02 MED ORDER — MELOXICAM 15 MG PO TABS: 15.0000 mg | ORAL_TABLET | Freq: Every day | ORAL | 0 refills | Status: DC

## 2018-08-02 MED ORDER — ONDANSETRON HCL 4 MG/2ML IJ SOLN
4.0000 mg | Freq: Once | INTRAMUSCULAR | Status: AC
  Administered 2018-08-02: 4 mg via INTRAVENOUS
  Filled 2018-08-02: qty 2

## 2018-08-02 MED ORDER — KETOROLAC TROMETHAMINE 30 MG/ML IJ SOLN
30.0000 mg | Freq: Once | INTRAMUSCULAR | Status: AC
  Administered 2018-08-02: 30 mg via INTRAVENOUS
  Filled 2018-08-02: qty 1

## 2018-08-02 MED ORDER — PROCHLORPERAZINE EDISYLATE 10 MG/2ML IJ SOLN
5.0000 mg | Freq: Once | INTRAMUSCULAR | Status: AC
  Administered 2018-08-02: 5 mg via INTRAVENOUS
  Filled 2018-08-02: qty 2

## 2018-08-02 MED ORDER — HYDROMORPHONE HCL 1 MG/ML IJ SOLN
0.5000 mg | Freq: Once | INTRAMUSCULAR | Status: AC
  Administered 2018-08-02: 0.5 mg via INTRAVENOUS
  Filled 2018-08-02: qty 1

## 2018-08-02 MED ORDER — OXYCODONE-ACETAMINOPHEN 5-325 MG PO TABS: 1.0000 | ORAL_TABLET | Freq: Four times a day (QID) | ORAL | 0 refills | Status: DC | PRN

## 2018-08-02 NOTE — ED Notes (Signed)
Pt had large amount of emesis on the floor when RN went into room to start IV. Pt reports she was in so much pain she had to vomit suddenly.

## 2018-08-02 NOTE — Discharge Instructions (Addendum)
Your CT scan suggest a kidney stone on the right side.  It is near your bladder, and should not cause a major problem.  Please strain your urine.  Please save the stone should you pass it.  Use meloxicam daily with food.  Use Percocet for more severe pain. This medication may cause drowsiness. Please do not drink, drive, or participate in activity that requires concentration while taking this medication.  Please see your primary physician or return to the emergency department if any changes in your condition, worsening of your pain, problems, or concerns.

## 2018-08-02 NOTE — ED Triage Notes (Signed)
Pt c/o of right sided flank pain with burning with urination and unable to fully empty bladder.  Going on since this morning

## 2018-08-02 NOTE — ED Provider Notes (Signed)
[facility] EMERGENCY DEPARTMENT Provider Note   CSN: [id number] Arrival date & time: 08/02/18  1527    History   Chief Complaint Chief Complaint  Patient presents with  . Urinary Tract Infection    HPI Kimberly May is a 28 y.o. female.     The history is provided by the patient.  Urinary Tract Infection  Pain quality:  Burning Pain severity:  Moderate Onset quality:  Sudden Timing:  Intermittent Progression:  Worsening Chronicity:  New Recent urinary tract infections: no   Relieved by:  Nothing Worsened by:  Nothing Ineffective treatments:  None tried Urinary symptoms: frequent urination   Urinary symptoms comment:  Right flank pain Associated symptoms: flank pain   Associated symptoms: no abdominal pain, no fever and no vaginal discharge     Past Medical History:  Diagnosis Date  . Chlamydia 08/03/2016   Treated 3/26 pt and partner Augusto Gamble, 07/10/97.POC 4/23 at 4 pm, no sex and [facility] sent  . Tuberculosis 15 YRS AGO    FATHER HAD TB , PT WAS MED [place]    Patient Active Problem List   Diagnosis Date Noted  . Encounter to determine fetal viability of pregnancy 04/26/2018  . Less than [redacted] weeks gestation of pregnancy 04/26/2018  . Pregnancy test positive 04/26/2018  . Chlamydia 08/03/2016  . Encounter for IUD insertion 08/08/2013  . Contraception management 07/27/2013  . Hypoxemia 06/23/2013  . 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 Hypokalemia 06/18/2013  . Acute respiratory failure (HCC) 06/10/2013  . Septic shock (HCC) 06/10/2013  . ARF (acute renal failure) (HCC) 06/10/2013  . 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 Palatine tonsil hypertrophy 04/10/2011  . Eustachian tube dysfunction 04/10/2011  . Tympanic membrane perforation 04/10/2011    Past Surgical History:  Procedure Laterality Date  . ADENOIDECTOMY    . CESAREAN SECTION  2009   . DIRECT LARYNGOSCOPY N/A 06/14/2013   Procedure: DIRECT LARYNGOSCOPY/RE-EXPLORATORY OF NECK;  Surgeon: Flo Shanks, MD;  Location: [facility] OR;  Service: ENT;  Laterality: N/A;  . INCISION AND DRAINAGE ABSCESS N/A 06/04/2013   Procedure: INCISION AND DRAINAGE PHARYNGEAL ABSCESS;  Surgeon: Melvenia Beam, MD;  Location: [facility] OR;  Service: ENT;  Laterality: N/A;  . INCISION AND DRAINAGE OF PERITONSILLAR ABCESS Left 06/09/2013   Procedure: INCISION AND DRAINAGE OF left retropharyneal abscess;  Surgeon: Flo Shanks, MD;  Location: [facility] OR;  Service: ENT;  Laterality: Left;  . TONSILLECTOMY  04/10/2011   Procedure: TONSILLECTOMY;  Surgeon: Antony Contras;  Location: [facility] OR;  Service: ENT;  Laterality: Bilateral;  . TONSILLECTOMY    . TRACHEOSTOMY TUBE PLACEMENT N/A 06/14/2013   Procedure: TRACHEOSTOMY;  Surgeon: Flo Shanks, MD;  Location: [facility] OR;  Service: ENT;  Laterality: N/A;  . TYMPANOSTOMY TUBE PLACEMENT  DONE TWICE    BILAT    ([facility])     OB History    Gravida  2   Para  1   Term  1   Preterm      AB      Living  1     SAB      TAB      Ectopic      Multiple      Live Births  1            Home Medications    Prior to Admission medications   Medication Sig Start Date End Date Taking? Authorizing Provider  cholecalciferol (VITAMIN D3) 25 MCG (1000 UT) tablet Take 1,000 Units by  mouth daily.    [provider]  naproxen sodium (ALEVE) 220 MG tablet Take 220 mg by mouth daily as needed.    [provider]  Prenatal MV-Min-FA-Omega-3 (PRENATAL GUMMIES/DHA & FA) 0.4-32.5 MG CHEW Chew 2 tablets by mouth daily. 12/01/17   Adline Potter, NP    Family History Family History  Problem Relation Age of Onset  . Cancer Father        larynx  . Pneumonia Father   . Other Paternal Grandfather        colon tumor  . Thyroid disease Paternal Grandmother   . Cancer Maternal Grandmother        hodgkins lymphoma, cervical  . Heart disease Maternal Grandmother   . Asthma Son   . ADD / ADHD Son     Social History Social History   Tobacco Use  . Smoking  status: Never Smoker  . Smokeless tobacco: Never Used  Substance Use Topics  . Alcohol use: No  . Drug use: No     Allergies   Latex and Penicillins   Review of Systems Review of Systems  Constitutional: Negative for activity change and fever.       All ROS Neg except as noted in HPI  HENT: Negative for nosebleeds.   Eyes: Negative for photophobia and discharge.  Respiratory: Negative for cough, shortness of breath and wheezing.   Cardiovascular: Negative for chest pain and palpitations.  Gastrointestinal: Negative for abdominal pain and blood in stool.  Genitourinary: Positive for dysuria, flank pain and frequency. Negative for hematuria, vaginal bleeding and vaginal discharge.  Musculoskeletal: Negative for arthralgias, back pain and neck pain.  Skin: Negative.   Neurological: Negative for dizziness, seizures and speech difficulty.  Psychiatric/Behavioral: Negative for confusion and hallucinations.     Physical Exam Updated Vital Signs BP (!) 138/91   Pulse 80   Temp 97.6 F (36.4 C) (Oral)   Resp 18   Ht 5' (1.524 m)   Wt 106.6 kg   LMP 03/16/2018 (Exact Date)   SpO2 100%   BMI 45.90 kg/m   Physical Exam Vitals signs and nursing note reviewed.  Constitutional:      Appearance: She is well-developed. She is ill-appearing. She is not toxic-appearing.  HENT:     Head: Normocephalic.     Right Ear: Tympanic membrane and external ear normal.     Left Ear: Tympanic membrane and external ear normal.  Eyes:     General: Lids are normal.     Pupils: Pupils are equal, round, and reactive to light.  Neck:     Musculoskeletal: Normal range of motion and neck supple.     Vascular: No carotid bruit.  Cardiovascular:     Rate and Rhythm: Normal rate and regular rhythm.     Pulses: Normal pulses.     Heart sounds: Normal heart sounds.  Pulmonary:     Effort: No respiratory distress.     Breath sounds: Normal breath sounds.  Abdominal:     General: Bowel sounds are  normal.     Palpations: Abdomen is soft.     Tenderness: There is no abdominal tenderness. There is right CVA tenderness. There is no guarding.     Comments: Patient has right flank pain even to light touch.  No rash appreciated.  No reddened areas.  No bruising appreciated.  Abdomen is soft with good bowel sounds.  Mild discomfort in the right mid and lower abdomen.  Patient uncooperative for complete  exam.  Musculoskeletal: Normal range of motion.       Back:  Lymphadenopathy:     Head:     Right side of head: No submandibular adenopathy.     Left side of head: No submandibular adenopathy.     Cervical: No cervical adenopathy.  Skin:    General: Skin is warm and dry.  Neurological:     Mental Status: She is alert and oriented to person, place, and time.     Cranial Nerves: No cranial nerve deficit.     Sensory: No sensory deficit.  Psychiatric:        Speech: Speech normal.      ED Treatments / Results  Labs (all labs ordered are listed, but only abnormal results are displayed) Labs Reviewed  URINALYSIS, ROUTINE W REFLEX MICROSCOPIC    EKG None  Radiology No results found.  Procedures Procedures (including critical care time)  Medications Ordered in ED Medications - No data to display   Initial Impression / Assessment and Plan / ED Course  I have reviewed the triage vital signs and the nursing notes.  Pertinent labs & imaging results that were available during my care of the patient were reviewed by me and considered in my medical decision making (see chart for details).          Final Clinical Impressions(s) / ED Diagnoses MDM  Vital signs reviewed.  Pulse oximetry is 100% on room air.  Within normal limits by my interpretation.  Patient is tearful and at times pacing in the room holding the right flank.  Will check urine for urinary tract infection or signs of kidney stone.  We will also obtain a pregnancy test to evaluate for possible ectopic.  Minimal right lower quadrant or periumbilical pain.  The pain with walking is in the flank area and not in the lower abdomen or the periumbilical area, doubt appendicitis.  Patient states she got very little relief from 0.5 mg of Dilaudid.  A second dose has been given.  Urine analysis shows a turbid yellow specimen with a specific gravity of 1.031.  There is moderate hemoglobin.  There is 100 mg/daL of protein.  Moderate leukocyte esterase.  6-10 WBCs present.  There is amorphous crystals present.  Will obtain a CT scan stone study.  The urine pregnancy test is negative.  Pt states the Dilaudid medication is not helping very much. She request additional pain medications. Fentanyl and compazine ordered.  Recheck.  Patient got relief from this medication.  She is resting comfortably.  Will monitor pulse oximetry and blood pressure as patient has had a significant amount of medication.  Patient ambulatory to the bathroom without problem.  I have asked the patient to strain all urine.  Prescription for meloxicam and Percocet given to the patient.  The patient will return to the emergency department or see the primary physician if any intractable pain, changes in condition, problems, or concerns.  Patient is in agreement with this plan.      Final diagnoses:  Ureteral colic  Kidney stone    ED Discharge Orders         Ordered    meloxicam (MOBIC) 15 MG tablet  Daily     08/02/18 2029    oxyCODONE-acetaminophen (PERCOCET/ROXICET) 5-325 MG tablet  Every 6 hours PRN     08/02/18 2029           Ivery Quale, PA-C 08/02/18 2048    Mancel Bale, MD 08/04/18 [phone]

## 2018-08-04 LAB — URINE CULTURE: Culture: NO GROWTH

## 2018-09-28 ENCOUNTER — Ambulatory Visit: Payer: Medicaid Other | Admitting: Adult Health

## 2018-09-29 ENCOUNTER — Other Ambulatory Visit: Payer: Self-pay

## 2018-09-29 ENCOUNTER — Ambulatory Visit (INDEPENDENT_AMBULATORY_CARE_PROVIDER_SITE_OTHER): Payer: Medicaid Other | Admitting: Adult Health

## 2018-09-29 ENCOUNTER — Encounter: Payer: Self-pay | Admitting: Adult Health

## 2018-09-29 VITALS — Ht 60.0 in | Wt 246.6 lb

## 2018-09-29 DIAGNOSIS — O3680X0 Pregnancy with inconclusive fetal viability, not applicable or unspecified: Secondary | ICD-10-CM | POA: Diagnosis not present

## 2018-09-29 DIAGNOSIS — Z98891 History of uterine scar from previous surgery: Secondary | ICD-10-CM

## 2018-09-29 DIAGNOSIS — Z3A01 Less than 8 weeks gestation of pregnancy: Secondary | ICD-10-CM

## 2018-09-29 DIAGNOSIS — Z3201 Encounter for pregnancy test, result positive: Secondary | ICD-10-CM | POA: Insufficient documentation

## 2018-09-29 HISTORY — DX: History of uterine scar from previous surgery: Z98.891

## 2018-09-29 NOTE — Progress Notes (Signed)
Patient ID: Kimberly May, female   DOB: [birth year], 28 y.o.   MRN: [id number]   TELEHEALTH VIRTUAL GYNECOLOGY VISIT ENCOUNTER NOTE  I connected with Kimberly Gilkey on 09/29/18 at 10:15 AM EDT by telephone at home and verified that I am speaking with the correct person using two identifiers.   I discussed the limitations, risks, security and privacy concerns of performing an evaluation and management service by telephone and the availability of in person appointments. I also discussed with the patient that there may be a patient responsible charge related to this service. The patient expressed understanding and agreed to proceed.   History:  Kimberly Dembeck is a 28 y.o. G3P1011,white female,single being evaluated today for missed period and 2+HPTs, LMP was 08/23/18, so about 5+2 weeks with EDD 05/30/19.She is working at [employer]. She denies any abnormal vaginal discharge, bleeding, pelvic pain or other concerns.    PCP is Dr Karilyn CotaGosrani.    Past Medical History:  Diagnosis Date  . Chlamydia 08/03/2016   Treated 3/26 pt and partner Augusto GambleDevin Daniel, 07/10/97.POC 4/23 at 4 pm, no sex and NCCDRC sent  . Tuberculosis 15 YRS AGO    FATHER HAD TB , PT WAS MED [place]   Past Surgical History:  Procedure Laterality Date  . ADENOIDECTOMY    . CESAREAN SECTION  2009   . DIRECT LARYNGOSCOPY N/A 06/14/2013   Procedure: DIRECT LARYNGOSCOPY/RE-EXPLORATORY OF NECK;  Surgeon: Flo ShanksKarol Wolicki, MD;  [facility];  Service: ENT;  Laterality: N/A;  . INCISION AND DRAINAGE ABSCESS N/A 06/04/2013   Procedure: INCISION AND DRAINAGE PHARYNGEAL ABSCESS;  Surgeon: Melvenia BeamMitchell Gore, MD;  [facility];  Service: ENT;  Laterality: N/A;  . INCISION AND DRAINAGE OF PERITONSILLAR ABCESS Left 06/09/2013   Procedure: INCISION AND DRAINAGE OF left retropharyneal abscess;  Surgeon: Flo ShanksKarol Wolicki, MD;  [facility];  Service: ENT;  Laterality: Left;  . TONSILLECTOMY  04/10/2011   Procedure: TONSILLECTOMY;  Surgeon: Antony Contraswight D Bates;  Location: [facility]  OR;  Service: ENT;  Laterality: Bilateral;  . TONSILLECTOMY    . TRACHEOSTOMY TUBE PLACEMENT N/A 06/14/2013   Procedure: TRACHEOSTOMY;  Surgeon: Flo ShanksKarol Wolicki, MD;  [facility];  Service: ENT;  Laterality: N/A;  . TYMPANOSTOMY TUBE PLACEMENT  DONE TWICE    BILAT    [facility]   The following portions of the patient's history were reviewed and updated as appropriate: allergies, current medications, past family history, past medical history, past social history, past surgical history and problem list.   Health Maintenance:  Normal pap on 07/30/16.  Review of Systems:  Pertinent items noted in HPI and remainder of comprehensive ROS otherwise negative.  Physical Exam:   General:  Alert, oriented and cooperative.   Mental Status: Normal mood and affect perceived. Normal judgment and thought content.  Physical exam deferred due to nature of the encounter Ht 5' (1.524 m)   Wt 246 lb 9.6 oz (111.9 kg)   LMP 08/23/2018   BMI 48.16 kg/m per pt. Fall risk is low.   Labs and Imaging No results found for this or any previous visit (from the past 336 hour(s)). No results found.    Assessment and Plan:     1. Positive pregnancy test 2+HPTs   2. Less than [redacted] weeks gestation of pregnancy -continue OTC PNV  3. Encounter to determine fetal viability of pregnancy, single or unspecified fetus  - US OB Comp Less 14 Wks; Future, ina bout 3 weeks  I discussed the assessment and treatment plan with the patient. The patient was provided an opportunity to ask questions and all were answered. The patient agreed with the plan and demonstrated an understanding of the instructions.   The patient was advised to call back or seek an in-person evaluation/go to the ED if the symptoms worsen or if the condition fails to improve as anticipated.  I provided 6 minutes of non-face-to-face time during this encounter.   Cyril Mourning, NP [facility] [employer], [facility]

## 2018-10-21 ENCOUNTER — Ambulatory Visit (INDEPENDENT_AMBULATORY_CARE_PROVIDER_SITE_OTHER): Payer: Medicaid Other

## 2018-10-21 ENCOUNTER — Other Ambulatory Visit: Payer: Self-pay

## 2018-10-21 DIAGNOSIS — O3680X0 Pregnancy with inconclusive fetal viability, not applicable or unspecified: Secondary | ICD-10-CM

## 2018-10-21 DIAGNOSIS — Z3A08 8 weeks gestation of pregnancy: Secondary | ICD-10-CM

## 2018-10-21 NOTE — Progress Notes (Signed)
[place] 8+3 wks,single IUP w/ys,positive fht 169 bpm,normal right ovary,simple left corpus luteal cyst 2.6 x 1.8 x 2.2 cm,crl 19.81 mm

## 2018-11-07 ENCOUNTER — Other Ambulatory Visit (HOSPITAL_COMMUNITY): Payer: Self-pay | Admitting: Nurse Practitioner

## 2018-11-07 ENCOUNTER — Other Ambulatory Visit (HOSPITAL_COMMUNITY): Payer: Self-pay | Admitting: Internal Medicine

## 2018-11-07 DIAGNOSIS — IMO0002 Reserved for concepts with insufficient information to code with codable children: Secondary | ICD-10-CM

## 2018-11-22 ENCOUNTER — Ambulatory Visit (HOSPITAL_COMMUNITY)
Admission: RE | Admit: 2018-11-22 | Discharge: 2018-11-22 | Disposition: A | Discharge status: Home / Self Care | Payer: Medicaid Other | Source: Ambulatory Visit | Attending: Nurse Practitioner | Admitting: Nurse Practitioner

## 2018-11-22 ENCOUNTER — Other Ambulatory Visit: Payer: Self-pay

## 2018-11-22 DIAGNOSIS — IMO0002 Reserved for concepts with insufficient information to code with codable children: Secondary | ICD-10-CM

## 2018-11-22 DIAGNOSIS — N6324 Unspecified lump in the left breast, lower inner quadrant: Secondary | ICD-10-CM | POA: Diagnosis not present

## 2018-11-22 DIAGNOSIS — R229 Localized swelling, mass and lump, unspecified: Secondary | ICD-10-CM | POA: Diagnosis not present

## 2018-11-23 ENCOUNTER — Other Ambulatory Visit: Payer: Self-pay | Admitting: Obstetrics and Gynecology

## 2018-11-23 DIAGNOSIS — Z3682 Encounter for antenatal screening for nuchal translucency: Secondary | ICD-10-CM

## 2018-11-24 ENCOUNTER — Ambulatory Visit (INDEPENDENT_AMBULATORY_CARE_PROVIDER_SITE_OTHER): Payer: Medicaid Other | Admitting: Advanced Practice Midwife

## 2018-11-24 ENCOUNTER — Ambulatory Visit (INDEPENDENT_AMBULATORY_CARE_PROVIDER_SITE_OTHER): Payer: Medicaid Other

## 2018-11-24 ENCOUNTER — Other Ambulatory Visit: Payer: Self-pay

## 2018-11-24 ENCOUNTER — Encounter: Payer: Self-pay | Admitting: Advanced Practice Midwife

## 2018-11-24 ENCOUNTER — Ambulatory Visit: Payer: Medicaid Other | Admitting: *Deleted

## 2018-11-24 DIAGNOSIS — Z1379 Encounter for other screening for genetic and chromosomal anomalies: Secondary | ICD-10-CM

## 2018-11-24 DIAGNOSIS — O0990 Supervision of high risk pregnancy, unspecified, unspecified trimester: Secondary | ICD-10-CM | POA: Insufficient documentation

## 2018-11-24 DIAGNOSIS — Z3682 Encounter for antenatal screening for nuchal translucency: Secondary | ICD-10-CM | POA: Diagnosis not present

## 2018-11-24 DIAGNOSIS — Z331 Pregnant state, incidental: Secondary | ICD-10-CM

## 2018-11-24 DIAGNOSIS — Z3A13 13 weeks gestation of pregnancy: Secondary | ICD-10-CM

## 2018-11-24 DIAGNOSIS — Z3481 Encounter for supervision of other normal pregnancy, first trimester: Secondary | ICD-10-CM | POA: Diagnosis not present

## 2018-11-24 DIAGNOSIS — Z1389 Encounter for screening for other disorder: Secondary | ICD-10-CM

## 2018-11-24 DIAGNOSIS — Z363 Encounter for antenatal screening for malformations: Secondary | ICD-10-CM

## 2018-11-24 LAB — POCT URINALYSIS DIPSTICK OB
Blood, UA: NEGATIVE
Glucose, UA: NEGATIVE
Ketones, UA: NEGATIVE
Leukocytes, UA: NEGATIVE
Nitrite, UA: NEGATIVE
POC,PROTEIN,UA: NEGATIVE

## 2018-11-24 NOTE — Progress Notes (Signed)
INITIAL OBSTETRICAL VISIT Patient name: Kimberly May MRN [id number]  Date of birth: 06/03/1990 Chief Complaint:   Initial Prenatal Visit (nt/it)  History of Present Illness:   Kimberly May is a 28 y.o. G82P1011 Caucasian female at [redacted]w[redacted]d by LMP and 8 week [place] with an Estimated Date of Delivery: 05/30/19 being seen today for her initial obstetrical visit.   Her obstetrical history is significant for CS for "stalling at 5cms then the heart rate drropped)".  Wants repeat probably.   Today she reports no complaints.  Patient's last menstrual period was 08/23/2018. Last pap 2018. Results were: normal Review of Systems:   Pertinent items are noted in HPI Denies cramping/contractions, leakage of fluid, vaginal bleeding, abnormal vaginal discharge w/ itching/odor/irritation, headaches, visual changes, shortness of breath, chest pain, abdominal pain, severe nausea/vomiting, or problems with urination or bowel movements unless otherwise stated above.  Pertinent History Reviewed:  Reviewed past medical,surgical, social, obstetrical and family history.  Reviewed problem list, medications and allergies. OB History  Gravida Para Term Preterm AB Living  3 1 1   1 1   SAB TAB Ectopic Multiple Live Births  1       1    # Outcome Date GA Lbr Len/2nd Weight Sex Delivery Anes PTL Lv  3 Current           2 SAB 04/2018          1 Term 08/04/07 [redacted]w[redacted]d  6 lb 4 oz (2.835 kg) M CS-LTranv EPI  LIV     Complications: Fetal Intolerance   Physical Assessment:  There were no vitals filed for this visit.There is no height or weight on file to calculate BMI.       Physical Examination:  General appearance - well appearing, and in no distress  Mental status - alert, oriented to person, place, and time  Psych:  She has a normal mood and affect  Skin - warm and dry, normal color, no suspicious lesions noted  Chest - effort normal, all lung fields clear to auscultation bilaterally  Heart - normal rate and regular  rhythm  Abdomen - soft, nontender  Extremities:  No swelling or varicosities noted cotesting  [place] 13+2 wks,measurements c/w dates,crl 64.68 mm,normal right ovary, simple left corpus luteal cyst 3.1 cm,fhr 140 bpm,NB present,NT 1.9 mm,anterior placenta gr 0  Results for orders placed or performed in visit on 11/24/18 (from the past 24 hour(s))  POC Urinalysis Dipstick OB   Collection Time: 11/24/18  2:53 PM  Result Value Ref Range   Color, UA     Clarity, UA     Glucose, UA Negative Negative   Bilirubin, UA     Ketones, UA neg    Spec Grav, UA     Blood, UA neg    pH, UA     POC,PROTEIN,UA Negative Negative, Trace, Small (1+), Moderate (2+), Large (3+), 4+   Urobilinogen, UA     Nitrite, UA neg    Leukocytes, UA Negative Negative   Appearance     Odor      Assessment & Plan:  1) Low-Risk Pregnancy G3P1011 at [redacted]w[redacted]d with an Estimated Date of Delivery: 05/30/19   2) Initial OB visit  3) previous CS, wants repeat  Meds: No orders of the defined types were placed in this encounter.   Initial labs obtained Continue prenatal vitamins Reviewed n/v relief measures and warning s/s to report Reviewed recommended weight gain based on pre-gravid BMI Encouraged well-balanced diet Watched video for carrier  screening/genetic testing:  Genetic Screening discussed Integrated Screen: requested Cystic fibrosis screening declined SMA screening declined Fragile X screening declined Ultrasound discussed; fetal survey: declined CCNC completed  Follow-up: Return in about 5 weeks (around 12/29/2018) for LROB, ZO:XWRUEAVS:Anatomy.   Orders Placed This Encounter  Procedures  . GC/Chlamydia Probe Amp  . Urine Culture  . US OB Comp + 14 Wk  . Obstetric Panel, Including HIV  . Sickle cell screen  . Urinalysis, Routine w reflex microscopic  . Integrated 1  . Pain Management Screening Profile (10S)  . POC Urinalysis Dipstick OB    Jacklyn ShellFrances Cresenzo-Dishmon DNP, CNM 11/24/2018 3:00 PM

## 2018-11-24 NOTE — Progress Notes (Signed)
[place] 13+2 wks,measurements c/w dates,crl 64.68 mm,normal right ovary, simple left corpus luteal cyst 3.1 cm,fhr 140 bpm,NB present,NT 1.9 mm,anterior placenta gr 0

## 2018-11-24 NOTE — Patient Instructions (Signed)
 First Trimester of Pregnancy The first trimester of pregnancy is from week 1 until the end of week 12 (months 1 through 3). A week after a sperm fertilizes an egg, the egg will implant on the wall of the uterus. This embryo will begin to develop into a baby. Genes from you and your partner are forming the baby. The female genes determine whether the baby is a boy or a girl. At 6-8 weeks, the eyes and face are formed, and the heartbeat can be seen on ultrasound. At the end of 12 weeks, all the baby's organs are formed.  Now that you are pregnant, you will want to do everything you can to have a healthy baby. Two of the most important things are to get good prenatal care and to follow your health care provider's instructions. Prenatal care is all the medical care you receive before the baby's birth. This care will help prevent, find, and treat any problems during the pregnancy and childbirth. BODY CHANGES Your body goes through many changes during pregnancy. The changes vary from woman to woman.   You may gain or lose a couple of pounds at first.  You may feel sick to your stomach (nauseous) and throw up (vomit). If the vomiting is uncontrollable, call your health care provider.  You may tire easily.  You may develop headaches that can be relieved by medicines approved by your health care provider.  You may urinate more often. Painful urination may mean you have a bladder infection.  You may develop heartburn as a result of your pregnancy.  You may develop constipation because certain hormones are causing the muscles that push waste through your intestines to slow down.  You may develop hemorrhoids or swollen, bulging veins (varicose veins).  Your breasts may begin to grow larger and become tender. Your nipples may stick out more, and the tissue that surrounds them (areola) may become darker.  Your gums may bleed and may be sensitive to brushing and flossing.  Dark spots or blotches  (chloasma, mask of pregnancy) may develop on your face. This will likely fade after the baby is born.  Your menstrual periods will stop.  You may have a loss of appetite.  You may develop cravings for certain kinds of food.  You may have changes in your emotions from day to day, such as being excited to be pregnant or being concerned that something may go wrong with the pregnancy and baby.  You may have more vivid and strange dreams.  You may have changes in your hair. These can include thickening of your hair, rapid growth, and changes in texture. Some women also have hair loss during or after pregnancy, or hair that feels dry or thin. Your hair will most likely return to normal after your baby is born. WHAT TO EXPECT AT YOUR PRENATAL VISITS During a routine prenatal visit:  You will be weighed to make sure you and the baby are growing normally.  Your blood pressure will be taken.  Your abdomen will be measured to track your baby's growth.  The fetal heartbeat will be listened to starting around week 10 or 12 of your pregnancy.  Test results from any previous visits will be discussed. Your health care provider may ask you:  How you are feeling.  If you are feeling the baby move.  If you have had any abnormal symptoms, such as leaking fluid, bleeding, severe headaches, or abdominal cramping.  If you have any questions. Other   tests that may be performed during your first trimester include:  Blood tests to find your blood type and to check for the presence of any previous infections. They will also be used to check for low iron levels (anemia) and Rh antibodies. Later in the pregnancy, blood tests for diabetes will be done along with other tests if problems develop.  Urine tests to check for infections, diabetes, or protein in the urine.  An ultrasound to confirm the proper growth and development of the baby.  An amniocentesis to check for possible genetic problems.  Fetal  screens for spina bifida and Down syndrome.  You may need other tests to make sure you and the baby are doing well. HOME CARE INSTRUCTIONS  Medicines  Follow your health care provider's instructions regarding medicine use. Specific medicines may be either safe or unsafe to take during pregnancy.  Take your prenatal vitamins as directed.  If you develop constipation, try taking a stool softener if your health care provider approves. Diet  Eat regular, well-balanced meals. Choose a variety of foods, such as meat or vegetable-based protein, fish, milk and low-fat dairy products, vegetables, fruits, and whole grain breads and cereals. Your health care provider will help you determine the amount of weight gain that is right for you.  Avoid raw meat and uncooked cheese. These carry germs that can cause birth defects in the baby.  Eating four or five small meals rather than three large meals a day may help relieve nausea and vomiting. If you start to feel nauseous, eating a few soda crackers can be helpful. Drinking liquids between meals instead of during meals also seems to help nausea and vomiting.  If you develop constipation, eat more high-fiber foods, such as fresh vegetables or fruit and whole grains. Drink enough fluids to keep your urine clear or pale yellow. Activity and Exercise  Exercise only as directed by your health care provider. Exercising will help you:  Control your weight.  Stay in shape.  Be prepared for labor and delivery.  Experiencing pain or cramping in the lower abdomen or low back is a good sign that you should stop exercising. Check with your health care provider before continuing normal exercises.  Try to avoid standing for long periods of time. Move your legs often if you must stand in one place for a long time.  Avoid heavy lifting.  Wear low-heeled shoes, and practice good posture.  You may continue to have sex unless your health care provider directs you  otherwise. Relief of Pain or Discomfort  Wear a good support bra for breast tenderness.   Take warm sitz baths to soothe any pain or discomfort caused by hemorrhoids. Use hemorrhoid cream if your health care provider approves.   Rest with your legs elevated if you have leg cramps or low back pain.  If you develop varicose veins in your legs, wear support hose. Elevate your feet for 15 minutes, 3-4 times a day. Limit salt in your diet. Prenatal Care  Schedule your prenatal visits by the twelfth week of pregnancy. They are usually scheduled monthly at first, then more often in the last 2 months before delivery.  Write down your questions. Take them to your prenatal visits.  Keep all your prenatal visits as directed by your health care provider. Safety  Wear your seat belt at all times when driving.  Make a list of emergency phone numbers, including numbers for family, friends, the hospital, and police and fire departments. General   Tips  Ask your health care provider for a referral to a local prenatal education class. Begin classes no later than at the beginning of month 6 of your pregnancy.  Ask for help if you have counseling or nutritional needs during pregnancy. Your health care provider can offer advice or refer you to specialists for help with various needs.  Do not use hot tubs, steam rooms, or saunas.  Do not douche or use tampons or scented sanitary pads.  Do not cross your legs for long periods of time.  Avoid cat litter boxes and soil used by cats. These carry germs that can cause birth defects in the baby and possibly loss of the fetus by miscarriage or stillbirth.  Avoid all smoking, herbs, alcohol, and medicines not prescribed by your health care provider. Chemicals in these affect the formation and growth of the baby.  Schedule a dentist appointment. At home, brush your teeth with a soft toothbrush and be gentle when you floss. SEEK MEDICAL CARE IF:   You have  dizziness.  You have mild pelvic cramps, pelvic pressure, or nagging pain in the abdominal area.  You have persistent nausea, vomiting, or diarrhea.  You have a bad smelling vaginal discharge.  You have pain with urination.  You notice increased swelling in your face, hands, legs, or ankles. SEEK IMMEDIATE MEDICAL CARE IF:   You have a fever.  You are leaking fluid from your vagina.  You have spotting or bleeding from your vagina.  You have severe abdominal cramping or pain.  You have rapid weight gain or loss.  You vomit blood or material that looks like coffee grounds.  You are exposed to German measles and have never had them.  You are exposed to fifth disease or chickenpox.  You develop a severe headache.  You have shortness of breath.  You have any kind of trauma, such as from a fall or a car accident. Document Released: 04/21/2001 Document Revised: 09/11/2013 Document Reviewed: 03/07/2013 ExitCare Patient Information 2015 [employer]. This information is not intended to replace advice given to you by your health care provider. Make sure you discuss any questions you have with your health care provider.   Nausea & Vomiting  Have saltine crackers or pretzels by your bed and eat a few bites before you raise your head out of bed in the morning  Eat small frequent meals throughout the day instead of large meals  Drink plenty of fluids throughout the day to stay hydrated, just don't drink a lot of fluids with your meals.  This can make your stomach fill up faster making you feel sick  Do not brush your teeth right after you eat  Products with real ginger are good for nausea, like ginger ale and ginger hard candy Make sure it says made with real ginger!  Sucking on sour candy like lemon heads is also good for nausea  If your prenatal vitamins make you nauseated, take them at night so you will sleep through the nausea  Sea Bands  If you feel like you need  medicine for the nausea & vomiting please let us know  If you are unable to keep any fluids or food down please let us know   Constipation  Drink plenty of fluid, preferably water, throughout the day  Eat foods high in fiber such as fruits, vegetables, and grains  Exercise, such as walking, is a good way to keep your bowels regular  Drink warm fluids, especially warm   prune juice, or decaf coffee  Eat a 1/2 cup of real oatmeal (not instant), 1/2 cup applesauce, and 1/2-1 cup warm prune juice every day  If needed, you may take Colace (docusate sodium) stool softener once or twice a day to help keep the stool soft. If you are pregnant, wait until you are out of your first trimester (12-14 weeks of pregnancy)  If you still are having problems with constipation, you may take Miralax once daily as needed to help keep your bowels regular.  If you are pregnant, wait until you are out of your first trimester (12-14 weeks of pregnancy)  Safe Medications in Pregnancy   Acne: Benzoyl Peroxide Salicylic Acid  Backache/Headache: Tylenol: 2 regular strength every 4 hours OR              2 Extra strength every 6 hours  Colds/Coughs/Allergies: Benadryl (alcohol free) 25 mg every 6 hours as needed Breath right strips Claritin Cepacol throat lozenges Chloraseptic throat spray Cold-Eeze- up to three times per day Cough drops, alcohol free Flonase (by prescription only) Guaifenesin Mucinex Robitussin DM (plain only, alcohol free) Saline nasal spray/drops Sudafed (pseudoephedrine) & Actifed ** use only after [redacted] weeks gestation and if you do not have high blood pressure Tylenol Vicks Vaporub Zinc lozenges Zyrtec   Constipation: Colace Ducolax suppositories Fleet enema Glycerin suppositories Metamucil Milk of magnesia Miralax Senokot Smooth move tea  Diarrhea: Kaopectate Imodium A-D  *NO pepto Bismol  Hemorrhoids: Anusol Anusol HC Preparation  H Tucks  Indigestion: Tums Maalox Mylanta Zantac  Pepcid  Insomnia: Benadryl (alcohol free) 25mg every 6 hours as needed Tylenol PM Unisom, no Gelcaps  Leg Cramps: Tums MagGel  Nausea/Vomiting:  Bonine Dramamine Emetrol Ginger extract Sea bands Meclizine  Nausea medication to take during pregnancy:  Unisom (doxylamine succinate 25 mg tablets) Take one tablet daily at bedtime. If symptoms are not adequately controlled, the dose can be increased to a maximum recommended dose of two tablets daily (1/2 tablet in the morning, 1/2 tablet mid-afternoon and one at bedtime). Vitamin B6 100mg tablets. Take one tablet twice a day (up to 200 mg per day).  Skin Rashes: Aveeno products Benadryl cream or 25mg every 6 hours as needed Calamine Lotion 1% cortisone cream  Yeast infection: Gyne-lotrimin 7 Monistat 7   **If taking multiple medications, please check labels to avoid duplicating the same active ingredients **take medication as directed on the label ** Do not exceed 4000 mg of tylenol in 24 hours **Do not take medications that contain aspirin or ibuprofen      

## 2018-11-26 LAB — INTEGRATED 1
Crown Rump Length: 64.7 mm
Gest. Age on Collection Date: 12.6 weeks
Maternal Age at EDD: 28.6 yr
Nuchal Translucency (NT): 1.9 mm
Number of Fetuses: 1
PAPP-A Value: 383.2 ng/mL
Weight: 247 [lb_av]

## 2018-11-26 LAB — OBSTETRIC PANEL, INCLUDING HIV
Antibody Screen: NEGATIVE
Basophils Absolute: 0 10*3/uL (ref 0.0–0.2)
Basos: 0 %
EOS (ABSOLUTE): 0.1 10*3/uL (ref 0.0–0.4)
Eos: 1 %
HIV Screen 4th Generation wRfx: NONREACTIVE
Hematocrit: 36.9 % (ref 34.0–46.6)
Hemoglobin: 12.4 g/dL (ref 11.1–15.9)
Hepatitis B Surface Ag: NEGATIVE
Immature Grans (Abs): 0.1 10*3/uL (ref 0.0–0.1)
Immature Granulocytes: 1 %
Lymphocytes Absolute: 2.2 10*3/uL (ref 0.7–3.1)
Lymphs: 16 %
MCH: 27.6 pg (ref 26.6–33.0)
MCHC: 33.6 g/dL (ref 31.5–35.7)
MCV: 82 fL (ref 79–97)
Monocytes Absolute: 0.7 10*3/uL (ref 0.1–0.9)
Monocytes: 5 %
Neutrophils Absolute: 10.9 10*3/uL — ABNORMAL HIGH (ref 1.4–7.0)
Neutrophils: 77 %
Platelets: 252 10*3/uL (ref 150–450)
RBC: 4.49 x10E6/uL (ref 3.77–5.28)
RDW: 12.8 % (ref 11.7–15.4)
RPR Ser Ql: NONREACTIVE
Rh Factor: POSITIVE
Rubella Antibodies, IGG: 1.66 index (ref >0.99)
WBC: 14 10*3/uL — ABNORMAL HIGH (ref 3.4–10.8)

## 2018-11-26 LAB — URINALYSIS, ROUTINE W REFLEX MICROSCOPIC
Bilirubin, UA: NEGATIVE
Glucose, UA: NEGATIVE
Ketones, UA: NEGATIVE
Leukocytes,UA: NEGATIVE
Nitrite, UA: NEGATIVE
Specific Gravity, UA: >=1.03 — AB (ref 1.005–1.030)
Urobilinogen, Ur: 0.2 mg/dL (ref 0.2–1.0)
pH, UA: 5 (ref 5.0–7.5)

## 2018-11-26 LAB — MICROSCOPIC EXAMINATION
Bacteria, UA: NONE SEEN
Casts: NONE SEEN /lpf
Epithelial Cells (non renal): >10 /hpf — AB (ref 0–10)

## 2018-11-26 LAB — SICKLE CELL SCREEN: Sickle Cell Screen: NEGATIVE

## 2018-11-28 ENCOUNTER — Other Ambulatory Visit: Payer: Self-pay | Admitting: Advanced Practice Midwife

## 2018-11-28 LAB — URINE CULTURE

## 2018-11-28 LAB — SPECIMEN STATUS REPORT

## 2018-11-28 MED ORDER — SULFAMETHOXAZOLE-TRIMETHOPRIM 800-160 MG PO TABS: 1.0000 | ORAL_TABLET | Freq: Two times a day (BID) | ORAL | 0 refills | Status: DC

## 2018-11-28 NOTE — Progress Notes (Signed)
Septra for gram neg urine cx

## 2018-12-29 ENCOUNTER — Ambulatory Visit (INDEPENDENT_AMBULATORY_CARE_PROVIDER_SITE_OTHER): Payer: Medicaid Other | Admitting: Women's Health

## 2018-12-29 ENCOUNTER — Encounter: Payer: Self-pay | Admitting: Women's Health

## 2018-12-29 ENCOUNTER — Ambulatory Visit (INDEPENDENT_AMBULATORY_CARE_PROVIDER_SITE_OTHER): Payer: Medicaid Other

## 2018-12-29 ENCOUNTER — Other Ambulatory Visit: Payer: Self-pay

## 2018-12-29 VITALS — BP 104/69 | HR 92 | Wt 245.0 lb

## 2018-12-29 DIAGNOSIS — Z3A18 18 weeks gestation of pregnancy: Secondary | ICD-10-CM | POA: Diagnosis not present

## 2018-12-29 DIAGNOSIS — Z363 Encounter for antenatal screening for malformations: Secondary | ICD-10-CM | POA: Diagnosis not present

## 2018-12-29 DIAGNOSIS — Z1389 Encounter for screening for other disorder: Secondary | ICD-10-CM

## 2018-12-29 DIAGNOSIS — Z362 Encounter for other antenatal screening follow-up: Secondary | ICD-10-CM

## 2018-12-29 DIAGNOSIS — Z331 Pregnant state, incidental: Secondary | ICD-10-CM

## 2018-12-29 DIAGNOSIS — Z3482 Encounter for supervision of other normal pregnancy, second trimester: Secondary | ICD-10-CM

## 2018-12-29 DIAGNOSIS — Z1379 Encounter for other screening for genetic and chromosomal anomalies: Secondary | ICD-10-CM

## 2018-12-29 DIAGNOSIS — Z98891 History of uterine scar from previous surgery: Secondary | ICD-10-CM

## 2018-12-29 LAB — POCT URINALYSIS DIPSTICK OB
Blood, UA: NEGATIVE
Glucose, UA: NEGATIVE
Ketones, UA: NEGATIVE
Leukocytes, UA: NEGATIVE
Nitrite, UA: NEGATIVE
POC,PROTEIN,UA: NEGATIVE

## 2018-12-29 NOTE — Patient Instructions (Signed)
Kimberly May, I greatly value your feedback.  If you receive a survey following your visit with [place] today, we appreciate you taking the time to fill it out.  Thanks, Knute Neu, CNM, [facility]  [place]!!! It is now [place] at [facility] ([place], [place] [address]) Entrance located off of [address] parking   Go to [email or web address] to register for FREE online childbirth classes  [place] Pediatricians/Family Doctors:  [facility] [place] [phone]                 [place] [phone] (usually not accepting new patients unless you have family there already, you are always welcome to call and ask)       [facility] Department [phone]       [facility] Pediatricians/Family Doctors:   [facility]: [phone]  [facility]: [phone]  [facility]: [place] Doctors:   [facility]: [facility]: [place]:  [place]: [phone]    Home Blood Pressure Monitoring for Patients   Your provider has recommended that you check your blood pressure (BP) at least once a week at home. If you do not have a blood pressure cuff at home, one will be provided for you. Contact your provider if you have not received your monitor within 1 week.   Helpful Tips for Accurate Home Blood Pressure Checks  . Don't smoke, exercise, or drink caffeine 30 minutes before checking your BP . Use the restroom before checking your BP (a full bladder can raise your pressure) . Relax in a comfortable upright chair . Feet on the ground . Left arm resting comfortably on a flat surface at the level of your heart . Legs uncrossed . Back supported . Sit quietly and don't talk . Place the cuff on  your bare arm . Adjust snuggly, so that only two fingertips can fit between your skin and the top of the cuff . Check 2 readings separated by at least one minute . Keep a log of your BP readings . For a visual, please reference this diagram: http://ccnc.care/bpdiagram  Provider Name: [facility]     Phone: [phone]  Zone 1: ALL CLEAR  Continue to monitor your symptoms:  . BP reading is less than 140 (top number) or less than 90 (bottom number)  . No right upper stomach pain . No headaches or seeing spots . No feeling nauseated or throwing up . No swelling in face and hands  Zone 2: CAUTION Call your doctor's office for any of the following:  . BP reading is greater than 140 (top number) or greater than 90 (bottom number)  . Stomach pain under your ribs in the middle or right side . Headaches or seeing spots . Feeling nauseated or throwing up . Swelling in face and hands  Zone 3: EMERGENCY  Seek immediate medical care if you have any of the following:  . BP reading is greater than160 (top number) or greater than 110 (bottom number) . Severe headaches not improving with Tylenol . Serious difficulty catching your breath . Any worsening symptoms from Zone 2     Second Trimester of Pregnancy The second trimester is from week 14 through week 27 (months 4 through 6). The second trimester is often  a time when you feel your best. Your body has adjusted to being pregnant, and you begin to feel better physically. Usually, morning sickness has lessened or quit completely, you may have more energy, and you may have an increase in appetite. The second trimester is also a time when the fetus is growing rapidly. At the end of the sixth month, the fetus is about 9 inches long and weighs about 1 pounds. You will likely begin to feel the baby move (quickening) between 16 and 20 weeks of pregnancy. Body changes during your second trimester Your body continues to go through many changes  during your second trimester. The changes vary from woman to woman.  Your weight will continue to increase. You will notice your lower abdomen bulging out.  You may begin to get stretch marks on your hips, abdomen, and breasts.  You may develop headaches that can be relieved by medicines. The medicines should be approved by your health care provider.  You may urinate more often because the fetus is pressing on your bladder.  You may develop or continue to have heartburn as a result of your pregnancy.  You may develop constipation because certain hormones are causing the muscles that push waste through your intestines to slow down.  You may develop hemorrhoids or swollen, bulging veins (varicose veins).  You may have back pain. This is caused by: ? Weight gain. ? Pregnancy hormones that are relaxing the joints in your pelvis. ? A shift in weight and the muscles that support your balance.  Your breasts will continue to grow and they will continue to become tender.  Your gums may bleed and may be sensitive to brushing and flossing.  Dark spots or blotches (chloasma, mask of pregnancy) may develop on your face. This will likely fade after the baby is born.  A dark line from your belly button to the pubic area (linea nigra) may appear. This will likely fade after the baby is born.  You may have changes in your hair. These can include thickening of your hair, rapid growth, and changes in texture. Some women also have hair loss during or after pregnancy, or hair that feels dry or thin. Your hair will most likely return to normal after your baby is born.  What to expect at prenatal visits During a routine prenatal visit:  You will be weighed to make sure you and the fetus are growing normally.  Your blood pressure will be taken.  Your abdomen will be measured to track your baby's growth.  The fetal heartbeat will be listened to.  Any test results from the previous visit will be  discussed.  Your health care provider may ask you:  How you are feeling.  If you are feeling the baby move.  If you have had any abnormal symptoms, such as leaking fluid, bleeding, severe headaches, or abdominal cramping.  If you are using any tobacco products, including cigarettes, chewing tobacco, and electronic cigarettes.  If you have any questions.  Other tests that may be performed during your second trimester include:  Blood tests that check for: ? Low iron levels (anemia). ? High blood sugar that affects pregnant women (gestational diabetes) between 16 and 28 weeks. ? Rh antibodies. This is to check for a protein on red blood cells (Rh factor).  Urine tests to check for infections, diabetes, or protein in the urine.  An ultrasound to confirm the proper growth and development of the baby.  An amniocentesis to check  for possible genetic problems.  Fetal screens for spina bifida and Down syndrome.  HIV (human immunodeficiency virus) testing. Routine prenatal testing includes screening for HIV, unless you choose not to have this test.  Follow these instructions at home: Medicines  Follow your health care provider's instructions regarding medicine use. Specific medicines may be either safe or unsafe to take during pregnancy.  Take a prenatal vitamin that contains at least 600 micrograms (mcg) of folic acid.  If you develop constipation, try taking a stool softener if your health care provider approves. Eating and drinking  Eat a balanced diet that includes fresh fruits and vegetables, whole grains, good sources of protein such as meat, eggs, or tofu, and low-fat dairy. Your health care provider will help you determine the amount of weight gain that is right for you.  Avoid raw meat and uncooked cheese. These carry germs that can cause birth defects in the baby.  If you have low calcium intake from food, talk to your health care provider about whether you should take a  daily calcium supplement.  Limit foods that are high in fat and processed sugars, such as fried and sweet foods.  To prevent constipation: ? Drink enough fluid to keep your urine clear or pale yellow. ? Eat foods that are high in fiber, such as fresh fruits and vegetables, whole grains, and beans. Activity  Exercise only as directed by your health care provider. Most women can continue their usual exercise routine during pregnancy. Try to exercise for 30 minutes at least 5 days a week. Stop exercising if you experience uterine contractions.  Avoid heavy lifting, wear low heel shoes, and practice good posture.  A sexual relationship may be continued unless your health care provider directs you otherwise. Relieving pain and discomfort  Wear a good support bra to prevent discomfort from breast tenderness.  Take warm sitz baths to soothe any pain or discomfort caused by hemorrhoids. Use hemorrhoid cream if your health care provider approves.  Rest with your legs elevated if you have leg cramps or low back pain.  If you develop varicose veins, wear support hose. Elevate your feet for 15 minutes, 3-4 times a day. Limit salt in your diet. Prenatal Care  Write down your questions. Take them to your prenatal visits.  Keep all your prenatal visits as told by your health care provider. This is important. Safety  Wear your seat belt at all times when driving.  Make a list of emergency phone numbers, including numbers for family, friends, the hospital, and police and fire departments. General instructions  Ask your health care provider for a referral to a local prenatal education class. Begin classes no later than the beginning of month 6 of your pregnancy.  Ask for help if you have counseling or nutritional needs during pregnancy. Your health care provider can offer advice or refer you to specialists for help with various needs.  Do not use hot tubs, steam rooms, or saunas.  Do not  douche or use tampons or scented sanitary pads.  Do not cross your legs for long periods of time.  Avoid cat litter boxes and soil used by cats. These carry germs that can cause birth defects in the baby and possibly loss of the fetus by miscarriage or stillbirth.  Avoid all smoking, herbs, alcohol, and unprescribed drugs. Chemicals in these products can affect the formation and growth of the baby.  Do not use any products that contain nicotine or tobacco, such as cigarettes  and e-cigarettes. If you need help quitting, ask your health care provider.  Visit your dentist if you have not gone yet during your pregnancy. Use a soft toothbrush to brush your teeth and be gentle when you floss. Contact a health care provider if:  You have dizziness.  You have mild pelvic cramps, pelvic pressure, or nagging pain in the abdominal area.  You have persistent nausea, vomiting, or diarrhea.  You have a bad smelling vaginal discharge.  You have pain when you urinate. Get help right away if:  You have a fever.  You are leaking fluid from your vagina.  You have spotting or bleeding from your vagina.  You have severe abdominal cramping or pain.  You have rapid weight gain or weight loss.  You have shortness of breath with chest pain.  You notice sudden or extreme swelling of your face, hands, ankles, feet, or legs.  You have not felt your baby move in over an hour.  You have severe headaches that do not go away when you take medicine.  You have vision changes. Summary  The second trimester is from week 14 through week 27 (months 4 through 6). It is also a time when the fetus is growing rapidly.  Your body goes through many changes during pregnancy. The changes vary from woman to woman.  Avoid all smoking, herbs, alcohol, and unprescribed drugs. These chemicals affect the formation and growth your baby.  Do not use any tobacco products, such as cigarettes, chewing tobacco, and  e-cigarettes. If you need help quitting, ask your health care provider.  Contact your health care provider if you have any questions. Keep all prenatal visits as told by your health care provider. This is important. This information is not intended to replace advice given to you by your health care provider. Make sure you discuss any questions you have with your health care provider. Document Released: 04/21/2001 Document Revised: 10/03/2015 Document Reviewed: 06/28/2012 Elsevier Interactive Patient Education  2017 [employer].

## 2018-12-29 NOTE — Progress Notes (Signed)
[place] 25+4 wks,cephalic,anterior placenta gr 0,cx 3.6 cm,svp of fluid 5.8 cm,right ovary not visualized,simple left corpus luteal cyst 2.6 x 2.1 x 2.2 cm,fhr 177 bpm,efw 237 g 50%,limited view of the heart because of pt body habitus,please have pt come back for additional images,no obvious abnormalities

## 2018-12-29 NOTE — Progress Notes (Signed)
   LOW-RISK PREGNANCY VISIT Patient name: Kimberly May MRN [id number]  Date of birth: 15-Dec-1990 Chief Complaint:   Routine Prenatal Visit (u/s- 2 nd IT)  History of Present Illness:   Kimberly May is a 28 y.o. G32P1011 female at [redacted]w[redacted]d with an Estimated Date of Delivery: 05/30/19 being seen today for ongoing management of a low-risk pregnancy.  Today she reports no complaints. Wants TOLAC.  Contractions: Not present.  .   . denies leaking of fluid. Review of Systems:   Pertinent items are noted in HPI Denies abnormal vaginal discharge w/ itching/odor/irritation, headaches, visual changes, shortness of breath, chest pain, abdominal pain, severe nausea/vomiting, or problems with urination or bowel movements unless otherwise stated above. Pertinent History Reviewed:  Reviewed past medical,surgical, social, obstetrical and family history.  Reviewed problem list, medications and allergies. Physical Assessment:   Vitals:   12/29/18 1048  BP: 104/69  Pulse: 92  Weight: 245 lb (111.1 kg)  Body mass index is 47.85 kg/m.        Physical Examination:   General appearance: Well appearing, and in no distress  Mental status: Alert, oriented to person, place, and time  Skin: Warm & dry  Cardiovascular: Normal heart rate noted  Respiratory: Normal respiratory effort, no distress  Abdomen: Soft, gravid, nontender  Pelvic: Cervical exam deferred         Extremities: Edema: None  Fetal Status:         [place] 65+0 wks,cephalic,anterior placenta gr 0,cx 3.6 cm,svp of fluid 5.8 cm,right ovary not visualized,simple left corpus luteal cyst 2.6 x 2.1 x 2.2 cm,fhr 177 bpm,efw 237 g 50%,limited view of the heart because of pt body habitus,please have pt come back for additional images,no obvious abnormalities    Results for orders placed or performed in visit on 12/29/18 (from the past 24 hour(s))  POC Urinalysis Dipstick OB   Collection Time: 12/29/18 10:54 AM  Result Value Ref Range   Color, UA     Clarity, UA     Glucose, UA Negative Negative   Bilirubin, UA     Ketones, UA neg    Spec Grav, UA     Blood, UA neg    pH, UA     POC,PROTEIN,UA Negative Negative, Trace, Small (1+), Moderate (2+), Large (3+), 4+   Urobilinogen, UA     Nitrite, UA neg    Leukocytes, UA Negative Negative   Appearance     Odor      Assessment & Plan:  1) Low-risk pregnancy G3P1011 at [redacted]w[redacted]d with an Estimated Date of Delivery: 05/30/19   2) Prev c/s, for [facility], wants TOLAC, consent given to take home and review   Meds: No orders of the defined types were placed in this encounter.  Labs/procedures today: anatomy u/s, 2nd IT  Plan:  Continue routine obstetrical care   Reviewed: Preterm labor symptoms and general obstetric precautions including but not limited to vaginal bleeding, contractions, leaking of fluid and fetal movement were reviewed in detail with the patient.  All questions were answered. Has home bp cuff. Check bp weekly, let us know if >140/90.   Follow-up: Return in about 4 weeks (around 01/26/2019) for [place], US:OB F/U heart , in person.  Orders Placed This Encounter  Procedures  . US OB Follow Up  . INTEGRATED 2  . POC Urinalysis Dipstick OB   Roma Schanz CNM, [facility] 12/29/2018 11:53 AM

## 2018-12-31 LAB — INTEGRATED 2
AFP MoM: 0.96
Alpha-Fetoprotein: 24.1 ng/mL
Crown Rump Length: 64.7 mm
DIA MoM: 0.93
DIA Value: 115.9 pg/mL
Estriol, Unconjugated: 1.24 ng/mL
Gest. Age on Collection Date: 12.6 weeks
Gestational Age: 17.6 weeks
Maternal Age at EDD: 28.6 yr
Nuchal Translucency (NT): 1.9 mm
Nuchal Translucency MoM: 1.31
Number of Fetuses: 1
PAPP-A MoM: 0.66
PAPP-A Value: 383.2 ng/mL
Test Results:: NEGATIVE
Weight: 245 [lb_av]
Weight: 247 [lb_av]
hCG MoM: 1.06
hCG Value: 20 IU/mL
uE3 MoM: 1.17

## 2019-01-17 ENCOUNTER — Other Ambulatory Visit: Payer: Self-pay | Admitting: Women's Health

## 2019-01-17 MED ORDER — HYDROCODONE-ACETAMINOPHEN 5-325 MG PO TABS: 1.0000 | ORAL_TABLET | Freq: Four times a day (QID) | ORAL | 0 refills | Status: DC | PRN

## 2019-01-19 ENCOUNTER — Telehealth: Payer: Self-pay | Admitting: Obstetrics & Gynecology

## 2019-01-19 MED ORDER — HYDROCODONE-ACETAMINOPHEN 5-325 MG PO TABS: 1.0000 | ORAL_TABLET | Freq: Four times a day (QID) | ORAL | 0 refills | Status: DC | PRN

## 2019-01-19 MED ORDER — AZITHROMYCIN 250 MG PO TABS: ORAL_TABLET | ORAL | 0 refills | Status: DC

## 2019-01-26 ENCOUNTER — Ambulatory Visit (INDEPENDENT_AMBULATORY_CARE_PROVIDER_SITE_OTHER): Payer: Medicaid Other | Admitting: Women's Health

## 2019-01-26 ENCOUNTER — Encounter: Payer: Self-pay | Admitting: Women's Health

## 2019-01-26 ENCOUNTER — Other Ambulatory Visit: Payer: Self-pay

## 2019-01-26 ENCOUNTER — Ambulatory Visit (INDEPENDENT_AMBULATORY_CARE_PROVIDER_SITE_OTHER): Payer: Medicaid Other

## 2019-01-26 VITALS — BP 109/71 | HR 94 | Wt 243.5 lb

## 2019-01-26 DIAGNOSIS — Z362 Encounter for other antenatal screening follow-up: Secondary | ICD-10-CM

## 2019-01-26 DIAGNOSIS — Z3482 Encounter for supervision of other normal pregnancy, second trimester: Secondary | ICD-10-CM

## 2019-01-26 DIAGNOSIS — Z3481 Encounter for supervision of other normal pregnancy, first trimester: Secondary | ICD-10-CM | POA: Diagnosis not present

## 2019-01-26 DIAGNOSIS — Z3A13 13 weeks gestation of pregnancy: Secondary | ICD-10-CM | POA: Diagnosis not present

## 2019-01-26 DIAGNOSIS — Z331 Pregnant state, incidental: Secondary | ICD-10-CM

## 2019-01-26 DIAGNOSIS — Z3A22 22 weeks gestation of pregnancy: Secondary | ICD-10-CM

## 2019-01-26 DIAGNOSIS — Z1389 Encounter for screening for other disorder: Secondary | ICD-10-CM

## 2019-01-26 LAB — POCT URINALYSIS DIPSTICK OB
Glucose, UA: NEGATIVE
Ketones, UA: NEGATIVE
Nitrite, UA: NEGATIVE
POC,PROTEIN,UA: NEGATIVE

## 2019-01-26 NOTE — Patient Instructions (Signed)
Kimberly May, I greatly value your feedback.  If you receive a survey following your visit with us today, we appreciate you taking the time to fill it out.  Thanks, [name], CNM, WHNP-BC   You will have your sugar test next visit.  Please do not eat or drink anything after midnight the night before you come, not even water.  You will be here for at least two hours.  Please make an appointment online for the bloodwork at [email or web address] for 8:30am (or as close to this as possible). Make sure you select the [facility] service center. The day of the appointment, check in with our office first, then you will go to [facility] to start the sugar test.    [facility]!!! [facility] is now [facility] at [facility] ([address] [place], [place] [address]) Entrance located off of [address] Free 24/7 valet parking  Go to [email or web address] to register for FREE online childbirth classes   Call the office [phone]) or go to [facility] if:  You begin to have strong, frequent contractions  Your water breaks.  Sometimes it is a big gush of fluid, sometimes it is just a trickle that keeps getting your panties wet or running down your legs  You have vaginal bleeding.  It is normal to have a small amount of spotting if your cervix was checked.   You don't feel your baby moving like normal.  If you don't, get you something to eat and drink and lay down and focus on feeling your baby move.   If your baby is still not moving like normal, you should call the office or go to [facility].  [place] Pediatricians/Family Doctors:  [facility] [phone]            [facility] [phone]                 [facility] [phone] (usually not accepting new patients unless you have family there already, you are always welcome to call and ask)       [facility] [phone]       [facility]ians/Family  Doctors:   [facility]: [phone]  [facility]: [phone]  [facility]: [phone]  [facility]:   [facility]: [phone]   [facility]: [phone]  [facility]:  [facility]: [phone]   Home Blood Pressure Monitoring for Patients   Your provider has recommended that you check your blood pressure (BP) at least once a week at home. If you do not have a blood pressure cuff at home, one will be provided for you. Contact your provider if you have not received your monitor within 1 week.   Helpful Tips for Accurate Home Blood Pressure Checks  . Don't smoke, exercise, or drink caffeine 30 minutes before checking your BP . Use the restroom before checking your BP (a full bladder can raise your pressure) . Relax in a comfortable upright chair . Feet on the ground . Left arm resting comfortably on a flat surface at the level of your heart . Legs uncrossed . Back supported . Sit quietly and don't talk . Place the cuff on your bare arm . Adjust snuggly, so that only two fingertips can fit between your skin and the top of the cuff . Check 2 readings separated by at least one minute . Keep a log of your BP readings .  For a visual, please reference this diagram: http://ccnc.care/bpdiagram  Provider Name: [facility]     Phone: [phone]  Zone 1: ALL CLEAR  Continue to monitor your symptoms:  . BP reading is less than 140 (top number) or less than 90 (bottom number)  . No right upper stomach pain . No headaches or seeing spots . No feeling nauseated or throwing up . No swelling in face and hands  Zone 2: CAUTION Call your doctor's office for any of the following:  . BP reading is greater than 140 (top number) or greater than 90 (bottom number)  . Stomach pain under your ribs in the middle or right side . Headaches or seeing spots . Feeling  nauseated or throwing up . Swelling in face and hands  Zone 3: EMERGENCY  Seek immediate medical care if you have any of the following:  . BP reading is greater than160 (top number) or greater than 110 (bottom number) . Severe headaches not improving with Tylenol . Serious difficulty catching your breath . Any worsening symptoms from Zone 2   Second Trimester of Pregnancy The second trimester is from week 13 through week 28, months 4 through 6. The second trimester is often a time when you feel your best. Your body has also adjusted to being pregnant, and you begin to feel better physically. Usually, morning sickness has lessened or quit completely, you may have more energy, and you may have an increase in appetite. The second trimester is also a time when the fetus is growing rapidly. At the end of the sixth month, the fetus is about 9 inches long and weighs about 1 pounds. You will likely begin to feel the baby move (quickening) between 18 and 20 weeks of the pregnancy. BODY CHANGES Your body goes through many changes during pregnancy. The changes vary from woman to woman.   Your weight will continue to increase. You will notice your lower abdomen bulging out.  You may begin to get stretch marks on your hips, abdomen, and breasts.  You may develop headaches that can be relieved by medicines approved by your health care provider.  You may urinate more often because the fetus is pressing on your bladder.  You may develop or continue to have heartburn as a result of your pregnancy.  You may develop constipation because certain hormones are causing the muscles that push waste through your intestines to slow down.  You may develop hemorrhoids or swollen, bulging veins (varicose veins).  You may have back pain because of the weight gain and pregnancy hormones relaxing your joints between the bones in your pelvis and as a result of a shift in weight and the muscles that support your  balance.  Your breasts will continue to grow and be tender.  Your gums may bleed and may be sensitive to brushing and flossing.  Dark spots or blotches (chloasma, mask of pregnancy) may develop on your face. This will likely fade after the baby is born.  A dark line from your belly button to the pubic area (linea nigra) may appear. This will likely fade after the baby is born.  You may have changes in your hair. These can include thickening of your hair, rapid growth, and changes in texture. Some women also have hair loss during or after pregnancy, or hair that feels dry or thin. Your hair will most likely return to normal after your baby is born. WHAT TO EXPECT AT YOUR PRENATAL VISITS During a routine  prenatal visit:  You will be weighed to make sure you and the fetus are growing normally.  Your blood pressure will be taken.  Your abdomen will be measured to track your baby's growth.  The fetal heartbeat will be listened to.  Any test results from the previous visit will be discussed. Your health care provider may ask you:  How you are feeling.  If you are feeling the baby move.  If you have had any abnormal symptoms, such as leaking fluid, bleeding, severe headaches, or abdominal cramping.  If you have any questions. Other tests that may be performed during your second trimester include:  Blood tests that check for:  Low iron levels (anemia).  Gestational diabetes (between 24 and 28 weeks).  Rh antibodies.  Urine tests to check for infections, diabetes, or protein in the urine.  An ultrasound to confirm the proper growth and development of the baby.  An amniocentesis to check for possible genetic problems.  Fetal screens for spina bifida and Down syndrome. HOME CARE INSTRUCTIONS   Avoid all smoking, herbs, alcohol, and unprescribed drugs. These chemicals affect the formation and growth of the baby.  Follow your health care provider's instructions regarding  medicine use. There are medicines that are either safe or unsafe to take during pregnancy.  Exercise only as directed by your health care provider. Experiencing uterine cramps is a good sign to stop exercising.  Continue to eat regular, healthy meals.  Wear a good support bra for breast tenderness.  Do not use hot tubs, steam rooms, or saunas.  Wear your seat belt at all times when driving.  Avoid raw meat, uncooked cheese, cat litter boxes, and soil used by cats. These carry germs that can cause birth defects in the baby.  Take your prenatal vitamins.  Try taking a stool softener (if your health care provider approves) if you develop constipation. Eat more high-fiber foods, such as fresh vegetables or fruit and whole grains. Drink plenty of fluids to keep your urine clear or pale yellow.  Take warm sitz baths to soothe any pain or discomfort caused by hemorrhoids. Use hemorrhoid cream if your health care provider approves.  If you develop varicose veins, wear support hose. Elevate your feet for 15 minutes, 3-4 times a day. Limit salt in your diet.  Avoid heavy lifting, wear low heel shoes, and practice good posture.  Rest with your legs elevated if you have leg cramps or low back pain.  Visit your dentist if you have not gone yet during your pregnancy. Use a soft toothbrush to brush your teeth and be gentle when you floss.  A sexual relationship may be continued unless your health care provider directs you otherwise.  Continue to go to all your prenatal visits as directed by your health care provider. SEEK MEDICAL CARE IF:   You have dizziness.  You have mild pelvic cramps, pelvic pressure, or nagging pain in the abdominal area.  You have persistent nausea, vomiting, or diarrhea.  You have a bad smelling vaginal discharge.  You have pain with urination. SEEK IMMEDIATE MEDICAL CARE IF:   You have a fever.  You are leaking fluid from your vagina.  You have spotting or  bleeding from your vagina.  You have severe abdominal cramping or pain.  You have rapid weight gain or loss.  You have shortness of breath with chest pain.  You notice sudden or extreme swelling of your face, hands, ankles, feet, or legs.  You have not  felt your baby move in over an hour.  You have severe headaches that do not go away with medicine.  You have vision changes. Document Released: 04/21/2001 Document Revised: 05/02/2013 Document Reviewed: 06/28/2012 [facility] Patient Information 2015 [place], [place]. This information is not intended to replace advice given to you by your health care provider. Make sure you discuss any questions you have with your health care provider.

## 2019-01-26 NOTE — Progress Notes (Signed)
   LOW-RISK PREGNANCY VISIT Patient name: Kimberly May MRN [id number]  Date of birth: 06/10/1990 Chief Complaint:   Routine Prenatal Visit ([place] today)  History of Present Illness:   Kimberly Mcclenahan is a 28 y.o. G67P1011 female at [redacted]w[redacted]d with an Estimated Date of Delivery: 05/30/19 being seen today for ongoing management of a low-risk pregnancy.  Today she reports no complaints. Contractions: Not present. Vag. Bleeding: None.  Movement: Present. denies leaking of fluid. Review of Systems:   Pertinent items are noted in HPI Denies abnormal vaginal discharge w/ itching/odor/irritation, headaches, visual changes, shortness of breath, chest pain, abdominal pain, severe nausea/vomiting, or problems with urination or bowel movements unless otherwise stated above. Pertinent History Reviewed:  Reviewed past medical,surgical, social, obstetrical and family history.  Reviewed problem list, medications and allergies. Physical Assessment:   Vitals:   01/26/19 1029  BP: 109/71  Pulse: 94  Weight: 243 lb 8 oz (110.5 kg)  Body mass index is 47.56 kg/m.        Physical Examination:   General appearance: Well appearing, and in no distress  Mental status: Alert, oriented to person, place, and time  Skin: Warm & dry  Cardiovascular: Normal heart rate noted  Respiratory: Normal respiratory effort, no distress  Abdomen: Soft, gravid, nontender  Pelvic: Cervical exam deferred         Extremities: Edema: None  Fetal Status: Fetal Heart Rate (bpm): 152 u/s   Movement: Present    [place] 79+8 wks,cephalic,cx 4 cm,anterior placenta gr 0,normal ovaries bilat,fhr 152 bpm,svp of fluid 7.3 cm,efw 485 g 39%,anatomy complete,limited view because of pt body habitus  Results for orders placed or performed in visit on 01/26/19 (from the past 24 hour(s))  POC Urinalysis Dipstick OB   Collection Time: 01/26/19 10:30 AM  Result Value Ref Range   Color, UA     Clarity, UA     Glucose, UA Negative Negative   Bilirubin, UA      Ketones, UA neg    Spec Grav, UA     Blood, UA trace    pH, UA     POC,PROTEIN,UA Negative Negative, Trace, Small (1+), Moderate (2+), Large (3+), 4+   Urobilinogen, UA     Nitrite, UA neg    Leukocytes, UA Moderate (2+) (A) Negative   Appearance     Odor      Assessment & Plan:  1) Low-risk pregnancy G3P1011 at [redacted]w[redacted]d with an Estimated Date of Delivery: 05/30/19   2) Prev c/s, wants TOLAC   Meds: No orders of the defined types were placed in this encounter.  Labs/procedures today: repeat anatomy u/s  Plan:  Continue routine obstetrical care   Reviewed: Preterm labor symptoms and general obstetric precautions including but not limited to vaginal bleeding, contractions, leaking of fluid and fetal movement were reviewed in detail with the patient.  All questions were answered.   Follow-up: Return in about 4 weeks (around 02/23/2019) for LROB, PN2, in person.  Orders Placed This Encounter  Procedures  . POC Urinalysis Dipstick OB   Roma Schanz CNM, [facility] 01/26/2019 10:52 AM

## 2019-01-26 NOTE — Progress Notes (Signed)
[place] 55+2 wks,cephalic,cx 4 cm,anterior placenta gr 0,norma ovaries bilat,fhr 152 bpm,svp of fluid 7.3 cm,efw 485 g 39%,anatomy complete,limited view because of pt body habitus

## 2019-01-28 LAB — GC/CHLAMYDIA PROBE AMP
Chlamydia trachomatis, NAA: NEGATIVE
Neisseria Gonorrhoeae by PCR: NEGATIVE

## 2019-02-15 ENCOUNTER — Other Ambulatory Visit: Payer: Self-pay | Admitting: *Deleted

## 2019-02-15 DIAGNOSIS — Z20828 Contact with and (suspected) exposure to other viral communicable diseases: Secondary | ICD-10-CM | POA: Diagnosis not present

## 2019-02-15 DIAGNOSIS — Z20822 Contact with and (suspected) exposure to covid-19: Secondary | ICD-10-CM

## 2019-02-17 LAB — NOVEL CORONAVIRUS, NAA: SARS-CoV-2, NAA: NOT DETECTED

## 2019-02-21 ENCOUNTER — Telehealth (INDEPENDENT_AMBULATORY_CARE_PROVIDER_SITE_OTHER): Payer: Medicaid Other | Admitting: Women's Health

## 2019-02-21 ENCOUNTER — Encounter: Payer: Self-pay | Admitting: Women's Health

## 2019-02-21 ENCOUNTER — Other Ambulatory Visit: Payer: Self-pay

## 2019-02-21 DIAGNOSIS — Z3A26 26 weeks gestation of pregnancy: Secondary | ICD-10-CM

## 2019-02-21 DIAGNOSIS — Z3482 Encounter for supervision of other normal pregnancy, second trimester: Secondary | ICD-10-CM

## 2019-02-21 NOTE — Patient Instructions (Addendum)
Kimberly May, I greatly value your feedback.  If you receive a survey following your visit with [place] today, we appreciate you taking the time to fill it out.  Thanks, Knute Neu, CNM, WHNP-BC   You will have your sugar test next visit.  Please do not eat or drink anything after midnight the night before you come, not even water.  You will be here for at least two hours.  Please make an appointment online for the bloodwork at [email or web address] for 8:30am (or as close to this as possible). Make sure you select the [facility] service center. The day of the appointment, check in with our office first, then you will go to [place] to start the sugar test.    [place]!!! It is now [place] at [facility] ([place], [place] [phone]) Entrance located off of [address] parking   Go to [email or web address] to register for FREE online childbirth classes   Call the office [phone]) or go to [facility] if:  You begin to have strong, frequent contractions  Your water breaks.  Sometimes it is a big gush of fluid, sometimes it is just a trickle that keeps getting your panties wet or running down your legs  You have vaginal bleeding.  It is normal to have a small amount of spotting if your cervix was checked.   You don't feel your baby moving like normal.  If you don't, get you something to eat and drink and lay down and focus on feeling your baby move.   If your baby is still not moving like normal, you should call the office or go to [place] Pediatricians/Family Doctors:  [place] [phone]                 [place] [phone] (usually not accepting new patients unless you have family there already, you are always welcome to call and ask)       [facility] Department [phone]       [facility] Pediatricians/Family  Doctors:   [facility]: [phone]  [facility]: [phone]  [facility]: [place] Doctors:   [facility]: [facility]: [place]:  [place]: [phone]   Home Blood Pressure Monitoring for Patients   Your provider has recommended that you check your blood pressure (BP) at least once a week at home. If you do not have a blood pressure cuff at home, one will be provided for you. Contact your provider if you have not received your monitor within 1 week.   Helpful Tips for Accurate Home Blood Pressure Checks  . Don't smoke, exercise, or drink caffeine 30 minutes before checking your BP . Use the restroom before checking your BP (a full bladder can raise your pressure) . Relax in a comfortable upright chair . Feet on the ground . Left arm resting comfortably on a flat surface at the level of your heart . Legs uncrossed . Back supported . Sit quietly and don't talk . Place the cuff on your bare arm . Adjust snuggly, so that only two fingertips can fit between your skin and the top of the cuff . Check 2 readings separated by at least one minute . Keep a log of your BP  readings . For a visual, please reference this diagram: http://ccnc.care/bpdiagram  Provider Name: [facility]     Phone: [phone]  Zone 1: ALL CLEAR  Continue to monitor your symptoms:  . BP reading is less than 140 (top number) or less than 90 (bottom number)  . No right upper stomach pain . No headaches or seeing spots . No feeling nauseated or throwing up . No swelling in face and hands  Zone 2: CAUTION Call your doctor's office for any of the following:  . BP reading is greater than 140 (top number) or greater than 90 (bottom number)  . Stomach pain under your ribs in the middle or right side . Headaches or seeing spots . Feeling  nauseated or throwing up . Swelling in face and hands  Zone 3: EMERGENCY  Seek immediate medical care if you have any of the following:  . BP reading is greater than160 (top number) or greater than 110 (bottom number) . Severe headaches not improving with Tylenol . Serious difficulty catching your breath . Any worsening symptoms from Zone 2   For your symptoms  Humidifier and saline nasal spray for nasal congestion  Regular robitussin, cough drops for cough  Warm salt water gargles for sore throat  Mucinex with lots of water to help you cough up the mucous in your chest if needed  Drink plenty of fluids and stay hydrated!  Wash your hands frequently.  Call if you are not improving by 7-10 days.     Second Trimester of Pregnancy The second trimester is from week 13 through week 28, months 4 through 6. The second trimester is often a time when you feel your best. Your body has also adjusted to being pregnant, and you begin to feel better physically. Usually, morning sickness has lessened or quit completely, you may have more energy, and you may have an increase in appetite. The second trimester is also a time when the fetus is growing rapidly. At the end of the sixth month, the fetus is about 9 inches long and weighs about 1 pounds. You will likely begin to feel the baby move (quickening) between 18 and 20 weeks of the pregnancy. BODY CHANGES Your body goes through many changes during pregnancy. The changes vary from woman to woman.   Your weight will continue to increase. You will notice your lower abdomen bulging out.  You may begin to get stretch marks on your hips, abdomen, and breasts.  You may develop headaches that can be relieved by medicines approved by your health care provider.  You may urinate more often because the fetus is pressing on your bladder.  You may develop or continue to have heartburn as a result of your pregnancy.  You may develop constipation because  certain hormones are causing the muscles that push waste through your intestines to slow down.  You may develop hemorrhoids or swollen, bulging veins (varicose veins).  You may have back pain because of the weight gain and pregnancy hormones relaxing your joints between the bones in your pelvis and as a result of a shift in weight and the muscles that support your balance.  Your breasts will continue to grow and be tender.  Your gums may bleed and may be sensitive to brushing and flossing.  Dark spots or blotches (chloasma, mask of pregnancy) may develop on your face. This will likely fade after the baby is born.  A dark line from your belly button to the pubic area (  linea nigra) may appear. This will likely fade after the baby is born.  You may have changes in your hair. These can include thickening of your hair, rapid growth, and changes in texture. Some women also have hair loss during or after pregnancy, or hair that feels dry or thin. Your hair will most likely return to normal after your baby is born. WHAT TO EXPECT AT YOUR PRENATAL VISITS During a routine prenatal visit:  You will be weighed to make sure you and the fetus are growing normally.  Your blood pressure will be taken.  Your abdomen will be measured to track your baby's growth.  The fetal heartbeat will be listened to.  Any test results from the previous visit will be discussed. Your health care provider may ask you:  How you are feeling.  If you are feeling the baby move.  If you have had any abnormal symptoms, such as leaking fluid, bleeding, severe headaches, or abdominal cramping.  If you have any questions. Other tests that may be performed during your second trimester include:  Blood tests that check for:  Low iron levels (anemia).  Gestational diabetes (between 24 and 28 weeks).  Rh antibodies.  Urine tests to check for infections, diabetes, or protein in the urine.  An ultrasound to confirm the  proper growth and development of the baby.  An amniocentesis to check for possible genetic problems.  Fetal screens for spina bifida and Down syndrome. HOME CARE INSTRUCTIONS   Avoid all smoking, herbs, alcohol, and unprescribed drugs. These chemicals affect the formation and growth of the baby.  Follow your health care provider's instructions regarding medicine use. There are medicines that are either safe or unsafe to take during pregnancy.  Exercise only as directed by your health care provider. Experiencing uterine cramps is a good sign to stop exercising.  Continue to eat regular, healthy meals.  Wear a good support bra for breast tenderness.  Do not use hot tubs, steam rooms, or saunas.  Wear your seat belt at all times when driving.  Avoid raw meat, uncooked cheese, cat litter boxes, and soil used by cats. These carry germs that can cause birth defects in the baby.  Take your prenatal vitamins.  Try taking a stool softener (if your health care provider approves) if you develop constipation. Eat more high-fiber foods, such as fresh vegetables or fruit and whole grains. Drink plenty of fluids to keep your urine clear or pale yellow.  Take warm sitz baths to soothe any pain or discomfort caused by hemorrhoids. Use hemorrhoid cream if your health care provider approves.  If you develop varicose veins, wear support hose. Elevate your feet for 15 minutes, 3-4 times a day. Limit salt in your diet.  Avoid heavy lifting, wear low heel shoes, and practice good posture.  Rest with your legs elevated if you have leg cramps or low back pain.  Visit your dentist if you have not gone yet during your pregnancy. Use a soft toothbrush to brush your teeth and be gentle when you floss.  A sexual relationship may be continued unless your health care provider directs you otherwise.  Continue to go to all your prenatal visits as directed by your health care provider. SEEK MEDICAL CARE IF:    You have dizziness.  You have mild pelvic cramps, pelvic pressure, or nagging pain in the abdominal area.  You have persistent nausea, vomiting, or diarrhea.  You have a bad smelling vaginal discharge.  You have pain  with urination. SEEK IMMEDIATE MEDICAL CARE IF:   You have a fever.  You are leaking fluid from your vagina.  You have spotting or bleeding from your vagina.  You have severe abdominal cramping or pain.  You have rapid weight gain or loss.  You have shortness of breath with chest pain.  You notice sudden or extreme swelling of your face, hands, ankles, feet, or legs.  You have not felt your baby move in over an hour.  You have severe headaches that do not go away with medicine.  You have vision changes. Document Released: 04/21/2001 Document Revised: 05/02/2013 Document Reviewed: 06/28/2012 [facility] Patient Information 2015 [employer] [place], [place]. This information is not intended to replace advice given to you by your health care provider. Make sure you discuss any questions you have with your health care provider.

## 2019-02-21 NOTE — Progress Notes (Signed)
   TELEHEALTH VIRTUAL OBSTETRICS VISIT ENCOUNTER NOTE Patient name: Kimberly May MRN [id number]  Date of birth: 25-Mar-1991  I connected with patient on 02/21/19 at  2:50 PM EDT by MyChart video  and verified that I am speaking with the correct person using two identifiers. Due to COVID-19 recommendations, pt is not currently in our office.    I discussed the limitations, risks, security and privacy concerns of performing an evaluation and management service by telephone and the availability of in person appointments. I also discussed with the patient that there may be a patient responsible charge related to this service. The patient expressed understanding and agreed to proceed.  Chief Complaint:   Routine Prenatal Visit (stuffy nose, fever, loss of taste-COVID test negative-FOB +)  History of Present Illness:   Kimberly May is a 28 y.o. G27P1011 female at [redacted]w[redacted]d with an Estimated Date of Delivery: 05/30/19 being evaluated today for ongoing management of a low-risk pregnancy. FOB tested + for covid Wed 10/7. Pt neg on 10/7. Pt's sx started Thurs 10/8 (day after tested). Cough, stopped up, can't taste or smell, fever w/ sweats, tylenol helps. Going back Sat to be retested.  Today she reports no other complaints. Contractions: Not present. Vag. Bleeding: None.  Movement: Present. denies leaking of fluid. Review of Systems:   Pertinent items are noted in HPI Denies abnormal vaginal discharge w/ itching/odor/irritation, headaches, visual changes, shortness of breath, chest pain, abdominal pain, severe nausea/vomiting, or problems with urination or bowel movements unless otherwise stated above. Pertinent History Reviewed:  Reviewed past medical,surgical, social, obstetrical and family history.  Reviewed problem list, medications and allergies. Physical Assessment:   Vitals:   02/21/19 1444  Weight: 246 lb (111.6 kg)  Body mass index is 48.04 kg/m.        Physical Examination:   General:  Alert,  oriented and cooperative.   Mental Status: Normal mood and affect perceived. Normal judgment and thought content.  Rest of physical exam deferred due to type of encounter  No results found for this or any previous visit (from the past 24 hour(s)).  Assessment & Plan:  1) Pregnancy G3P1011 at [redacted]w[redacted]d with an Estimated Date of Delivery: 05/30/19   2) Covid household exposure, and now symptomatic, neg test 10/7, getting retested this weekend. Quarantine, reschedule PN2 in office visit for 2wks. Reviewed reasons to seek care at hospital.    Meds: No orders of the defined types were placed in this encounter.   Labs/procedures today: none, pn2 cancelled  Plan:  Continue routine obstetrical care.  Does not have home bp cuff. Tish is sending through babyscripts.   Reviewed: Preterm labor symptoms and general obstetric precautions including but not limited to vaginal bleeding, contractions, leaking of fluid and fetal movement were reviewed in detail with the patient. The patient was advised to call back or seek an in-person office evaluation/go to [facility] at [facility] for any urgent or concerning symptoms. All questions were answered. Please refer to After Visit Summary for other counseling recommendations.    I provided 15 minutes of non-face-to-face time during this encounter.  Follow-up: Return in about 2 weeks (around 03/07/2019) for LROB, PN2, in person, MD (to sign TOLAC consent).  No orders of the defined types were placed in this encounter.  [place], [facility] 02/21/2019 3:14 PM

## 2019-02-23 ENCOUNTER — Telehealth: Payer: Medicaid Other | Admitting: Women's Health

## 2019-02-23 ENCOUNTER — Other Ambulatory Visit: Payer: Medicaid Other

## 2019-02-24 DIAGNOSIS — Z3490 Encounter for supervision of normal pregnancy, unspecified, unspecified trimester: Secondary | ICD-10-CM | POA: Diagnosis not present

## 2019-03-07 ENCOUNTER — Other Ambulatory Visit: Payer: Medicaid Other

## 2019-03-08 ENCOUNTER — Other Ambulatory Visit: Payer: Self-pay

## 2019-03-08 ENCOUNTER — Encounter: Payer: Self-pay | Admitting: *Deleted

## 2019-03-08 ENCOUNTER — Ambulatory Visit (INDEPENDENT_AMBULATORY_CARE_PROVIDER_SITE_OTHER): Payer: Medicaid Other | Admitting: Obstetrics and Gynecology

## 2019-03-08 ENCOUNTER — Other Ambulatory Visit: Payer: Medicaid Other

## 2019-03-08 VITALS — BP 119/79 | HR 105 | Wt 243.8 lb

## 2019-03-08 DIAGNOSIS — Z23 Encounter for immunization: Secondary | ICD-10-CM

## 2019-03-08 DIAGNOSIS — Z3490 Encounter for supervision of normal pregnancy, unspecified, unspecified trimester: Secondary | ICD-10-CM | POA: Diagnosis not present

## 2019-03-08 DIAGNOSIS — Z3A28 28 weeks gestation of pregnancy: Secondary | ICD-10-CM | POA: Diagnosis not present

## 2019-03-08 DIAGNOSIS — Z3480 Encounter for supervision of other normal pregnancy, unspecified trimester: Secondary | ICD-10-CM

## 2019-03-08 DIAGNOSIS — Z723 Lack of physical exercise: Secondary | ICD-10-CM

## 2019-03-08 DIAGNOSIS — Z1389 Encounter for screening for other disorder: Secondary | ICD-10-CM

## 2019-03-08 DIAGNOSIS — Z3493 Encounter for supervision of normal pregnancy, unspecified, third trimester: Secondary | ICD-10-CM

## 2019-03-08 DIAGNOSIS — Z331 Pregnant state, incidental: Secondary | ICD-10-CM

## 2019-03-08 DIAGNOSIS — Z98891 History of uterine scar from previous surgery: Secondary | ICD-10-CM

## 2019-03-08 LAB — POCT URINALYSIS DIPSTICK OB
Blood, UA: NEGATIVE
Glucose, UA: NEGATIVE
Ketones, UA: NEGATIVE
Leukocytes, UA: NEGATIVE
Nitrite, UA: NEGATIVE
POC,PROTEIN,UA: NEGATIVE

## 2019-03-08 NOTE — Progress Notes (Addendum)
Patient ID: Kimberly May, female   DOB: 05-24-1990, 28 y.o.   MRN: [id number]    LOW-RISK PREGNANCY VISIT Patient name: Kimberly Chay MRN [id number]  Date of birth: 05-24-1990 Chief Complaint:   Routine Prenatal Visit  History of Present Illness:   Kimberly Pung is a 28 y.o. 443P1011 female at 527w1d with an Estimated Date of Delivery: 05/30/19 being seen today for ongoing management of a low-risk pregnancy. Tested for COVID 02/25/2019, with neg results. First child was c-section due to decrease HR.  Birth control: Will go back to IUD after delivery.  TolaC: Will sign TOLAC form today, was given form to review at her last visit and is ready to sign today.  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 Today she reports no complaints. Contractions: Not present. Vag. Bleeding: None.  Movement: Present. denies leaking of fluid. Review of Systems:   Pertinent items are noted in HPI Denies abnormal vaginal discharge w/ itching/odor/irritation, headaches, visual changes, shortness of breath, chest pain, abdominal pain, severe nausea/vomiting, or problems with urination or bowel movements unless otherwise stated above. Pertinent History Reviewed:  Reviewed past medical,surgical, social, obstetrical and family history.  Reviewed problem list, medications and allergies. Physical Assessment:   Vitals:   03/08/19 0926  BP: 119/79  Pulse: (!) 105  Weight: 243 lb 12.8 oz (110.6 kg)  Body mass index is 47.61 kg/m.        Physical Examination:   General appearance: Well appearing, and in no distress  Mental status: Alert, oriented to person, place, and time  Skin: Warm & dry  Cardiovascular: Normal heart rate noted  Respiratory: Normal respiratory effort, no distress  Abdomen: Soft, gravid, nontender  Pelvic: Cervical exam deferred         Extremities:   physi  Fetal Status:     Movement: Present    Chaperone: Arnette NorrisMari Johnson    Results for orders placed or  performed in visit on 03/08/19 (from the past 24 hour(s))  POC Urinalysis Dipstick OB   Collection Time: 03/08/19  9:25 AM  Result Value Ref Range   Color, UA     Clarity, UA     Glucose, UA Negative Negative   Bilirubin, UA     Ketones, UA n    Spec Grav, UA     Blood, UA n    pH, UA     POC,PROTEIN,UA Negative Negative, Trace, Small (1+), Moderate (2+), Large (3+), 4+   Urobilinogen, UA     Nitrite, UA n    Leukocytes, UA Negative Negative   Appearance     Odor      Assessment & Plan:  1) Low-risk pregnancy G3P1011 at 387w1d with an Estimated Date of Delivery: 05/30/19 previous cesarean section desiring TOLAC with consent now signed   Meds: No orders of the defined types were placed in this encounter.  Labs/procedures today: PN2   TOLAC Consult Note     [facility]  28 y.o. [phone] at 267w1d with Estimated Date of Delivery: 05/30/19 was seen today in office to discuss trial of labor after cesarean section (TOLAC) versus elective repeat cesarean delivery (ERCD). The following risks were discussed with the patient.  Risk of uterine rupture at term is   0.78 percent with TOLAC (7.12/998)  And   0.22 percent with ERCD. (2.06/998)  1 in 10 uterine ruptures will result in neonatal death or  neurological injury to the baby.  The BENEFITS of a trial of labor after cesarean (TOLAC) resulting in a vaginal birth after cesarean (VBAC) include the following:      .shorter length of hospital stay and postpartum recovery (in most cases);      .fewer complications, such as postpartum fever, wound or uterine infection,             thromboembolism (blood clots in the leg or lung), need for blood transfusion      And fewer neonatal breathing problems . The RISKS of an attempted VBAC or TOLAC include the following: Risk of failed trial of labor after cesarean (TOLAC) without a vaginal birth after cesarean (VBAC) resulting in repeat cesarean  delivery (RCD) in about 20 to 40 percent of women who attempt VBAC.  In other words, you may labor, with the goal of vaginal delivery, and still end up needing a cesarean delivery 20 to 40 percent of the time.  Risk of rupture of uterus resulting in an emergency cesarean delivery. The risk of uterine rupture may be related in part to the type of uterine incision made during the first cesarean delivery. A previous transverse uterine incision has the lowest risk of rupture (0.2 to 1.5 percent risk, or 06/998 to 15/1000). Vertical or T-shaped uterine incisions have a higher risk of uterine rupture (4 to 9 percent risk)The risk of fetal death is very low with both VBAC and elective repeat cesarean delivery (ERCD), but the likelihood of fetal death is higher with VBAC than with ERCD. Maternal death is very rare with either type of delivery.  The risks of an elective repeat cesarean delivery (ERCD) were reviewed with the patient including but not limited to: 06/998 risk of uterine rupture which could have serious consequences, bleeding which may require transfusion; infection which may require antibiotics; injury to bowel, bladder or other surrounding organs (bowel, bladder, ureters); injury to the fetus; need for additional procedures including hysterectomy in the event of a life-threatening hemorrhage; thromboembolic phenomenon; abnormal placentation; incisional problems; death and other postoperative or anesthesia complications.  The benefits of elective repeat cesarean deliver (ERCD) reviewed with the patient. These benefits vary from person to person, and may include: convenience of scheduling on a specific date on or after 39 weeks, or other individual patient's concerns.  These risks and benefits are summarized on the consent form, which was reviewed with the patient during the visit.  All her questions answered and she signed a consent indicating a preference for TOLAC/ERCD.   A copy of the consent was  given to the patient and signed today.  ([place] does say that she would NOT want tubal sterilization in the event a cesarean section is performed due to any maternal-fetal indication.)   Will continue routine pregnancy care at [facility].  Plan:  Continue routine obstetrical care, signed TOLAC today with Dr Emelda Fear Next visit: in person    Reviewed: Preterm labor symptoms and general obstetric precautions including but not limited to vaginal bleeding, contractions, leaking of fluid and fetal movement were reviewed in detail with the patient.  All questions were answered.   Follow-up: Return in about 2 weeks (around 03/22/2019) for TOLAC form today.  Orders Placed This Encounter  Procedures  . POC Urinalysis Dipstick OB   By signing my name below, I, Arnette Norris, attest that this documentation  has been prepared under the direction and in the presence of Jonnie Kind, MD. Electronically Signed: [place]. 03/08/19. 9:40 AM.  I personally performed the services described in this documentation, which was SCRIBED in my presence. The recorded information has been reviewed and considered accurate. It has been edited as necessary during review. Jonnie Kind, MD

## 2019-03-08 NOTE — Patient Instructions (Signed)
TOLAC Consult Note     [facility]  28 y.o. [phone] at [redacted]w[redacted]d with Estimated Date of Delivery: 05/30/19 was seen today in office to discuss trial of labor after cesarean section (TOLAC) versus elective repeat cesarean delivery (ERCD). The following risks were discussed with the patient.  Risk of uterine rupture at term is   0.78 percent with TOLAC (7.12/998)                                               And   0.22 percent with ERCD. (2.06/998)  1 in 10 uterine ruptures will result in neonatal death or  neurological injury to the baby.  The BENEFITS of a trial of labor after cesarean (TOLAC) resulting in a vaginal birth after cesarean (VBAC) include the following:      .shorter length of hospital stay and postpartum recovery (in most cases);      .fewer complications, such as postpartum fever, wound or uterine infection,             thromboembolism (blood clots in the leg or lung), need for blood transfusion      And fewer neonatal breathing problems . The RISKS of an attempted VBAC or TOLAC include the following: Risk of failed trial of labor after cesarean (TOLAC) without a vaginal birth after cesarean (VBAC) resulting in repeat cesarean delivery (RCD) in about 20 to 76 percent of women who attempt VBAC.  In other words, you may labor, with the goal of vaginal delivery, and still end up needing a cesarean delivery 20 to 40 percent of the time.  Risk of rupture of uterus resulting in an emergency cesarean delivery. The risk of uterine rupture may be related in part to the type of uterine incision made during the first cesarean delivery. A previous transverse uterine incision has the lowest risk of rupture (0.2 to 1.5 percent risk, or 06/998 to 15/1000). Vertical or T-shaped uterine incisions have a higher risk of uterine rupture (4 to 9 percent risk)The risk of fetal death is very low with both VBAC and elective repeat cesarean delivery (ERCD), but the likelihood of fetal death is higher with  VBAC than with ERCD. Maternal death is very rare with either type of delivery.  The risks of an elective repeat cesarean delivery (ERCD) were reviewed with the patient including but not limited to: 06/998 risk of uterine rupture which could have serious consequences, bleeding which may require transfusion; infection which may require antibiotics; injury to bowel, bladder or other surrounding organs (bowel, bladder, ureters); injury to the fetus; need for additional procedures including hysterectomy in the event of a life-threatening hemorrhage; thromboembolic phenomenon; abnormal placentation; incisional problems; death and other postoperative or anesthesia complications.  The benefits of elective repeat cesarean deliver (ERCD) reviewed with the patient. These benefits vary from person to person, and may include: convenience of scheduling on a specific date on or after 39 weeks, or other individual patient's concerns.  These risks and benefits are summarized on the consent form, which was reviewed with the patient during the visit.  All her questions answered and she signed a consent indicating a preference for TOLAC/ERCD.   A copy of the consent was given to the patient and signed today.  (Kimberly May does say that she would NOT want tubal sterilization in the event a cesarean section is performed due  to any maternal-fetal indication.)

## 2019-03-09 ENCOUNTER — Encounter: Payer: Self-pay | Admitting: Women's Health

## 2019-03-09 DIAGNOSIS — O24419 Gestational diabetes mellitus in pregnancy, unspecified control: Secondary | ICD-10-CM | POA: Insufficient documentation

## 2019-03-09 DIAGNOSIS — O24410 Gestational diabetes mellitus in pregnancy, diet controlled: Secondary | ICD-10-CM | POA: Insufficient documentation

## 2019-03-09 LAB — CBC
Hematocrit: 34.9 % (ref 34.0–46.6)
Hemoglobin: 11.5 g/dL (ref 11.1–15.9)
MCH: 27.3 pg (ref 26.6–33.0)
MCHC: 33 g/dL (ref 31.5–35.7)
MCV: 83 fL (ref 79–97)
Platelets: 255 10*3/uL (ref 150–450)
RBC: 4.21 x10E6/uL (ref 3.77–5.28)
RDW: 13.8 % (ref 11.7–15.4)
WBC: 12.2 10*3/uL — ABNORMAL HIGH (ref 3.4–10.8)

## 2019-03-09 LAB — RPR: RPR Ser Ql: NONREACTIVE

## 2019-03-09 LAB — GLUCOSE TOLERANCE, 2 HOURS W/ 1HR
Glucose, 1 hour: 192 mg/dL — ABNORMAL HIGH (ref 65–179)
Glucose, 2 hour: 115 mg/dL (ref 65–152)
Glucose, Fasting: 131 mg/dL — ABNORMAL HIGH (ref 65–91)

## 2019-03-09 LAB — HIV ANTIBODY (ROUTINE TESTING W REFLEX): HIV Screen 4th Generation wRfx: NONREACTIVE

## 2019-03-09 LAB — ANTIBODY SCREEN: Antibody Screen: NEGATIVE

## 2019-03-10 ENCOUNTER — Encounter: Payer: Self-pay | Admitting: *Deleted

## 2019-03-10 ENCOUNTER — Telehealth: Payer: Self-pay | Admitting: Family Medicine

## 2019-03-10 ENCOUNTER — Other Ambulatory Visit: Payer: Self-pay | Admitting: *Deleted

## 2019-03-10 DIAGNOSIS — O24410 Gestational diabetes mellitus in pregnancy, diet controlled: Secondary | ICD-10-CM

## 2019-03-10 DIAGNOSIS — O0990 Supervision of high risk pregnancy, unspecified, unspecified trimester: Secondary | ICD-10-CM

## 2019-03-10 MED ORDER — ACCU-CHEK FASTCLIX LANCETS MISC: 1.0000 | Freq: Four times a day (QID) | 12 refills | Status: DC

## 2019-03-10 MED ORDER — ACCU-CHEK GUIDE VI STRP: ORAL_STRIP | 12 refills | Status: DC

## 2019-03-10 MED ORDER — ACCU-CHEK GUIDE ME W/DEVICE KIT: 1.0000 | PACK | Freq: Four times a day (QID) | 0 refills | Status: DC

## 2019-03-10 NOTE — Telephone Encounter (Signed)
Attempted to call patient with her diabetes education appointment. No answer, left voicemail with appointment information ( 11/10 @ 8:15 ). Patient instructed to give the office a call be if she needs to reschedule. Reminder mailed.

## 2019-03-16 ENCOUNTER — Other Ambulatory Visit: Payer: Self-pay | Admitting: *Deleted

## 2019-03-16 DIAGNOSIS — O0990 Supervision of high risk pregnancy, unspecified, unspecified trimester: Secondary | ICD-10-CM

## 2019-03-16 DIAGNOSIS — O24410 Gestational diabetes mellitus in pregnancy, diet controlled: Secondary | ICD-10-CM

## 2019-03-16 MED ORDER — ACCU-CHEK SOFT TOUCH LANCETS MISC: 99 refills | Status: DC

## 2019-03-21 ENCOUNTER — Other Ambulatory Visit: Payer: Medicaid Other

## 2019-03-22 ENCOUNTER — Other Ambulatory Visit: Payer: Self-pay

## 2019-03-22 ENCOUNTER — Ambulatory Visit (INDEPENDENT_AMBULATORY_CARE_PROVIDER_SITE_OTHER): Payer: Medicaid Other | Admitting: Advanced Practice Midwife

## 2019-03-22 VITALS — BP 129/83 | HR 119 | Wt 243.0 lb

## 2019-03-22 DIAGNOSIS — O0993 Supervision of high risk pregnancy, unspecified, third trimester: Secondary | ICD-10-CM

## 2019-03-22 DIAGNOSIS — O24415 Gestational diabetes mellitus in pregnancy, controlled by oral hypoglycemic drugs: Secondary | ICD-10-CM

## 2019-03-22 DIAGNOSIS — Z1389 Encounter for screening for other disorder: Secondary | ICD-10-CM

## 2019-03-22 DIAGNOSIS — O24419 Gestational diabetes mellitus in pregnancy, unspecified control: Secondary | ICD-10-CM

## 2019-03-22 DIAGNOSIS — Z3A30 30 weeks gestation of pregnancy: Secondary | ICD-10-CM

## 2019-03-22 DIAGNOSIS — Z331 Pregnant state, incidental: Secondary | ICD-10-CM

## 2019-03-22 DIAGNOSIS — O0990 Supervision of high risk pregnancy, unspecified, unspecified trimester: Secondary | ICD-10-CM

## 2019-03-22 DIAGNOSIS — Z98891 History of uterine scar from previous surgery: Secondary | ICD-10-CM

## 2019-03-22 LAB — POCT URINALYSIS DIPSTICK OB
Blood, UA: NEGATIVE
Glucose, UA: NEGATIVE
Ketones, UA: NEGATIVE
Leukocytes, UA: NEGATIVE
Nitrite, UA: NEGATIVE
POC,PROTEIN,UA: NEGATIVE

## 2019-03-22 MED ORDER — METFORMIN HCL 500 MG PO TABS: 500.0000 mg | ORAL_TABLET | Freq: Every evening | ORAL | 3 refills | Status: DC

## 2019-03-22 NOTE — Patient Instructions (Signed)
Gestational Diabetes Mellitus, Self Care When you have gestational diabetes (gestational diabetes mellitus), you must make sure your blood sugar (glucose) stays in a healthy range. You can do this with:  Nutrition.  Exercise.  Lifestyle changes.  Medicines or insulin, if needed.  Support from your doctors and others. If you get treated for this condition, it may not hurt you or your unborn baby (fetus). If you do not get treated for this condition, it may cause problems that can hurt you or your unborn baby. If you get gestational diabetes, you are:  More likely to get it if you get pregnant again.  More likely to develop type 2 diabetes in the future. How to stay aware of blood sugar   Check your blood sugar every day while you are pregnant. Check it as often as told.  Call your doctor if your blood sugar is above your goal numbers for two tests in a row. Your doctor will set personal treatment goals for you. Generally, you should have these blood sugar levels:  Before meals, or after not eating for a long time (fasting or preprandial): at or below 95 mg/dL (5.3 mmol/L).  After meals (postprandial): ? One hour after a meal: at or below 140 mg/dL (7.8 mmol/L). ? Two hours after a meal: at or below 120 mg/dL (6.7 mmol/L).  A1c (hemoglobin A1c) level: 6-6.5%. How to manage high and low blood sugar Signs of high blood sugar High blood sugar is called hyperglycemia. Know the early signs of high blood sugar. Signs may include:  Feeling: ? Thirsty. ? Hungry. ? Very tired.  Needing to pee (urinate) more than usual.  Blurry vision. Signs of low blood sugar Low blood sugar is called hypoglycemia. This is when blood sugar is at or below 70 mg/dL (3.9 mmol/L). Signs may include:  Feeling: ? Hungry. ? Worried or nervous (anxious). ? Sweaty and clammy. ? Confused. ? Dizzy. ? Sleepy. ? Sick to your stomach (nauseous).  Having: ? A fast heartbeat. ? A headache. ? A change  in your vision. ? Tingling or no feeling (numbness) around your mouth, lips, or tongue. ? Jerky movements that you cannot control (seizure).  Having trouble with: ? Moving (coordination). ? Sleeping. ? Passing out (fainting). ? Getting upset easily (irritability). Treating low blood sugar To treat low blood sugar, eat or drink something sugary right away. If you can think clearly and swallow safely, follow the 15:15 rule:  Take 15 grams of a fast-acting carb (carbohydrate). Talk with your doctor about how much you should take.  Some fast-acting carbs are: ? Sugar tablets (glucose pills). Take 3-4 glucose pills. ? 6-8 pieces of hard candy. ? 4-6 oz (120-150 mL) of fruit juice. ? 4-6 oz (120-150 mL) of regular (not diet) soda. ? 1 Tbsp (15 mL) honey or sugar.  Check your blood sugar 15 minutes after you take the carb.  If your blood sugar is still at or below 70 mg/dL (3.9 mmol/L), take 15 grams of a carb again.  If your blood sugar does not go above 70 mg/dL (3.9 mmol/L) after 3 tries, get help right away.  After your blood sugar goes back to normal, eat a meal or a snack within 1 hour. Treating very low blood sugar If your blood sugar is at or below 54 mg/dL (3 mmol/L), you have very low blood sugar (severe hypoglycemia). This is an emergency. Do not wait to see if the symptoms will go away. Get medical help right  away. Call your local emergency services (911 in the [place] If you have very low blood sugar and you cannot eat or drink, you may need a glucagon shot (injection). A family member or friend should learn how to check your blood sugar and how to give you a glucagon shot. Ask your doctor if you need to have a glucagon shot kit at home. Follow these instructions at home: Medicine  Take your insulin and diabetes medicines as told.  If your doctor says you should take more or less insulin or medicines, do this exactly as told.  Do not run out of insulin or  medicines. Food   Make healthy food choices. These include: ? Chicken, fish, egg whites, and beans. ? Oats, whole wheat, bulgur, brown rice, quinoa, and millet. ? Fresh fruits and vegetables. ? Low-fat dairy products. ? Nuts, avocado, olive oil, and canola oil.  Meet with a food specialist (dietitian). He or she can help you make an eating plan that is right for you.  Follow instructions from your doctor about what you cannot eat or drink.  Drink enough fluid to keep your pee (urine) pale yellow.  Eat healthy snacks between healthy meals.  Keep track of carbs that you eat. Do this by reading food labels and learning food serving sizes.  Follow your sick day plan when you cannot eat or drink normally. Make this plan with your doctor so it is ready to use. Activity  Exercise for 30 or more minutes a day, or as much as your doctor recommends.  Talk with your doctor before you start a new exercise or activity. Your doctor may need to tell you to change: ? How much insulin or medicines you take. ? How much food you eat. Lifestyle  Do not drink alcohol.  Do not use any tobacco products. These include cigarettes, chewing tobacco, and e-cigarettes. If you need help quitting, ask your doctor.  Learn how to deal with stress. If you need help with this, ask your doctor. Body care  Stay up to date with your shots (immunizations).  Brush your teeth and gums two times a day. Floss one or more times a day.  Go to the dentist one or more times every 6 months.  Stay at a healthy weight while you are pregnant. General instructions  Take over-the-counter and prescription medicines only as told by your doctor.  Ask your doctor about risks of high blood pressure in pregnancy (preeclampsia and eclampsia).  Share your diabetes care plan with: ? Your work or school. ? People you live with.  Check your pee for ketones: ? When you are sick. ? As told by your doctor.  Carry a card or  wear jewelry that says you have diabetes.  Keep all follow-up visits as told by your doctor. This is important. Care after giving birth  Have your blood sugar checked 4-12 weeks after you give birth.  Get checked for diabetes one or more times during 3 years. Questions to ask your doctor  Do I need to meet with a diabetes educator?  Where can I find a support group for people with gestational diabetes? Where to find more information To learn more about diabetes, visit:  American Diabetes Association: www.diabetes.org  Centers for Disease Control and Prevention (CDC): www.cdc.gov Summary  Check your blood sugar (glucose) every day while you are pregnant. Check it as often as told.  Take your insulin and diabetes medicines as told.  Keep all follow-up visits as   told by your doctor. This is important.  Have your blood sugar checked 4-12 weeks after you give birth. This information is not intended to replace advice given to you by your health care provider. Make sure you discuss any questions you have with your health care provider. Document Released: 08/19/2015 Document Revised: 10/18/2017 Document Reviewed: 05/31/2015 Elsevier Patient Education  2020 [employer]  

## 2019-03-22 NOTE — Progress Notes (Signed)
HIGH-RISK PREGNANCY VISIT Patient name: Kimberly May MRN [id number]  Date of birth: Aug 31, 1990 Chief Complaint:   Routine Prenatal Visit  History of Present Illness:   Kimberly May is a 28 y.o. G23P1011 female at [redacted]w[redacted]d with an Estimated Date of Delivery: 05/30/19 being seen today for ongoing management of a high-risk pregnancy complicated by diabetes mellitus A1DM.  Has not had diabetic teaching yet- was scheduled for yesterday but she had to miss due to illness; only recently began checking CBGs 4x day- FBS 85-110-122, 2hr 100-129. Is getting more steps in at work, approx 2000. Today she reports no complaints. Contractions: Not present. Vag. Bleeding: None.  Movement: Present. denies leaking of fluid.  Review of Systems:   Pertinent items are noted in HPI Denies abnormal vaginal discharge w/ itching/odor/irritation, headaches, visual changes, shortness of breath, chest pain, abdominal pain, severe nausea/vomiting, or problems with urination or bowel movements unless otherwise stated above. Pertinent History Reviewed:  Reviewed past medical,surgical, social, obstetrical and family history.  Reviewed problem list, medications and allergies. Physical Assessment:   Vitals:   03/22/19 0836  BP: 129/83  Pulse: (!) 119  Weight: 243 lb (110.2 kg)  Body mass index is 47.46 kg/m.           Physical Examination:   General appearance: alert, well appearing, and in no distress  Mental status: alert, oriented to person, place, and time  Skin: warm & dry   Extremities: Edema: None    Cardiovascular: normal heart rate noted  Respiratory: normal respiratory effort, no distress  Abdomen: gravid, soft, non-tender  Pelvic: Cervical exam deferred         Fetal Status: Fetal Heart Rate (bpm): 140 Fundal Height: 34 cm Movement: Present    Fetal Surveillance Testing today: none    Results for orders placed or performed in visit on 03/22/19 (from the past 24 hour(s))  POC Urinalysis Dipstick OB   Collection Time: 03/22/19  8:41 AM  Result Value Ref Range   Color, UA     Clarity, UA     Glucose, UA Negative Negative   Bilirubin, UA     Ketones, UA neg    Spec Grav, UA     Blood, UA neg    pH, UA     POC,PROTEIN,UA Negative Negative, Trace, Small (1+), Moderate (2+), Large (3+), 4+   Urobilinogen, UA     Nitrite, UA neg    Leukocytes, UA Negative Negative   Appearance     Odor      Assessment & Plan:  1) High-risk pregnancy G3P1011 at [redacted]w[redacted]d with an Estimated Date of Delivery: 05/30/19   2) Previous C/S, planning on TOLAC  3) GDM, now A2, with high fastings, per consult with Dr Glo Herring will start Metformin 500 with evening meal; given brief carb teaching and exercise encouragement; strongly encouraged not to miss diabetic teaching scheduled for 11/19 (had to miss yesterday due to illness); growth & BPP with next visit  Meds:  Meds ordered this encounter  Medications  . metFORMIN (GLUCOPHAGE) 500 MG tablet    Sig: Take 1 tablet (500 mg total) by mouth every evening. With dinner    Dispense:  30 tablet    Refill:  3    Labs/procedures today: none  Treatment Plan: Will start antenatal testing @ 32wks with growth/BPP u/s, then 2x week NST or weekly BPP thereafter  Reviewed: Preterm labor symptoms and general obstetric precautions including but not limited to vaginal bleeding, contractions, leaking of fluid and  fetal movement were reviewed in detail with the patient.  All questions were answered. Has home bp cuff. Check bp weekly, let us know if >140/90.   Follow-up: Return in about 2 weeks (around 04/05/2019) for HROB, in person, [place]: EFW; MD.  Orders Placed This Encounter  Procedures  . US OB Follow Up  . US FETAL BPP W/NONSTRESS  . POC Urinalysis Dipstick OB   Arabella Merles CNM 03/22/2019 11:05 AM

## 2019-03-30 ENCOUNTER — Other Ambulatory Visit: Payer: Medicaid Other

## 2019-03-31 ENCOUNTER — Encounter: Payer: Self-pay | Admitting: Family Medicine

## 2019-04-12 ENCOUNTER — Ambulatory Visit (INDEPENDENT_AMBULATORY_CARE_PROVIDER_SITE_OTHER): Payer: Medicaid Other | Admitting: Obstetrics and Gynecology

## 2019-04-12 ENCOUNTER — Other Ambulatory Visit: Payer: Self-pay

## 2019-04-12 ENCOUNTER — Ambulatory Visit (INDEPENDENT_AMBULATORY_CARE_PROVIDER_SITE_OTHER): Payer: Medicaid Other

## 2019-04-12 VITALS — BP 127/81 | HR 100 | Wt 242.4 lb

## 2019-04-12 DIAGNOSIS — O24313 Unspecified pre-existing diabetes mellitus in pregnancy, third trimester: Secondary | ICD-10-CM

## 2019-04-12 DIAGNOSIS — Z362 Encounter for other antenatal screening follow-up: Secondary | ICD-10-CM | POA: Diagnosis not present

## 2019-04-12 DIAGNOSIS — O0990 Supervision of high risk pregnancy, unspecified, unspecified trimester: Secondary | ICD-10-CM

## 2019-04-12 DIAGNOSIS — O24415 Gestational diabetes mellitus in pregnancy, controlled by oral hypoglycemic drugs: Secondary | ICD-10-CM | POA: Diagnosis not present

## 2019-04-12 DIAGNOSIS — O24419 Gestational diabetes mellitus in pregnancy, unspecified control: Secondary | ICD-10-CM

## 2019-04-12 DIAGNOSIS — Z3A33 33 weeks gestation of pregnancy: Secondary | ICD-10-CM | POA: Diagnosis not present

## 2019-04-12 DIAGNOSIS — Z1389 Encounter for screening for other disorder: Secondary | ICD-10-CM

## 2019-04-12 DIAGNOSIS — Z331 Pregnant state, incidental: Secondary | ICD-10-CM

## 2019-04-12 LAB — POCT URINALYSIS DIPSTICK OB
Blood, UA: NEGATIVE
Glucose, UA: NEGATIVE
Ketones, UA: NEGATIVE
Nitrite, UA: NEGATIVE
POC,PROTEIN,UA: NEGATIVE

## 2019-04-12 NOTE — Progress Notes (Signed)
[place] 67+0 wks,cephalic,anterior placenta gr 3,fhr 166 bpm,afi 15 cm,BPP 8/8,EFW 2448 g 81%,limited view

## 2019-04-12 NOTE — Progress Notes (Signed)
Patient ID: Kimberly Woodyard, female   DOB: [birth year], 28 y.o.   MRN: [id number]    [facility] PREGNANCY VISIT Patient name: Kimberly May MRN [id number]  Date of birth: 1990/10/06 Chief Complaint:   Routine Prenatal Visit  History of Present Illness:   Kimberly Decatur is a 28 y.o. G34P1011 female at [redacted]w[redacted]d with an Estimated Date of Delivery: 05/30/19 being seen today for ongoing management of a high-risk pregnancy complicated by high-risk pregnancy complicated by diabetes mellitus A2DM, managed with Metformin 500 daily.   Today she reports that her blood sugars in the morning have been steady at around 145 after breakfast. Usually, she measures her fasting sugars, but she didn't today. She walks about 2,300 steps and drinks almost half a gallon of water daily.   Her first child was delivered by C/S at 41 weeks due to NRFHT, decrease in the baby's heart rate. She was dilated to 5 cm and desires TOLAC.  Contractions: Not present. Vag. Bleeding: None.  Movement: Present. denies leaking of fluid.  Review of Systems:   Pertinent items are noted in HPI Denies abnormal vaginal discharge w/ itching/odor/irritation, headaches, visual changes, shortness of breath, chest pain, abdominal pain, severe nausea/vomiting, or problems with urination or bowel movements unless otherwise stated above. Pertinent History Reviewed:  Reviewed past medical,surgical, social, obstetrical and family history.  Reviewed problem list, medications and allergies. Physical Assessment:   Vitals:   04/12/19 1007  BP: 127/81  Pulse: 100  Weight: 242 lb 6.4 oz (110 kg)  Body mass index is 47.34 kg/m.           Physical Examination:   General appearance: alert, well appearing, and in no distress and oriented to person, place, and time  Mental status: alert, oriented to person, place, and time, normal mood, behavior, speech, dress, motor activity, and thought processes, affect appropriate to mood  Skin: warm & dry   Extremities:       Cardiovascular: normal heart rate noted  Respiratory: normal respiratory effort, no distress  Abdomen: gravid, soft, non-tender  Pelvic: Cervical exam deferred         Fetal Status:     Movement: Present    Fetal Surveillance Testing today: [place] 16+0 wks,cephalic,anterior placenta gr 3,fhr 166 bpm,afi 15 cm,BPP 8/8,EFW 2448 g 81%,limited view   Results for orders placed or performed in visit on 04/12/19 (from the past 24 hour(s))  POC Urinalysis Dipstick OB   Collection Time: 04/12/19 10:06 AM  Result Value Ref Range   Color, UA     Clarity, UA     Glucose, UA Negative Negative   Bilirubin, UA     Ketones, UA n    Spec Grav, UA     Blood, UA n    pH, UA     POC,PROTEIN,UA Negative Negative, Trace, Small (1+), Moderate (2+), Large (3+), 4+   Urobilinogen, UA     Nitrite, UA n    Leukocytes, UA Small (1+) (A) Negative   Appearance     Odor      Assessment & Plan:  1) High-risk pregnancy G3P1011 at [redacted]w[redacted]d with an Estimated Date of Delivery: 05/30/19   2) A2DM, will increase Metformin to 500 mg BID starting today.   3) Previous C/S, desires TOLAC, consented 03/08/2019 In [place]  Meds: No orders of the defined types were placed in this encounter.  Labs/procedures today: none  Treatment Plan:  Began antenatal testing @ 32wks with weekly BPP u/s   Reviewed: Preterm labor symptoms and  general obstetric precautions including but not limited to vaginal bleeding, contractions, leaking of fluid and fetal movement were reviewed in detail with the patient.  All questions were answered. Has home BP cuff Check bp weekly, let us know if >140/90.   Follow-up: Return in about 1 week (around 04/19/2019) for HROB, U/S: BPP.  Orders Placed This Encounter  Procedures  . POC Urinalysis Dipstick OB   By signing my name below, I, Pietro Cassis, attest that this documentation has been prepared under the direction and in the presence of Tilda Burrow, MD. Electronically Signed: Pietro Cassis,  Medical Scribe. 04/12/19. 11:26 AM.  I personally performed the services described in this documentation, which was SCRIBED in my presence. The recorded information has been reviewed and considered accurate. It has been edited as necessary during review. Tilda Burrow, MD

## 2019-04-18 ENCOUNTER — Other Ambulatory Visit: Payer: Self-pay | Admitting: Women's Health

## 2019-04-19 ENCOUNTER — Other Ambulatory Visit: Payer: Self-pay | Admitting: Obstetrics and Gynecology

## 2019-04-19 DIAGNOSIS — O24419 Gestational diabetes mellitus in pregnancy, unspecified control: Secondary | ICD-10-CM

## 2019-04-20 ENCOUNTER — Encounter: Payer: Self-pay | Admitting: Women's Health

## 2019-04-20 ENCOUNTER — Ambulatory Visit (INDEPENDENT_AMBULATORY_CARE_PROVIDER_SITE_OTHER): Payer: Medicaid Other

## 2019-04-20 ENCOUNTER — Other Ambulatory Visit: Payer: Self-pay

## 2019-04-20 ENCOUNTER — Ambulatory Visit (INDEPENDENT_AMBULATORY_CARE_PROVIDER_SITE_OTHER): Payer: Medicaid Other | Admitting: Women's Health

## 2019-04-20 VITALS — BP 105/74 | HR 107 | Wt 240.4 lb

## 2019-04-20 DIAGNOSIS — O24419 Gestational diabetes mellitus in pregnancy, unspecified control: Secondary | ICD-10-CM

## 2019-04-20 DIAGNOSIS — O0990 Supervision of high risk pregnancy, unspecified, unspecified trimester: Secondary | ICD-10-CM

## 2019-04-20 DIAGNOSIS — O0993 Supervision of high risk pregnancy, unspecified, third trimester: Secondary | ICD-10-CM

## 2019-04-20 DIAGNOSIS — Z331 Pregnant state, incidental: Secondary | ICD-10-CM

## 2019-04-20 DIAGNOSIS — Z3A34 34 weeks gestation of pregnancy: Secondary | ICD-10-CM

## 2019-04-20 DIAGNOSIS — Z1389 Encounter for screening for other disorder: Secondary | ICD-10-CM

## 2019-04-20 LAB — POCT URINALYSIS DIPSTICK OB
Blood, UA: NEGATIVE
Glucose, UA: NEGATIVE
Leukocytes, UA: NEGATIVE
Nitrite, UA: NEGATIVE
POC,PROTEIN,UA: NEGATIVE

## 2019-04-20 NOTE — Patient Instructions (Addendum)
Kimberly May, I greatly value your feedback.  If you receive a survey following your visit with [place] today, we appreciate you taking the time to fill it out.  Thanks, Knute Neu, CNM, [facility]  [place]!!! It is now [place] at [facility] ([place], [place] [address]) Entrance located off of [address] parking   Go to [email or web address] to register for FREE online childbirth classes   Increase metformin at night to 1,000mg  (2 pills) Check blood sugars 4 times a day: in the morning before eating/drinking anything (<95) and 2 hours after your first bite of breakfast, lunch, and supper (<120).     Call the office [phone]) or go to [facility] if:  You begin to have strong, frequent contractions  Your water breaks.  Sometimes it is a big gush of fluid, sometimes it is just a trickle that keeps getting your panties wet or running down your legs  You have vaginal bleeding.  It is normal to have a small amount of spotting if your cervix was checked.   You don't feel your baby moving like normal.  If you don't, get you something to eat and drink and lay down and focus on feeling your baby move.  You should feel at least 10 movements in 2 hours.  If you don't, you should call the office or go to [place] Blood Pressure Monitoring for Patients   Your provider has recommended that you check your blood pressure (BP) at least once a week at home. If you do not have a blood pressure cuff at home, one will be provided for you. Contact your provider if you have not received your monitor within 1 week.   Helpful Tips for Accurate Home Blood Pressure Checks  . Don't smoke, exercise, or drink caffeine 30 minutes before checking your BP . Use the restroom before checking your BP (a full bladder can raise your pressure) . Relax in a comfortable upright chair . Feet on the ground . Left arm resting comfortably on a  flat surface at the level of your heart . Legs uncrossed . Back supported . Sit quietly and don't talk . Place the cuff on your bare arm . Adjust snuggly, so that only two fingertips can fit between your skin and the top of the cuff . Check 2 readings separated by at least one minute . Keep a log of your BP readings . For a visual, please reference this diagram: http://ccnc.care/bpdiagram  Provider Name: [facility]     Phone: [phone]  Zone 1: ALL CLEAR  Continue to monitor your symptoms:  . BP reading is less than 140 (top number) or less than 90 (bottom number)  . No right upper stomach pain . No headaches or seeing spots . No feeling nauseated or throwing up . No swelling in face and hands  Zone 2: CAUTION Call your doctor's office for any of the following:  . BP reading is greater than 140 (top number) or greater than 90 (bottom number)  . Stomach pain under your ribs in the middle or right side . Headaches or seeing spots . Feeling nauseated or throwing up . Swelling in face and hands  Zone 3: EMERGENCY  Seek immediate medical care if you have any of the following:  . BP reading is greater than160 (top number) or greater than 110 (bottom number) . Severe headaches not improving with Tylenol .  Serious difficulty catching your breath . Any worsening symptoms from Zone 2    Preterm Labor and Birth Information  The normal length of a pregnancy is 39-41 weeks. Preterm labor is when labor starts before 37 completed weeks of pregnancy. What are the risk factors for preterm labor? Preterm labor is more likely to occur in women who:  Have certain infections during pregnancy such as a bladder infection, sexually transmitted infection, or infection inside the uterus (chorioamnionitis).  Have a shorter-than-normal cervix.  Have gone into preterm labor before.  Have had surgery on their cervix.  Are younger than age 61 or older than age 53.  Are African  American.  Are pregnant with twins or multiple babies (multiple gestation).  Take street drugs or smoke while pregnant.  Do not gain enough weight while pregnant.  Became pregnant shortly after having been pregnant. What are the symptoms of preterm labor? Symptoms of preterm labor include:  Cramps similar to those that can happen during a menstrual period. The cramps may happen with diarrhea.  Pain in the abdomen or lower back.  Regular uterine contractions that may feel like tightening of the abdomen.  A feeling of increased pressure in the pelvis.  Increased watery or bloody mucus discharge from the vagina.  Water breaking (ruptured amniotic sac). Why is it important to recognize signs of preterm labor? It is important to recognize signs of preterm labor because babies who are born prematurely may not be fully developed. This can put them at an increased risk for:  Long-term (chronic) heart and lung problems.  Difficulty immediately after birth with regulating body systems, including blood sugar, body temperature, heart rate, and breathing rate.  Bleeding in the brain.  Cerebral palsy.  Learning difficulties.  Death. These risks are highest for babies who are born before 34 weeks of pregnancy. How is preterm labor treated? Treatment depends on the length of your pregnancy, your condition, and the health of your baby. It may involve:  Having a stitch (suture) placed in your cervix to prevent your cervix from opening too early (cerclage).  Taking or being given medicines, such as: ? Hormone medicines. These may be given early in pregnancy to help support the pregnancy. ? Medicine to stop contractions. ? Medicines to help mature the baby's lungs. These may be prescribed if the risk of delivery is high. ? Medicines to prevent your baby from developing cerebral palsy. If the labor happens before 34 weeks of pregnancy, you may need to stay in the hospital. What should I  do if I think I am in preterm labor? If you think that you are going into preterm labor, call your health care provider right away. How can I prevent preterm labor in future pregnancies? To increase your chance of having a full-term pregnancy:  Do not use any tobacco products, such as cigarettes, chewing tobacco, and e-cigarettes. If you need help quitting, ask your health care provider.  Do not use street drugs or medicines that have not been prescribed to you during your pregnancy.  Talk with your health care provider before taking any herbal supplements, even if you have been taking them regularly.  Make sure you gain a healthy amount of weight during your pregnancy.  Watch for infection. If you think that you might have an infection, get it checked right away.  Make sure to tell your health care provider if you have gone into preterm labor before. This information is not intended to replace advice given to  you by your health care provider. Make sure you discuss any questions you have with your health care provider. Document Released: 07/18/2003 Document Revised: 08/19/2018 Document Reviewed: 09/18/2015 Elsevier Patient Education  2020 [employer]

## 2019-04-20 NOTE — Progress Notes (Signed)
   HIGH-RISK PREGNANCY VISIT Patient name: Kimberly May MRN [id number]  Date of birth: 1990-10-24 Chief Complaint:   High Risk Gestation (U/S)  History of Present Illness:   Kimberly Polidori is a 28 y.o. G70P1011 female at [redacted]w[redacted]d with an Estimated Date of Delivery: 05/30/19 being seen today for ongoing management of a high-risk pregnancy complicated by diabetes mellitus A2DM currently on metformin 500mg  pm.  Today she reports FBS 92-108 (all but 2 >95), only two 2hr pp both of which are normal. Contractions: Not present.  .  Movement: Present. denies leaking of fluid.  Review of Systems:   Pertinent items are noted in HPI Denies abnormal vaginal discharge w/ itching/odor/irritation, headaches, visual changes, shortness of breath, chest pain, abdominal pain, severe nausea/vomiting, or problems with urination or bowel movements unless otherwise stated above. Pertinent History Reviewed:  Reviewed past medical,surgical, social, obstetrical and family history.  Reviewed problem list, medications and allergies. Physical Assessment:   Vitals:   04/20/19 1114  BP: 105/74  Pulse: (!) 107  Weight: 240 lb 6.4 oz (109 kg)  Body mass index is 46.95 kg/m.           Physical Examination:   General appearance: alert, well appearing, and in no distress  Mental status: alert, oriented to person, place, and time  Skin: warm & dry   Extremities: Edema: None    Cardiovascular: normal heart rate noted  Respiratory: normal respiratory effort, no distress  Abdomen: gravid, soft, non-tender  Pelvic: Cervical exam deferred         Fetal Status: Fetal Heart Rate (bpm): 138 u/s   Movement: Present    Fetal Surveillance Testing today: [place] 62+2 wks,cephalic,anterior placenta gr 3,afi 13.6 cm,BPP 8/8,FHR 138 BPM   Chaperone: n/a    Results for orders placed or performed in visit on 04/20/19 (from the past 24 hour(s))  POC Urinalysis Dipstick OB   Collection Time: 04/20/19 11:14 AM  Result Value Ref Range   Color, UA     Clarity, UA     Glucose, UA Negative Negative   Bilirubin, UA     Ketones, UA moderate    Spec Grav, UA     Blood, UA neg    pH, UA     POC,PROTEIN,UA Negative Negative, Trace, Small (1+), Moderate (2+), Large (3+), 4+   Urobilinogen, UA     Nitrite, UA neg    Leukocytes, UA Negative Negative   Appearance     Odor      Assessment & Plan:  1) High-risk pregnancy G3P1011 at [redacted]w[redacted]d with an Estimated Date of Delivery: 05/30/19   2) A2DM, increase metformin to 1,000mg  pm, inadequate info on postprandial to make decision. Start checking QID as directed.   3) Prev c/s, wants TOLAC, consent 10/28  Meds: No orders of the defined types were placed in this encounter.   Labs/procedures today: none  Treatment Plan:  2x/wk testing, q4wk u/s, deliver @ 39wks  Reviewed: Preterm labor symptoms and general obstetric precautions including but not limited to vaginal bleeding, contractions, leaking of fluid and fetal movement were reviewed in detail with the patient.  All questions were answered.   Follow-up: Return for Monday for NST w/ nurse (bring log), then next Thurs for bpp/hrob w/ LHE as scheduled.  Orders Placed This Encounter  Procedures  . US Fetal BPP W/O Non Stress  . POC Urinalysis Dipstick OB   Roma Schanz CNM, [facility] 04/20/2019 11:52 AM

## 2019-04-20 NOTE — Progress Notes (Signed)
[place] 70+3 wks,cephalic,anterior placenta gr 3,afi 13.6 cm,BPP 8/8,FHR 138 BPM

## 2019-04-24 ENCOUNTER — Ambulatory Visit (INDEPENDENT_AMBULATORY_CARE_PROVIDER_SITE_OTHER): Payer: Medicaid Other | Admitting: *Deleted

## 2019-04-24 ENCOUNTER — Other Ambulatory Visit: Payer: Self-pay

## 2019-04-24 VITALS — BP 122/79 | HR 95 | Wt 242.5 lb

## 2019-04-24 DIAGNOSIS — O24419 Gestational diabetes mellitus in pregnancy, unspecified control: Secondary | ICD-10-CM

## 2019-04-24 DIAGNOSIS — O0990 Supervision of high risk pregnancy, unspecified, unspecified trimester: Secondary | ICD-10-CM

## 2019-04-24 DIAGNOSIS — Z331 Pregnant state, incidental: Secondary | ICD-10-CM

## 2019-04-24 DIAGNOSIS — Z1389 Encounter for screening for other disorder: Secondary | ICD-10-CM

## 2019-04-24 DIAGNOSIS — Z3A34 34 weeks gestation of pregnancy: Secondary | ICD-10-CM | POA: Diagnosis not present

## 2019-04-24 LAB — POCT URINALYSIS DIPSTICK OB
Blood, UA: NEGATIVE
Glucose, UA: NEGATIVE
Leukocytes, UA: NEGATIVE
Nitrite, UA: NEGATIVE
POC,PROTEIN,UA: NEGATIVE

## 2019-04-24 NOTE — Progress Notes (Signed)
   NURSE VISIT- NST  SUBJECTIVE:  Kimberly May is a 28 y.o. G14P1011 female at [redacted]w[redacted]d, here for a NST for pregnancy complicated by X7DZ currently on Metformin .  She reports active fetal movement, contractions: none, vaginal bleeding: none, membranes: intact.   OBJECTIVE:  BP 122/79   Pulse 95   Wt 242 lb 8 oz (110 kg)   LMP 08/23/2018   BMI 47.36 kg/m   Appears well, no apparent distress  Results for orders placed or performed in visit on 04/24/19 (from the past 24 hour(s))  POC Urinalysis Dipstick OB   Collection Time: 04/24/19  9:11 AM  Result Value Ref Range   Color, UA     Clarity, UA     Glucose, UA Negative Negative   Bilirubin, UA     Ketones, UA moderate    Spec Grav, UA     Blood, UA neg    pH, UA     POC,PROTEIN,UA Negative Negative, Trace, Small (1+), Moderate (2+), Large (3+), 4+   Urobilinogen, UA     Nitrite, UA neg    Leukocytes, UA Negative Negative   Appearance     Odor      NST: FHR baseline   bpm, Variability: moderate, Accelerations:present, Decelerations:  Absent= Cat 1/Reactive Toco: none   ASSESSMENT: G3P1011 at [redacted]w[redacted]d with A2DM currently on Metformin NST reactive  PLAN: EFM strip reviewed by Dr. Elonda Husky   Recommendations: keep next appointment as scheduled    Alice Rieger  04/24/2019 12:13 PM

## 2019-04-27 ENCOUNTER — Other Ambulatory Visit: Payer: Self-pay

## 2019-04-27 ENCOUNTER — Ambulatory Visit (INDEPENDENT_AMBULATORY_CARE_PROVIDER_SITE_OTHER): Payer: Medicaid Other

## 2019-04-27 ENCOUNTER — Encounter: Payer: Self-pay | Admitting: Obstetrics & Gynecology

## 2019-04-27 ENCOUNTER — Ambulatory Visit (INDEPENDENT_AMBULATORY_CARE_PROVIDER_SITE_OTHER): Payer: Medicaid Other | Admitting: Obstetrics & Gynecology

## 2019-04-27 VITALS — BP 118/74 | HR 94 | Wt 242.0 lb

## 2019-04-27 DIAGNOSIS — O24419 Gestational diabetes mellitus in pregnancy, unspecified control: Secondary | ICD-10-CM | POA: Diagnosis not present

## 2019-04-27 DIAGNOSIS — Z3A35 35 weeks gestation of pregnancy: Secondary | ICD-10-CM | POA: Diagnosis not present

## 2019-04-27 DIAGNOSIS — O24415 Gestational diabetes mellitus in pregnancy, controlled by oral hypoglycemic drugs: Secondary | ICD-10-CM

## 2019-04-27 DIAGNOSIS — O0990 Supervision of high risk pregnancy, unspecified, unspecified trimester: Secondary | ICD-10-CM

## 2019-04-27 DIAGNOSIS — O34219 Maternal care for unspecified type scar from previous cesarean delivery: Secondary | ICD-10-CM

## 2019-04-27 DIAGNOSIS — O0993 Supervision of high risk pregnancy, unspecified, third trimester: Secondary | ICD-10-CM | POA: Diagnosis not present

## 2019-04-27 DIAGNOSIS — Z331 Pregnant state, incidental: Secondary | ICD-10-CM

## 2019-04-27 DIAGNOSIS — Z1389 Encounter for screening for other disorder: Secondary | ICD-10-CM

## 2019-04-27 LAB — POCT URINALYSIS DIPSTICK OB
Blood, UA: NEGATIVE
Glucose, UA: NEGATIVE
Ketones, UA: NEGATIVE
Leukocytes, UA: NEGATIVE
Nitrite, UA: NEGATIVE
POC,PROTEIN,UA: NEGATIVE

## 2019-04-27 MED ORDER — METFORMIN HCL 500 MG PO TABS: 1000.0000 mg | ORAL_TABLET | Freq: Every day | ORAL | 3 refills | Status: DC

## 2019-04-27 NOTE — Progress Notes (Signed)
   HIGH-RISK PREGNANCY VISIT Patient name: Kimberly May MRN [id number]  Date of birth: 28-Apr-1991 Chief Complaint:   Routine Prenatal Visit ([place])  History of Present Illness:   Kimberly May is a 28 y.o. G81P1011 female at [redacted]w[redacted]d with an Estimated Date of Delivery: 05/30/19 being seen today for ongoing management of a high-risk pregnancy complicated by Class A2 DM on metformin 500 pm.  Today she reports no complaints. Contractions: Not present. Vag. Bleeding: None.  Movement: Present. denies leaking of fluid.  Review of Systems:   Pertinent items are noted in HPI Denies abnormal vaginal discharge w/ itching/odor/irritation, headaches, visual changes, shortness of breath, chest pain, abdominal pain, severe nausea/vomiting, or problems with urination or bowel movements unless otherwise stated above. Pertinent History Reviewed:  Reviewed past medical,surgical, social, obstetrical and family history.  Reviewed problem list, medications and allergies. Physical Assessment:   Vitals:   04/27/19 1140  BP: 118/74  Pulse: 94  Weight: 242 lb (109.8 kg)  Body mass index is 47.26 kg/m.           Physical Examination:   General appearance: alert, well appearing, and in no distress  Mental status: alert, oriented to person, place, and time  Skin: warm & dry   Extremities: Edema: None    Cardiovascular: normal heart rate noted  Respiratory: normal respiratory effort, no distress  Abdomen: gravid, soft, non-tender  Pelvic: Cervical exam deferred         Fetal Status:     Movement: Present    Fetal Surveillance Testing today: BPP 8/8   Chaperone: n/a    No results found for this or any previous visit (from the past 24 hour(s)).  Assessment & Plan:  1) High-risk pregnancy G3P1011 at [redacted]w[redacted]d with an Estimated Date of Delivery: 05/30/19   2) Class A2 DM, stable, increase metformin 1000 bedtime  3) Previous C section, desires TOLAC,   Meds:  Meds ordered this encounter  Medications  . DISCONTD:  metFORMIN (GLUCOPHAGE) 500 MG tablet    Sig: Take 2 tablets (1,000 mg total) by mouth at bedtime.    Dispense:  30 tablet    Refill:  3    Labs/procedures today: BPP 8/8  Treatment Plan:  Twice weekly surveillance, delivery timing 39 weeks or as indicated  Reviewed: Term labor symptoms and general obstetric precautions including but not limited to vaginal bleeding, contractions, leaking of fluid and fetal movement were reviewed in detail with the patient.  All questions were answered. Has home bp cuff. Rx faxed to . Check bp weekly, let us know if >140/90.   Follow-up: Return in about 1 week (around 05/04/2019) for BPP/sono + EFW, HROB.  Orders Placed This Encounter  Procedures  . POC Urinalysis Dipstick OB   Florian Buff  09/25/2019 7:11 AM

## 2019-04-27 NOTE — Progress Notes (Signed)
[place] 55+2 wks,cephalic,anterior placenta gr 3,afi 15 cm,fhr 150 bpm,BPP 8/8

## 2019-05-02 ENCOUNTER — Ambulatory Visit (INDEPENDENT_AMBULATORY_CARE_PROVIDER_SITE_OTHER): Payer: Medicaid Other | Admitting: *Deleted

## 2019-05-02 ENCOUNTER — Other Ambulatory Visit: Payer: Self-pay

## 2019-05-02 ENCOUNTER — Encounter: Payer: Self-pay | Admitting: *Deleted

## 2019-05-02 ENCOUNTER — Other Ambulatory Visit: Payer: Medicaid Other | Admitting: Advanced Practice Midwife

## 2019-05-02 VITALS — BP 115/76 | HR 86 | Wt 242.0 lb

## 2019-05-02 DIAGNOSIS — Z3A36 36 weeks gestation of pregnancy: Secondary | ICD-10-CM | POA: Diagnosis not present

## 2019-05-02 DIAGNOSIS — Z331 Pregnant state, incidental: Secondary | ICD-10-CM

## 2019-05-02 DIAGNOSIS — Z1383 Encounter for screening for respiratory disorder NEC: Secondary | ICD-10-CM

## 2019-05-02 DIAGNOSIS — Z1389 Encounter for screening for other disorder: Secondary | ICD-10-CM

## 2019-05-02 DIAGNOSIS — O0990 Supervision of high risk pregnancy, unspecified, unspecified trimester: Secondary | ICD-10-CM

## 2019-05-02 DIAGNOSIS — O24419 Gestational diabetes mellitus in pregnancy, unspecified control: Secondary | ICD-10-CM

## 2019-05-02 LAB — POCT URINALYSIS DIPSTICK OB
Blood, UA: NEGATIVE
Ketones, UA: NEGATIVE
Nitrite, UA: NEGATIVE
POC,PROTEIN,UA: NEGATIVE

## 2019-05-02 NOTE — Progress Notes (Signed)
   NURSE VISIT- NST  SUBJECTIVE:  Kimberly May is a 28 y.o. G48P1011 female at [redacted]w[redacted]d, here for a NST for pregnancy complicated by J8HU currently on Metformin.  She reports active fetal movement, contractions: none, vaginal bleeding: none, membranes: intact.   OBJECTIVE:  BP 115/76   Pulse 86   Wt 242 lb (109.8 kg)   LMP 08/23/2018   BMI 47.26 kg/m   Appears well, no apparent distress  Results for orders placed or performed in visit on 05/02/19 (from the past 24 hour(s))  POC Urinalysis Dipstick OB   Collection Time: 05/02/19 10:48 AM  Result Value Ref Range   Color, UA     Clarity, UA     Glucose, UA Small (1+) (A) Negative   Bilirubin, UA     Ketones, UA neg    Spec Grav, UA     Blood, UA neg    pH, UA     POC,PROTEIN,UA Negative Negative, Trace, Small (1+), Moderate (2+), Large (3+), 4+   Urobilinogen, UA     Nitrite, UA neg    Leukocytes, UA Moderate (2+) (A) Negative   Appearance     Odor      NST: FHR baseline 140 bpm, Variability: moderate, Accelerations:present, Decelerations:  Absent= Cat 1/Reactive Toco: none   ASSESSMENT: G3P1011 at [redacted]w[redacted]d with A2DM currently on Metformin NST reactive  PLAN: EFM strip reviewed by Knute Neu, CNM, [facility]   Recommendations: keep next appointment as scheduled    Levy Pupa  05/02/2019 11:38 AM

## 2019-05-10 ENCOUNTER — Other Ambulatory Visit: Payer: Self-pay | Admitting: Obstetrics & Gynecology

## 2019-05-10 DIAGNOSIS — O24419 Gestational diabetes mellitus in pregnancy, unspecified control: Secondary | ICD-10-CM

## 2019-05-11 ENCOUNTER — Ambulatory Visit (INDEPENDENT_AMBULATORY_CARE_PROVIDER_SITE_OTHER): Payer: Medicaid Other | Admitting: Obstetrics and Gynecology

## 2019-05-11 ENCOUNTER — Ambulatory Visit (INDEPENDENT_AMBULATORY_CARE_PROVIDER_SITE_OTHER): Payer: Medicaid Other

## 2019-05-11 ENCOUNTER — Other Ambulatory Visit (HOSPITAL_COMMUNITY)
Admission: RE | Admit: 2019-05-11 | Discharge: 2019-05-11 | Disposition: A | Discharge status: Home / Self Care | Payer: Medicaid Other | Source: Ambulatory Visit | Attending: Obstetrics and Gynecology | Admitting: Obstetrics and Gynecology

## 2019-05-11 ENCOUNTER — Encounter: Payer: Self-pay | Admitting: Obstetrics and Gynecology

## 2019-05-11 ENCOUNTER — Other Ambulatory Visit: Payer: Self-pay

## 2019-05-11 VITALS — BP 119/84 | HR 97 | Wt 238.6 lb

## 2019-05-11 DIAGNOSIS — O24419 Gestational diabetes mellitus in pregnancy, unspecified control: Secondary | ICD-10-CM | POA: Diagnosis not present

## 2019-05-11 DIAGNOSIS — Z3A37 37 weeks gestation of pregnancy: Secondary | ICD-10-CM

## 2019-05-11 DIAGNOSIS — O0990 Supervision of high risk pregnancy, unspecified, unspecified trimester: Secondary | ICD-10-CM | POA: Insufficient documentation

## 2019-05-11 DIAGNOSIS — Z362 Encounter for other antenatal screening follow-up: Secondary | ICD-10-CM

## 2019-05-11 DIAGNOSIS — Z331 Pregnant state, incidental: Secondary | ICD-10-CM

## 2019-05-11 DIAGNOSIS — O0993 Supervision of high risk pregnancy, unspecified, third trimester: Secondary | ICD-10-CM

## 2019-05-11 DIAGNOSIS — Z1389 Encounter for screening for other disorder: Secondary | ICD-10-CM

## 2019-05-11 LAB — POCT URINALYSIS DIPSTICK OB
Blood, UA: NEGATIVE
Glucose, UA: NEGATIVE
Ketones, UA: NEGATIVE
Nitrite, UA: NEGATIVE
POC,PROTEIN,UA: NEGATIVE

## 2019-05-11 LAB — OB RESULTS CONSOLE GBS: GBS: NEGATIVE

## 2019-05-11 NOTE — Progress Notes (Signed)
Patient ID: Kimberly May, female   DOB: [birth year], 28 y.o.   MRN: [id number]    [facility] PREGNANCY VISIT Patient name: Kimberly Jeffrey MRN [id number]  Date of birth: 07-Aug-1990 Chief Complaint:   High Risk Gestation ([place] today; GBS, GC/CHL)  History of Present Illness:   Kimberly Pothier is a 28 y.o. G69P1011 female at [redacted]w[redacted]d with an Estimated Date of Delivery: 05/30/19 being seen today for ongoing management of a high-risk pregnancy complicated by V3XT, stable with Metformin 1000 mg at night. Today she reports that she has been walking more often and has been getting around 4000 steps/day. Denies itching. Her recent fasting sugars have been 99, 109, 97. All post-meals are under 120. Had one episode of shakiness at 6 pm with cbg of 70.that responded to CHO snack. Contractions: Not present. Vag. Bleeding: None.  Movement: Present. denies leaking of fluid.  Review of Systems:   Pertinent items are noted in HPI Denies abnormal vaginal discharge w/ itching/odor/irritation, headaches, visual changes, shortness of breath, chest pain, abdominal pain, severe nausea/vomiting, or problems with urination or bowel movements unless otherwise stated above. Pertinent History Reviewed:  Reviewed past medical,surgical, social, obstetrical and family history.  Reviewed problem list, medications and allergies. Physical Assessment:   Vitals:   05/11/19 1109  BP: 119/84  Pulse: 97  Weight: 238 lb 9.6 oz (108.2 kg)  Body mass index is 46.6 kg/m.           Physical Examination:   General appearance: alert, well appearing, and in no distress and oriented to person, place, and time  Mental status: alert, oriented to person, place, and time, normal mood, behavior, speech, dress, motor activity, and thought processes, affect appropriate to mood  Skin: warm & dry   Extremities: Edema: None    Cardiovascular: normal heart rate noted  Respiratory: normal respiratory effort, no distress  Abdomen: gravid, soft,  non-tender  Pelvic: Cervical exam performed  Dilation: 1      Fetal Status: Fetal Heart Rate (bpm): 133 u/s Fundal Height: 37 cm Movement: Present Presentation: Vertex Cervix is long and posterior  Fetal Surveillance Testing today: [place] 06+2 wks,cephalic,BPP 6/9,SWNIOEVO placenta gr 3,afi 10.8 cm,fhr 133 bpm,EFW 3047 g 46%  Results for orders placed or performed in visit on 05/11/19 (from the past 24 hour(s))  POC Urinalysis Dipstick OB   Collection Time: 05/11/19 11:10 AM  Result Value Ref Range   Color, UA     Clarity, UA     Glucose, UA Negative Negative   Bilirubin, UA     Ketones, UA neg    Spec Grav, UA     Blood, UA neg    pH, UA     POC,PROTEIN,UA Negative Negative, Trace, Small (1+), Moderate (2+), Large (3+), 4+   Urobilinogen, UA     Nitrite, UA neg    Leukocytes, UA Trace (A) Negative   Appearance     Odor      Assessment & Plan:  1) High-risk pregnancy G3P1011 at [redacted]w[redacted]d with an Estimated Date of Delivery: 05/30/19   2) A2DM, stable with Metformin 1000 mg at night  3) Desires TOLAC, papers have been signed  Meds: No orders of the defined types were placed in this encounter.  Labs/procedures today: GBS, GC/CHL  Treatment Plan:  2x/wk NSTs Tues/Fri, deliver @ 39wks TOLAC  Reviewed: Term labor symptoms and general obstetric precautions including but not limited to vaginal bleeding, contractions, leaking of fluid and fetal movement were reviewed in detail with the patient.  All questions were answered. Has home bp cuff. Check bp, let us know if >140/90.   Follow-up: Return in 5 days (on 05/16/2019) for HROB, NST.  Orders Placed This Encounter  Procedures  . Strep Gp B NAA+Rflx  . POC Urinalysis Dipstick OB   By signing my name below, I, Pietro Cassis, attest that this documentation has been prepared under the direction and in the presence of Tilda Burrow, MD. Electronically Signed: Pietro Cassis, Medical Scribe. 05/11/19. 11:32 AM.  I personally performed the  services described in this documentation, which was SCRIBED in my presence. The recorded information has been reviewed and considered accurate. It has been edited as necessary during review. Tilda Burrow, MD

## 2019-05-11 NOTE — Progress Notes (Signed)
[place] 09+2 wks,cephalic,BPP 9/5,FMBBUYZJ placenta gr 3,afi 10.8 cm,fhr 133 bpm,EFW 3047 g 46%

## 2019-05-13 LAB — STREP GP B NAA+RFLX: Strep Gp B NAA+Rflx: NEGATIVE

## 2019-05-15 ENCOUNTER — Telehealth (INDEPENDENT_AMBULATORY_CARE_PROVIDER_SITE_OTHER): Payer: Self-pay | Admitting: Nurse Practitioner

## 2019-05-15 ENCOUNTER — Encounter (INDEPENDENT_AMBULATORY_CARE_PROVIDER_SITE_OTHER): Payer: Self-pay | Admitting: Nurse Practitioner

## 2019-05-15 NOTE — Telephone Encounter (Signed)
I attempted to call this patient today to discuss having ultrasound of her breast regarding recommended follow-up by radiologist that read the ultrasound of her breast back in July 2020.  At that time it was recommended that she have an ultrasound of her breast in November 2020 to follow-up regarding mass located in the left breast.  Does not appear that she is had follow-up ultrasound of breast.  It does appear that she is currently pregnant.  Unfortunately she did not answer the phone and I was not able to leave a message.  I will try sending a message in my chart.

## 2019-05-16 ENCOUNTER — Telehealth: Payer: Self-pay | Admitting: Obstetrics and Gynecology

## 2019-05-16 ENCOUNTER — Other Ambulatory Visit: Payer: Self-pay

## 2019-05-16 ENCOUNTER — Encounter: Payer: Self-pay | Admitting: Obstetrics and Gynecology

## 2019-05-16 ENCOUNTER — Encounter: Payer: Self-pay | Admitting: *Deleted

## 2019-05-16 ENCOUNTER — Other Ambulatory Visit: Payer: Self-pay | Admitting: Obstetrics and Gynecology

## 2019-05-16 ENCOUNTER — Ambulatory Visit (INDEPENDENT_AMBULATORY_CARE_PROVIDER_SITE_OTHER): Payer: Medicaid Other | Admitting: *Deleted

## 2019-05-16 VITALS — BP 124/83 | HR 92 | Wt 239.8 lb

## 2019-05-16 DIAGNOSIS — Z1389 Encounter for screening for other disorder: Secondary | ICD-10-CM

## 2019-05-16 DIAGNOSIS — Z3A38 38 weeks gestation of pregnancy: Secondary | ICD-10-CM | POA: Diagnosis not present

## 2019-05-16 DIAGNOSIS — Z331 Pregnant state, incidental: Secondary | ICD-10-CM

## 2019-05-16 DIAGNOSIS — O24419 Gestational diabetes mellitus in pregnancy, unspecified control: Secondary | ICD-10-CM

## 2019-05-16 DIAGNOSIS — O24415 Gestational diabetes mellitus in pregnancy, controlled by oral hypoglycemic drugs: Secondary | ICD-10-CM

## 2019-05-16 DIAGNOSIS — O0990 Supervision of high risk pregnancy, unspecified, unspecified trimester: Secondary | ICD-10-CM

## 2019-05-16 LAB — POCT URINALYSIS DIPSTICK OB
Glucose, UA: NEGATIVE
Ketones, UA: NEGATIVE
Nitrite, UA: NEGATIVE
POC,PROTEIN,UA: NEGATIVE

## 2019-05-16 LAB — CERVICOVAGINAL ANCILLARY ONLY
Chlamydia: POSITIVE — AB
Comment: NEGATIVE
Comment: NORMAL
Neisseria Gonorrhea: NEGATIVE

## 2019-05-16 MED ORDER — AZITHROMYCIN 500 MG PO TABS: 1000.0000 mg | ORAL_TABLET | Freq: Once | ORAL | 1 refills | Status: AC

## 2019-05-16 NOTE — Progress Notes (Signed)
   NURSE VISIT- NST  SUBJECTIVE:  Kimberly May is a 29 y.o. G31P1011 female at [redacted]w[redacted]d, here for a NST for pregnancy complicated by A2DM currently on Metformin.  She reports active fetal movement, contractions: none, vaginal bleeding: none, membranes: intact.   OBJECTIVE:  BP 124/83   Pulse 92   Wt 239 lb 12.8 oz (108.8 kg)   LMP 08/23/2018   BMI 46.83 kg/m   Appears well, no apparent distress  Results for orders placed or performed in visit on 05/16/19 (from the past 24 hour(s))  POC Urinalysis Dipstick OB   Collection Time: 05/16/19 11:18 AM  Result Value Ref Range   Color, UA     Clarity, UA     Glucose, UA Negative Negative   Bilirubin, UA     Ketones, UA neg    Spec Grav, UA     Blood, UA trace    pH, UA     POC,PROTEIN,UA Negative Negative, Trace, Small (1+), Moderate (2+), Large (3+), 4+   Urobilinogen, UA     Nitrite, UA neg    Leukocytes, UA Moderate (2+) (A) Negative   Appearance     Odor      NST: FHR baseline 140 bpm, Variability: moderate, Accelerations:present, Decelerations:  Absent= Cat 1/Reactive Toco: none   ASSESSMENT: G3P1011 at [redacted]w[redacted]d with A2DM currently on Metformin NST reactive  PLAN: EFM strip reviewed by Dr. Despina Hidden   Recommendations: keep next appointment as scheduled    Jobe Marker  05/16/2019 4:58 PM

## 2019-05-16 NOTE — Progress Notes (Signed)
+   Chlamydia , on 37 wk screen. Rx Azithromycin 1gm c refil for partner.  Unable to reach patient on 05/16/2019 office notified

## 2019-05-16 NOTE — Progress Notes (Signed)
Please call patient Wednesday. Not available by phone on 1/5.

## 2019-05-17 ENCOUNTER — Encounter: Payer: Self-pay | Admitting: *Deleted

## 2019-05-17 ENCOUNTER — Telehealth: Payer: Self-pay | Admitting: *Deleted

## 2019-05-17 NOTE — Telephone Encounter (Signed)
Kimberly May returned a call, and was informed of her + Chlamydia test , and of her Rx at the pharmacy.

## 2019-05-17 NOTE — Telephone Encounter (Signed)
-----   Message from Tilda Burrow, MD sent at 05/16/2019  6:11 PM EST ----- Please call patient Wednesday. Not available by phone on 1/5.

## 2019-05-17 NOTE — Telephone Encounter (Signed)
Pt aware GBS was negative. JSY

## 2019-05-18 ENCOUNTER — Encounter: Payer: Medicaid Other | Admitting: Obstetrics & Gynecology

## 2019-05-18 ENCOUNTER — Other Ambulatory Visit: Payer: Medicaid Other

## 2019-05-19 ENCOUNTER — Other Ambulatory Visit: Payer: Self-pay

## 2019-05-19 ENCOUNTER — Other Ambulatory Visit: Payer: Self-pay | Admitting: Obstetrics and Gynecology

## 2019-05-19 ENCOUNTER — Telehealth (HOSPITAL_COMMUNITY): Payer: Self-pay | Admitting: *Deleted

## 2019-05-19 ENCOUNTER — Telehealth: Payer: Self-pay | Admitting: Obstetrics and Gynecology

## 2019-05-19 ENCOUNTER — Encounter: Payer: Self-pay | Admitting: Obstetrics & Gynecology

## 2019-05-19 ENCOUNTER — Ambulatory Visit (INDEPENDENT_AMBULATORY_CARE_PROVIDER_SITE_OTHER): Payer: Medicaid Other | Admitting: Obstetrics & Gynecology

## 2019-05-19 VITALS — BP 122/75 | HR 91 | Wt 240.5 lb

## 2019-05-19 DIAGNOSIS — Z3A38 38 weeks gestation of pregnancy: Secondary | ICD-10-CM

## 2019-05-19 DIAGNOSIS — O24415 Gestational diabetes mellitus in pregnancy, controlled by oral hypoglycemic drugs: Secondary | ICD-10-CM | POA: Diagnosis not present

## 2019-05-19 DIAGNOSIS — O0990 Supervision of high risk pregnancy, unspecified, unspecified trimester: Secondary | ICD-10-CM

## 2019-05-19 DIAGNOSIS — Z331 Pregnant state, incidental: Secondary | ICD-10-CM

## 2019-05-19 DIAGNOSIS — O24419 Gestational diabetes mellitus in pregnancy, unspecified control: Secondary | ICD-10-CM

## 2019-05-19 DIAGNOSIS — Z1389 Encounter for screening for other disorder: Secondary | ICD-10-CM

## 2019-05-19 LAB — POCT URINALYSIS DIPSTICK OB
Glucose, UA: NEGATIVE
Ketones, UA: NEGATIVE
Nitrite, UA: NEGATIVE
POC,PROTEIN,UA: NEGATIVE

## 2019-05-19 NOTE — Progress Notes (Signed)
Induction Assessment Scheduling Form Fax to [facility]:  [phone]  [place]                                                                                   DOB:  05/29/1990                                                            MRN:  [id number]                                                                     Phone: [phone]                  [facility]  [facility]  GP:  [id number]                                                            Estimated Date of Delivery: 05/30/19  Dating Criteria: LMP, 8 wk u/s    Medical Indications for induction:  GDMA2, TOLAC Admission Date/Time:  05/25/2019 midnight Gestational age on admission:  39w 2   There were no vitals filed for this visit. HIV:  Non Reactive (10/28 0914) GBS: --Theda Sers (12/31 1400)     Method of induction(proposed):  Outpatient Foley, then Pitocin   Scheduling Provider Signature:  Tilda Burrow, MD                                            Today's Date:  05/19/2019

## 2019-05-19 NOTE — Telephone Encounter (Signed)
Preadmission screen  

## 2019-05-19 NOTE — Progress Notes (Signed)
HIGH-RISK PREGNANCY VISIT Patient name: Kimberly May MRN [id number]  Date of birth: April 17, 1991 Chief Complaint:   High Risk Gestation (NST)  History of Present Illness:   Kimberly May is a 29 y.o. G66P1011 female at 107w3d with an Estimated Date of Delivery: 05/30/19 being seen today for ongoing management of a high-risk pregnancy complicated by Class A2 DM on metformin 1000 mg pm.  Today she reports no complaints. Contractions: Not present. Vag. Bleeding: None.  Movement: Present. denies leaking of fluid.  Review of Systems:   Pertinent items are noted in HPI Denies abnormal vaginal discharge w/ itching/odor/irritation, headaches, visual changes, shortness of breath, chest pain, abdominal pain, severe nausea/vomiting, or problems with urination or bowel movements unless otherwise stated above. Pertinent History Reviewed:  Reviewed past medical,surgical, social, obstetrical and family history.  Reviewed problem list, medications and allergies. Physical Assessment:   Vitals:   05/19/19 0956  BP: 122/75  Pulse: 91  Weight: 240 lb 8 oz (109.1 kg)  Body mass index is 46.97 kg/m.           Physical Examination:   General appearance: alert, well appearing, and in no distress  Mental status: alert, oriented to person, place, and time  Skin: warm & dry   Extremities: Edema: None    Cardiovascular: normal heart rate noted  Respiratory: normal respiratory effort, no distress  Abdomen: gravid, soft, non-tender  Pelvic: Cervical exam deferred         Fetal Status:     Movement: Present    Fetal Surveillance Testing today: reactive NST  Kimberly May is at [redacted]w[redacted]d Estimated Date of Delivery: 05/30/19  NST being performed due to Class A2 DM  Today the NST is Reactive  Fetal Monitoring:  Baseline: 140s bpm, Variability: Good {> 6 bpm), Accelerations: Reactive and Decelerations: Absent   reactive  The accelerations are >15 bpm and more than 2 in 20 minutes  Final diagnosis:  Reactive NST   Florian Buff, MD      Chaperone: n/a    Results for orders placed or performed in visit on 05/19/19 (from the past 24 hour(s))  POC Urinalysis Dipstick OB   Collection Time: 05/19/19  9:57 AM  Result Value Ref Range   Color, UA     Clarity, UA     Glucose, UA Negative Negative   Bilirubin, UA     Ketones, UA neg    Spec Grav, UA     Blood, UA trace    pH, UA     POC,PROTEIN,UA Negative Negative, Trace, Small (1+), Moderate (2+), Large (3+), 4+   Urobilinogen, UA     Nitrite, UA neg    Leukocytes, UA Trace (A) Negative   Appearance     Odor      Assessment & Plan:  1) High-risk pregnancy G3P1011 at [redacted]w[redacted]d with an Estimated Date of Delivery: 05/30/19   2) Class A2 DM, stable  Meds: No orders of the defined types were placed in this encounter.   Labs/procedures today: NST  Treatment Plan:  IOL 39 weeks  Reviewed: Term labor symptoms and general obstetric precautions including but not limited to vaginal bleeding, contractions, leaking of fluid and fetal movement were reviewed in detail with the patient.  All questions were answered. Has home bp cuff. Rx faxed to . Check bp weekly, let us know if >140/90.   Follow-up: Return in about 5 days (around 05/24/2019) for NST, outpatient foley placement , HROB.  Orders Placed This Encounter  Procedures  . POC Urinalysis Dipstick OB   Lazaro Arms  05/19/2019 10:43 AM

## 2019-05-19 NOTE — Telephone Encounter (Signed)
[place] contacted re:iol. TOLAC  IOL posted for [place] 1/14, to arrive 11:45 PM on Wednesday night. Will plan for Foley bulb Wednesday PM at [facility] , with JVF,  Pt had FB with prior labor , "was fine".

## 2019-05-22 ENCOUNTER — Other Ambulatory Visit: Payer: Self-pay | Admitting: Family Medicine

## 2019-05-22 ENCOUNTER — Telehealth (HOSPITAL_COMMUNITY): Payer: Self-pay | Admitting: *Deleted

## 2019-05-22 NOTE — Telephone Encounter (Signed)
Preadmission screen  

## 2019-05-23 ENCOUNTER — Other Ambulatory Visit: Payer: Self-pay

## 2019-05-23 ENCOUNTER — Other Ambulatory Visit (HOSPITAL_COMMUNITY): Payer: Medicaid Other

## 2019-05-23 ENCOUNTER — Other Ambulatory Visit (HOSPITAL_COMMUNITY)
Admission: RE | Admit: 2019-05-23 | Discharge: 2019-05-23 | Disposition: A | Discharge status: Home / Self Care | Payer: Medicaid Other | Source: Ambulatory Visit | Attending: Family Medicine | Admitting: Family Medicine

## 2019-05-23 ENCOUNTER — Other Ambulatory Visit: Payer: Medicaid Other

## 2019-05-23 DIAGNOSIS — Z20822 Contact with and (suspected) exposure to covid-19: Secondary | ICD-10-CM | POA: Insufficient documentation

## 2019-05-23 DIAGNOSIS — Z01812 Encounter for preprocedural laboratory examination: Secondary | ICD-10-CM | POA: Diagnosis present

## 2019-05-23 LAB — SARS CORONAVIRUS 2 (TAT 6-24 HRS): SARS Coronavirus 2: NEGATIVE

## 2019-05-24 ENCOUNTER — Other Ambulatory Visit: Payer: Self-pay | Admitting: Obstetrics and Gynecology

## 2019-05-24 ENCOUNTER — Ambulatory Visit (INDEPENDENT_AMBULATORY_CARE_PROVIDER_SITE_OTHER): Payer: Medicaid Other | Admitting: Obstetrics and Gynecology

## 2019-05-24 ENCOUNTER — Encounter: Payer: Self-pay | Admitting: Obstetrics and Gynecology

## 2019-05-24 ENCOUNTER — Other Ambulatory Visit: Payer: Self-pay | Admitting: Advanced Practice Midwife

## 2019-05-24 ENCOUNTER — Other Ambulatory Visit: Payer: Self-pay

## 2019-05-24 VITALS — BP 114/81 | HR 105 | Wt 239.0 lb

## 2019-05-24 DIAGNOSIS — O24419 Gestational diabetes mellitus in pregnancy, unspecified control: Secondary | ICD-10-CM

## 2019-05-24 DIAGNOSIS — Z3A39 39 weeks gestation of pregnancy: Secondary | ICD-10-CM | POA: Diagnosis not present

## 2019-05-24 DIAGNOSIS — Z1389 Encounter for screening for other disorder: Secondary | ICD-10-CM

## 2019-05-24 DIAGNOSIS — Z331 Pregnant state, incidental: Secondary | ICD-10-CM

## 2019-05-24 DIAGNOSIS — O0990 Supervision of high risk pregnancy, unspecified, unspecified trimester: Secondary | ICD-10-CM

## 2019-05-24 LAB — POCT URINALYSIS DIPSTICK OB
Blood, UA: NEGATIVE
Glucose, UA: NEGATIVE
Ketones, UA: NEGATIVE
Nitrite, UA: NEGATIVE

## 2019-05-24 NOTE — Progress Notes (Addendum)
Patient ID: Kimberly May, female   DOB: 1991-02-27, 29 y.o.   MRN: [id number]    [facility] PREGNANCY VISIT Patient name: Kimberly Utter MRN [id number]  Date of birth: Jun 24, 1990 Chief Complaint:   High Risk Gestation (NST; outpt foley placement)  History of Present Illness:   Kimberly Morello is a 29 y.o. G54P1011 female at [redacted]w[redacted]d with an Estimated Date of Delivery: 05/30/19 being seen today for ongoing management of a high-risk pregnancy complicated by A2DM, currently on Metformin 1000 mg at night. She is here for NST, reactive , and for foley bulb placement in preparationfor IOL tonight at midnight.  Today she reports no complaints. She plans on resting for the rest of the day.  Contractions: Irregular. Vag. Bleeding: None.  Movement: Present. denies leaking of fluid.  Review of Systems:   Pertinent items are noted in HPI Denies abnormal vaginal discharge w/ itching/odor/irritation, headaches, visual changes, shortness of breath, chest pain, abdominal pain, severe nausea/vomiting, or problems with urination or bowel movements unless otherwise stated above. Pertinent History Reviewed:  Reviewed past medical,surgical, social, obstetrical and family history.  Reviewed problem list, medications and allergies. Physical Assessment:   Vitals:   05/24/19 1020  BP: 114/81  Pulse: (!) 105  Weight: 239 lb (108.4 kg)  Body mass index is 46.68 kg/m.           Physical Examination:   General appearance: alert, well appearing, and in no distress and oriented to person, place, and time  Mental status: alert, oriented to person, place, and time, normal mood, behavior, speech, dress, motor activity, and thought processes, affect appropriate to mood  Skin: warm & dry   Extremities: Edema: None    Cardiovascular: normal heart rate noted  Respiratory: normal respiratory effort, no distress  Abdomen: gravid, soft, non-tender  Pelvic: visual Cervical exam performed Cervix 1 cm, long midposition -2         Cervical foley bulb inserted and inflated w/ 59ml LR w/o difficulty  Fetal Status:     Movement: Present    Fetal Surveillance Testing today: NST reactive.  Results for orders placed or performed in visit on 05/24/19 (from the past 24 hour(s))  POC Urinalysis Dipstick OB   Collection Time: 05/24/19 10:21 AM  Result Value Ref Range   Color, UA     Clarity, UA     Glucose, UA Negative Negative   Bilirubin, UA     Ketones, UA neg    Spec Grav, UA     Blood, UA neg    pH, UA     POC,PROTEIN,UA Trace Negative, Trace, Small (1+), Moderate (2+), Large (3+), 4+   Urobilinogen, UA     Nitrite, UA neg    Leukocytes, UA Moderate (2+) (A) Negative   Appearance     Odor      Assessment & Plan:  1) High-risk pregnancy G3P1011 at [redacted]w[redacted]d with an Estimated Date of Delivery: 05/30/19   2) A2DM, stable with Metformin 1000 mg at night  3) Desires TOLAC, papers have been signed  4) Cervical foley bulb inserted and inflated w/ 23ml saline w/o difficulty  Meds: No orders of the defined types were placed in this encounter.  Labs/procedures today: Foley bulb placement  Treatment Plan: Pre-admit for IOL tomorrow 05/24/2018 MIDNIGHT  Reviewed: Term labor symptoms and general obstetric precautions including but not limited to vaginal bleeding, contractions, leaking of fluid and fetal movement were reviewed in detail with the patient.  All questions were answered. Check bp weekly, let  us know if >140/90.   Follow-up: No follow-ups on file.  Orders Placed This Encounter  Procedures  . POC Urinalysis Dipstick OB   By signing my name below, I, De Burrs, attest that this documentation has been prepared under the direction and in the presence of Jonnie Kind, MD. Electronically Signed: De Burrs, Medical Scribe. 05/24/19. 11:14 AM.  I personally performed the services described in this documentation, which was SCRIBED in my presence. The recorded information has been reviewed and  considered accurate. It has been edited as necessary during review. Jonnie Kind, MD

## 2019-05-25 ENCOUNTER — Inpatient Hospital Stay (HOSPITAL_COMMUNITY): Payer: Medicaid Other | Admitting: Anesthesiology

## 2019-05-25 ENCOUNTER — Inpatient Hospital Stay (HOSPITAL_COMMUNITY): Payer: Medicaid Other

## 2019-05-25 ENCOUNTER — Other Ambulatory Visit: Payer: Medicaid Other

## 2019-05-25 ENCOUNTER — Inpatient Hospital Stay (HOSPITAL_COMMUNITY)
Admission: AD | Admit: 2019-05-25 | Discharge: 2019-05-28 | DRG: 788 | Disposition: A | Discharge status: Home / Self Care | Payer: Medicaid Other | Attending: Obstetrics and Gynecology | Admitting: Obstetrics and Gynecology

## 2019-05-25 ENCOUNTER — Encounter (HOSPITAL_COMMUNITY): Payer: Self-pay | Admitting: Obstetrics and Gynecology

## 2019-05-25 ENCOUNTER — Encounter: Payer: Medicaid Other | Admitting: Obstetrics and Gynecology

## 2019-05-25 DIAGNOSIS — O34211 Maternal care for low transverse scar from previous cesarean delivery: Secondary | ICD-10-CM | POA: Diagnosis not present

## 2019-05-25 DIAGNOSIS — A749 Chlamydial infection, unspecified: Secondary | ICD-10-CM | POA: Diagnosis present

## 2019-05-25 DIAGNOSIS — Z880 Allergy status to penicillin: Secondary | ICD-10-CM

## 2019-05-25 DIAGNOSIS — O24419 Gestational diabetes mellitus in pregnancy, unspecified control: Secondary | ICD-10-CM | POA: Diagnosis present

## 2019-05-25 DIAGNOSIS — Z30430 Encounter for insertion of intrauterine contraceptive device: Secondary | ICD-10-CM

## 2019-05-25 DIAGNOSIS — O99214 Obesity complicating childbirth: Secondary | ICD-10-CM | POA: Diagnosis present

## 2019-05-25 DIAGNOSIS — Z3A39 39 weeks gestation of pregnancy: Secondary | ICD-10-CM | POA: Diagnosis not present

## 2019-05-25 DIAGNOSIS — O0990 Supervision of high risk pregnancy, unspecified, unspecified trimester: Secondary | ICD-10-CM

## 2019-05-25 DIAGNOSIS — O24425 Gestational diabetes mellitus in childbirth, controlled by oral hypoglycemic drugs: Secondary | ICD-10-CM | POA: Diagnosis not present

## 2019-05-25 DIAGNOSIS — Z98891 History of uterine scar from previous surgery: Secondary | ICD-10-CM

## 2019-05-25 DIAGNOSIS — O24429 Gestational diabetes mellitus in childbirth, unspecified control: Secondary | ICD-10-CM | POA: Diagnosis not present

## 2019-05-25 DIAGNOSIS — Z3A37 37 weeks gestation of pregnancy: Secondary | ICD-10-CM | POA: Diagnosis not present

## 2019-05-25 DIAGNOSIS — O24424 Gestational diabetes mellitus in childbirth, insulin controlled: Secondary | ICD-10-CM | POA: Diagnosis not present

## 2019-05-25 HISTORY — DX: Gestational diabetes mellitus in pregnancy, unspecified control: O24.419

## 2019-05-25 LAB — URINALYSIS, ROUTINE W REFLEX MICROSCOPIC
Bilirubin Urine: NEGATIVE
Glucose, UA: NEGATIVE mg/dL
Ketones, ur: NEGATIVE mg/dL
Nitrite: NEGATIVE
Protein, ur: NEGATIVE mg/dL
Specific Gravity, Urine: 1.024 (ref 1.005–1.030)
pH: 5 (ref 5.0–8.0)

## 2019-05-25 LAB — CBC
HCT: 35.4 % — ABNORMAL LOW (ref 36.0–46.0)
Hemoglobin: 11.7 g/dL — ABNORMAL LOW (ref 12.0–15.0)
MCH: 27 pg (ref 26.0–34.0)
MCHC: 33.1 g/dL (ref 30.0–36.0)
MCV: 81.6 fL (ref 80.0–100.0)
Platelets: 288 10*3/uL (ref 150–400)
RBC: 4.34 MIL/uL (ref 3.87–5.11)
RDW: 14.6 % (ref 11.5–15.5)
WBC: 13.2 10*3/uL — ABNORMAL HIGH (ref 4.0–10.5)
nRBC: 0 % (ref 0.0–0.2)

## 2019-05-25 LAB — GLUCOSE, CAPILLARY
Glucose-Capillary: 107 mg/dL — ABNORMAL HIGH (ref 70–99)
Glucose-Capillary: 109 mg/dL — ABNORMAL HIGH (ref 70–99)
Glucose-Capillary: 117 mg/dL — ABNORMAL HIGH (ref 70–99)
Glucose-Capillary: 97 mg/dL (ref 70–99)
Glucose-Capillary: 71 mg/dL (ref 70–99)
Glucose-Capillary: 74 mg/dL (ref 70–99)

## 2019-05-25 LAB — TYPE AND SCREEN
ABO/RH(D): A POS
Antibody Screen: NEGATIVE

## 2019-05-25 LAB — RPR
RPR Ser Ql: REACTIVE — AB
RPR Titer: 1:1 {titer}

## 2019-05-25 MED ORDER — FENTANYL CITRATE (PF) 100 MCG/2ML IJ SOLN
INTRAMUSCULAR | Status: AC
  Administered 2019-05-25: 02:00:00 100 ug via INTRAVENOUS
  Filled 2019-05-25: qty 2

## 2019-05-25 MED ORDER — ONDANSETRON HCL 4 MG/2ML IJ SOLN
4.0000 mg | Freq: Four times a day (QID) | INTRAMUSCULAR | Status: DC | PRN
  Administered 2019-05-25: 09:00:00 4 mg via INTRAVENOUS
  Filled 2019-05-25: qty 2

## 2019-05-25 MED ORDER — LACTATED RINGERS IV SOLN: INTRAVENOUS | Status: DC

## 2019-05-25 MED ORDER — OXYTOCIN BOLUS FROM INFUSION: 500.0000 mL | Freq: Once | INTRAVENOUS | Status: DC

## 2019-05-25 MED ORDER — DIPHENHYDRAMINE HCL 50 MG/ML IJ SOLN
12.5000 mg | INTRAMUSCULAR | Status: AC | PRN
  Administered 2019-05-25 – 2019-05-26 (×3): 12.5 mg via INTRAVENOUS
  Filled 2019-05-25 (×2): qty 1

## 2019-05-25 MED ORDER — FENTANYL-BUPIVACAINE-NACL 0.5-0.125-0.9 MG/250ML-% EP SOLN
12.0000 mL/h | EPIDURAL | Status: DC | PRN
  Administered 2019-05-25: 23:00:00 12 mL/h via EPIDURAL
  Filled 2019-05-25 (×2): qty 250

## 2019-05-25 MED ORDER — SODIUM BICARBONATE 8.4 % IV SOLN
INTRAVENOUS | Status: DC | PRN
  Administered 2019-05-25: 5 mL via EPIDURAL

## 2019-05-25 MED ORDER — EPHEDRINE 5 MG/ML INJ
10.0000 mg | INTRAVENOUS | Status: DC | PRN
  Filled 2019-05-25: qty 10

## 2019-05-25 MED ORDER — OXYCODONE-ACETAMINOPHEN 5-325 MG PO TABS: 1.0000 | ORAL_TABLET | ORAL | Status: DC | PRN

## 2019-05-25 MED ORDER — OXYTOCIN 40 UNITS IN NORMAL SALINE INFUSION - SIMPLE MED
1.0000 m[IU]/min | INTRAVENOUS | Status: DC
  Administered 2019-05-25 – 2019-05-26 (×2): 2 m[IU]/min via INTRAVENOUS
  Filled 2019-05-25 (×2): qty 1000

## 2019-05-25 MED ORDER — FLEET ENEMA 7-19 GM/118ML RE ENEM: 1.0000 | ENEMA | RECTAL | Status: DC | PRN

## 2019-05-25 MED ORDER — EPHEDRINE 5 MG/ML INJ: 10.0000 mg | INTRAVENOUS | Status: DC | PRN

## 2019-05-25 MED ORDER — FENTANYL CITRATE (PF) 100 MCG/2ML IJ SOLN
100.0000 ug | INTRAMUSCULAR | Status: DC | PRN
  Administered 2019-05-25 (×2): 100 ug via INTRAVENOUS
  Filled 2019-05-25 (×2): qty 2

## 2019-05-25 MED ORDER — OXYTOCIN 40 UNITS IN NORMAL SALINE INFUSION - SIMPLE MED: 2.5000 [IU]/h | INTRAVENOUS | Status: DC

## 2019-05-25 MED ORDER — SOD CITRATE-CITRIC ACID 500-334 MG/5ML PO SOLN
30.0000 mL | ORAL | Status: DC | PRN
  Filled 2019-05-25: qty 30

## 2019-05-25 MED ORDER — ACETAMINOPHEN 325 MG PO TABS
650.0000 mg | ORAL_TABLET | ORAL | Status: DC | PRN
  Administered 2019-05-25: 650 mg via ORAL
  Filled 2019-05-25: qty 2

## 2019-05-25 MED ORDER — PHENYLEPHRINE 40 MCG/ML (10ML) SYRINGE FOR IV PUSH (FOR BLOOD PRESSURE SUPPORT): 80.0000 ug | PREFILLED_SYRINGE | INTRAVENOUS | Status: DC | PRN

## 2019-05-25 MED ORDER — TERBUTALINE SULFATE 1 MG/ML IJ SOLN: 0.2500 mg | Freq: Once | INTRAMUSCULAR | Status: DC | PRN

## 2019-05-25 MED ORDER — PHENYLEPHRINE 40 MCG/ML (10ML) SYRINGE FOR IV PUSH (FOR BLOOD PRESSURE SUPPORT)
PREFILLED_SYRINGE | INTRAVENOUS | Status: AC
  Administered 2019-05-25: 08:00:00 80 ug via INTRAVENOUS
  Filled 2019-05-25: qty 10

## 2019-05-25 MED ORDER — OXYCODONE-ACETAMINOPHEN 5-325 MG PO TABS: 2.0000 | ORAL_TABLET | ORAL | Status: DC | PRN

## 2019-05-25 MED ORDER — LIDOCAINE HCL (PF) 1 % IJ SOLN: 30.0000 mL | INTRAMUSCULAR | Status: DC | PRN

## 2019-05-25 MED ORDER — LACTATED RINGERS IV SOLN
500.0000 mL | INTRAVENOUS | Status: DC | PRN
  Administered 2019-05-25 (×2): 500 mL via INTRAVENOUS

## 2019-05-25 MED ORDER — SODIUM CHLORIDE (PF) 0.9 % IJ SOLN
INTRAMUSCULAR | Status: DC | PRN
  Administered 2019-05-25: 12 mL/h via EPIDURAL

## 2019-05-25 MED ORDER — LACTATED RINGERS IV SOLN
500.0000 mL | Freq: Once | INTRAVENOUS | Status: AC
  Administered 2019-05-25: 08:00:00 500 mL via INTRAVENOUS

## 2019-05-25 MED ORDER — PHENYLEPHRINE 40 MCG/ML (10ML) SYRINGE FOR IV PUSH (FOR BLOOD PRESSURE SUPPORT)
80.0000 ug | PREFILLED_SYRINGE | INTRAVENOUS | Status: DC | PRN
  Administered 2019-05-25: 08:00:00 80 ug via INTRAVENOUS

## 2019-05-25 NOTE — Progress Notes (Signed)
Vitals:   05/25/19 1830 05/25/19 1900  BP: (!) 100/54 (!) 93/56  Pulse: (!) 102 (!) 102  Resp: 16 16  Temp:    SpO2:     AROM at 1435, clear fluid.  cx now 6/50/-3.  IUPC placed. MVU's 180.  Pitocin at 20 mu/min.   Continue present mgt.

## 2019-05-25 NOTE — Progress Notes (Signed)
Vitals:   05/25/19 2102 05/25/19 2130  BP: (!) 104/49 (!) 96/56  Pulse: 92 82  Resp: 17   Temp:    SpO2:     Comfortable w/epidural except some mild pressure on left side of back. FHR Catg 1. MVUs 200. Cx 7/60 per RN.  Moving in the right direction.

## 2019-05-25 NOTE — Progress Notes (Signed)
Kimberly May is a 29 y.o. G3P1011 at [redacted]w[redacted]d.  Subjective: Comfortable w/ epidural. Had cranberry juice.   Objective: BP 112/71   Pulse 99   Temp 98.1 F (36.7 C)   Resp 18   Ht 5' (1.524 m)   Wt 110.2 kg   LMP 08/23/2018   SpO2 100%   BMI 47.46 kg/m    FHT:  FHR: 130 bpm, variability: mod,  accelerations:  15x15,  decelerations:  none UC:   Q 2-4 minutes, moderate Dilation: 5 Effacement (%): 50 Cervical Position: Posterior Station: -2 Presentation: Vertex Exam by:: Ivonne Andrew CNM  Not well applied enough for AROM   Labs: Results for orders placed or performed during the hospital encounter of 05/25/19 (from the past 24 hour(s))  CBC     Status: Abnormal   Collection Time: 05/25/19 12:04 AM  Result Value Ref Range   WBC 13.2 (H) 4.0 - 10.5 K/uL   RBC 4.34 3.87 - 5.11 MIL/uL   Hemoglobin 11.7 (L) 12.0 - 15.0 g/dL   HCT 64.4 (L) 03.4 - 74.2 %   MCV 81.6 80.0 - 100.0 fL   MCH 27.0 26.0 - 34.0 pg   MCHC 33.1 30.0 - 36.0 g/dL   RDW 59.5 63.8 - 75.6 %   Platelets 288 150 - 400 K/uL   nRBC 0.0 0.0 - 0.2 %  Type and screen     Status: None   Collection Time: 05/25/19 12:37 AM  Result Value Ref Range   ABO/RH(D) A POS    Antibody Screen NEG    Sample Expiration      05/28/2019,2359 Performed at [facility], [address] [place], [place] [address]   Glucose, capillary     Status: Abnormal   Collection Time: 05/25/19  1:22 AM  Result Value Ref Range   Glucose-Capillary 107 (H) 70 - 99 mg/dL  Urinalysis, Routine w reflex microscopic     Status: Abnormal   Collection Time: 05/25/19  1:37 AM  Result Value Ref Range   Color, Urine YELLOW YELLOW   APPearance CLOUDY (A) CLEAR   Specific Gravity, Urine 1.024 1.005 - 1.030   pH 5.0 5.0 - 8.0   Glucose, UA NEGATIVE NEGATIVE mg/dL   Hgb urine dipstick MODERATE (A) NEGATIVE   Bilirubin Urine NEGATIVE NEGATIVE   Ketones, ur NEGATIVE NEGATIVE mg/dL   Protein, ur NEGATIVE NEGATIVE mg/dL   Nitrite NEGATIVE NEGATIVE   Leukocytes,Ua LARGE (A) NEGATIVE   RBC / HPF 21-50 0 - 5 RBC/hpf   WBC, UA 21-50 0 - 5 WBC/hpf   Bacteria, UA RARE (A) NONE SEEN   Squamous Epithelial / LPF 6-10 0 - 5   Mucus PRESENT    Hyaline Casts, UA PRESENT    Ca Oxalate Crys, UA PRESENT   Glucose, capillary     Status: Abnormal   Collection Time: 05/25/19  5:46 AM  Result Value Ref Range   Glucose-Capillary 109 (H) 70 - 99 mg/dL  Glucose, capillary     Status: Abnormal   Collection Time: 05/25/19  9:43 AM  Result Value Ref Range   Glucose-Capillary 117 (H) 70 - 99 mg/dL    Assessment / Plan: [redacted]w[redacted]d week IUP Labor: Early/IOL Fetal Wellbeing:  Category I Pain Control:  Epidural Anticipated MOD:  SVD Continue Pitocin. AROM when able DM-CBGs stable   Katrinka Blazing, [place], [place] 05/25/2019 10:46 AM

## 2019-05-25 NOTE — Anesthesia Preprocedure Evaluation (Addendum)
Anesthesia Evaluation  Patient identified by MRN, date of birth, ID band Patient awake    Reviewed: Allergy & Precautions, NPO status , Patient's Chart, lab work & pertinent test results  History of Anesthesia Complications Negative for: history of anesthetic complications  Airway Mallampati: II       Dental no notable dental hx.    Pulmonary neg pulmonary ROS,    Pulmonary exam normal        Cardiovascular Normal cardiovascular exam     Neuro/Psych negative neurological ROS     GI/Hepatic   Endo/Other  diabetes, Type 2, Oral Hypoglycemic AgentsMorbid obesity (BMI 47)  Renal/GU Renal disease     Musculoskeletal   Abdominal Normal abdominal exam  (+)   Peds  Hematology   Anesthesia Other Findings   Reproductive/Obstetrics (+) Pregnancy (TOLAC)                           Anesthesia Physical Anesthesia Plan  ASA: III and emergent  Anesthesia Plan: Epidural and Spinal   Post-op Pain Management:    Induction:   PONV Risk Score and Plan: 3 and Treatment may vary due to age or medical condition, Ondansetron and Dexamethasone  Airway Management Planned: Natural Airway  Additional Equipment:   Intra-op Plan:   Post-operative Plan:   Informed Consent: I have reviewed the patients History and Physical, chart, labs and discussed the procedure including the risks, benefits and alternatives for the proposed anesthesia with the patient or authorized representative who has indicated his/her understanding and acceptance.       Plan Discussed with: CRNA  Anesthesia Plan Comments: (C/S called for failure to progress. Pt with pain on left, no clear sensory level to cold, will pull epidural and place spinal for C/S. Stephannie Peters, MD )      Anesthesia Quick Evaluation

## 2019-05-25 NOTE — Anesthesia Procedure Notes (Signed)
Epidural Patient location during procedure: OB Start time: 05/25/2019 7:38 AM End time: 05/25/2019 7:45 AM  Staffing Anesthesiologist: Shelton Silvas, MD Performed: anesthesiologist   Preanesthetic Checklist Completed: patient identified, IV checked, site marked, risks and benefits discussed, surgical consent, monitors and equipment checked, pre-op evaluation and timeout performed  Epidural Patient position: sitting Prep: ChloraPrep Patient monitoring: heart rate, continuous pulse ox and blood pressure Approach: midline Location: L3-L4 Injection technique: LOR saline  Needle:  Needle type: Tuohy  Needle gauge: 17 G Needle length: 9 cm Catheter type: closed end flexible Catheter size: 20 Guage Test dose: negative and 1.5% lidocaine  Assessment Events: blood not aspirated, injection not painful, no injection resistance and no paresthesia  Additional Notes LOR @ 8  Patient identified. Risks/Benefits/Options discussed with patient including but not limited to bleeding, infection, nerve damage, paralysis, failed block, incomplete pain control, headache, blood pressure changes, nausea, vomiting, reactions to medications, itching and postpartum back pain. Confirmed with bedside nurse the patient's most recent platelet count. Confirmed with patient that they are not currently taking any anticoagulation, have any bleeding history or any family history of bleeding disorders. Patient expressed understanding and wished to proceed. All questions were answered. Sterile technique was used throughout the entire procedure. Please see nursing notes for vital signs. Test dose was given through epidural catheter and negative prior to continuing to dose epidural or start infusion. Warning signs of high block given to the patient including shortness of breath, tingling/numbness in hands, complete motor block, or any concerning symptoms with instructions to call for help. Patient was given instructions on  fall risk and not to get out of bed. All questions and concerns addressed with instructions to call with any issues or inadequate analgesia.    Reason for block:procedure for pain

## 2019-05-25 NOTE — Progress Notes (Signed)
[facility]: Labor Progress Note  Subjective: Patient resting comfortably. Reports back pain. Reports she did receive a dose of IV pain medication which is helping pain.   Objective: BP (!) 100/56   Pulse 99   Temp 98.7 F (37.1 C) (Oral)   Resp 18   Ht 5' (1.524 m)   Wt 110.2 kg   LMP 08/23/2018   BMI 47.46 kg/m   Fetal Well-Being: - FHR 125, moderate variability, +accels, no decels  Toco: - contractions q 1.5-4 minutes  Dilation: 4 Effacement (%): 50 Cervical Position: Posterior Station: -2 Presentation: Vertex Exam by:: Wilfred Lacy RN  Assessment and Plan: 29 y.o. [id number] [redacted]w[redacted]d admitted for induction of labor for A2GDM.  Labor:  -- S/p foley bulb -- Pitocin -- GBS neg -- Pain control: IV pain medication; planning epidural  GDM: -- q4h CBG   Camelia Eng, SNM  4:51 AM

## 2019-05-25 NOTE — H&P (Addendum)
OBSTETRIC ADMISSION HISTORY AND PHYSICAL  Kimberly May is a 29 y.o. female G3P1011 with IUP at 32w2dby LMP consistent with 8 week ultrasound presenting for induction of labor for A2GDM. Patient had a foley balloon placed in the office earlier today, which fell out prior to SNM entering room. Patient is reporting mild to moderate contractions every few minutes that she rates 5/10. She is requesting IV pain medication at this time. She denies LOF or vaginal bleeding. Reports good fetal movement.   [facility]  Support person in labor: Kimberly  Ultrasounds . Anatomy U/S: normal female  Prenatal History/Complications: . A2GDM . Previous C-Section  Past Medical History: Past Medical History:  Diagnosis Date  . Chlamydia 08/03/2016   Treated 3/26 pt and partner DDuwaine Maxin 07/10/97.POC 4/23 at 4 pm, no sex and [place] Grovesent  . Tuberculosis 15 YRS AGO    FATHER HAD TB , PT WAS MED [place]    Past Surgical History: Past Surgical History:  Procedure Laterality Date  . ADENOIDECTOMY    . CESAREAN SECTION  2009   . DIRECT LARYNGOSCOPY N/A 06/14/2013   Procedure: DIRECT LARYNGOSCOPY/RE-EXPLORATORY OF NECK;  Surgeon: KJodi Marble MD;  [facility]  Service: ENT;  Laterality: N/A;  . INCISION AND DRAINAGE ABSCESS N/A 06/04/2013   Procedure: INCISION AND DRAINAGE PHARYNGEAL ABSCESS;  Surgeon: MRuby Cola MD;  [facility]  Service: ENT;  Laterality: N/A;  . INCISION AND DRAINAGE OF PERITONSILLAR ABCESS Left 06/09/2013   Procedure: INCISION AND DRAINAGE OF left retropharyneal abscess;  Surgeon: KJodi Marble MD;  [facility] [facility]  Service: ENT;  Laterality: Left;  . TONSILLECTOMY  04/10/2011   Procedure: TONSILLECTOMY;  Surgeon: DOnnie Graham  [facility];  Service: ENT;  Laterality: Bilateral;  . TONSILLECTOMY    . TRACHEOSTOMY TUBE PLACEMENT N/A 06/14/2013   Procedure: TRACHEOSTOMY;  Surgeon: KJodi Marble MD;  [place] [facility]  Service: ENT;   Laterality: N/A;  . TYMPANOSTOMY TUBE PLACEMENT  DONE TWICE    [facility]    ([facility])    Obstetrical History: OB History    Gravida  3   Para  1   Term  1   Preterm      AB  1   Living  1     SAB  1   TAB      Ectopic      Multiple      Live Births  1           Social History: Social History   Socioeconomic History  . Marital status: Single    Spouse name: Kimberly May  . Number of children: 1  . Years of education: 155 . Highest education level: Some college, no degree  Occupational History  . Occupation: [employer]  Tobacco Use  . Smoking status: Never Smoker  . Smokeless tobacco: Never Used  Substance and Sexual Activity  . Alcohol use: No  . Drug use: No  . Sexual activity: Yes    Birth control/protection: None  Other Topics Concern  . Not on file  Social History Narrative  . Not on file   Social Determinants of Health   Financial Resource Strain: Low Risk   . Difficulty of Paying Living Expenses: Not hard at all  Food Insecurity: No Food Insecurity  . Worried About RCharity fundraiserin the Last Year: Never true  . Ran Out of Food in the Last Year: Never  true  Transportation Needs: No Transportation Needs  . Lack of Transportation (Medical): No  . Lack of Transportation (Non-Medical): No  Physical Activity: Insufficiently Active  . Days of Exercise per Week: 5 days  . Minutes of Exercise per Session: 20 min  Stress: No Stress Concern Present  . Feeling of Stress : Not at all  Social Connections: Somewhat Isolated  . Frequency of Communication with Friends and Family: More than three times a week  . Frequency of Social Gatherings with Friends and Family: More than three times a week  . Attends Religious Services: More than 4 times per year  . Active Member of Clubs or Organizations: No  . Attends Archivist Meetings: Never  . Marital Status: Never married    Family History: Family History  Problem Relation Age of Onset   . Cancer Father        larynx  . Pneumonia Father   . Other Paternal Grandfather        colon tumor  . Thyroid disease Paternal Grandmother   . Cancer Maternal Grandmother        hodgkins lymphoma, cervical  . Heart disease Maternal Grandmother   . Asthma Son   . ADD / ADHD Son     Allergies: Allergies  Allergen Reactions  . Latex Hives  . Penicillins Rash    Has patient had a PCN reaction causing immediate rash, facial/tongue/throat swelling, SOB or lightheadedness with hypotension: No Has patient had a PCN reaction causing severe rash involving mucus membranes or skin necrosis: Yes Has patient had a PCN reaction that required hospitalization No Has patient had a PCN reaction occurring within the last 10 years: Yes If all of the above answers are "NO", then may proceed with Cephalosporin use.    Medications Prior to Admission  Medication Sig Dispense Refill Last Dose  . Blood Glucose Monitoring Suppl (ACCU-CHEK GUIDE ME) w/Device KIT 1 each by Does not apply route 4 (four) times daily. 1 kit 0   . glucose blood (ACCU-CHEK GUIDE) test strip Use as instructed to check blood sugar 4 times daily 100 each 12   . Lancets (ACCU-CHEK SOFT TOUCH) lancets Use as instructed to check blood sugar 4 times daily. 100 each prn   . metFORMIN (GLUCOPHAGE) 500 MG tablet Take 2 tablets (1,000 mg total) by mouth at bedtime. 30 tablet 3   . Prenatal MV & Min w/FA-DHA (PRENATAL ADULT GUMMY/DHA/FA PO) Take by mouth. Takes 2 daily      Review of Systems  All systems reviewed and negative except as stated in HPI  Physical Exam  Last menstrual period 08/23/2018. General appearance: alert, cooperative and mild distress Lungs: no respiratory distress Heart: regular rate  Abdomen: soft, non-tender; gravid Extremities: Homans sign is negative, no sign of DVT Presentation: cephalic  Fetal monitoring:  -- FHR 140, moderate variability, +accels, no decels  Uterine activity:  -- q2  minutes  Prenatal labs: ABO, Rh: A/Positive/-- (07/16 1647) Antibody: Negative (10/28 0914) Rubella: 1.66 (07/16 1647) RPR: Non Reactive (10/28 0914)  HBsAg: Negative (07/16 1647)  HIV: Non Reactive (10/28 0914)  GBS: --Henderson Cloud (12/31 1400)  Glucola: 131/192/115 Genetic screening:  Neg NT/IT    F[facility]ILY TREE  LAB RESULTS  Language English Pap 07/30/16 -  Initiated care at 13wk GC/CT Initial: 22wk -/-     36wks:  Dating by LMP c/w 8wk U/S    Support person  Genetics NT/IT:  neg   AFP:  MaterniT21:    /HgbE   Flu vaccine 03/08/19 CF declined  TDaP vaccine 03/08/19 SMA declined  Rhogam n/a Fragile X declined       Anatomy US Normal female '[place]yleigh' Blood Type A/Positive/-- (07/16 1647)  Feeding Plan bottle Antibody Negative (07/16 1647)  Contraception IUD HBsAg Negative (07/16 1647)  Circumcision n/a RPR Non Reactive (07/16 1647)  Pediatrician Lukings Rubella  1.66 (07/16 1647)  Prenatal Classes Online discussed HIV Non Reactive (07/16 1647)    GTT/A1C Early:      26-28wks: 131/192/115  BTL Consent n/a GBS     neg   [ ]  PCN allergy  VBAC Consent 03/08/19    Waterbirth [ ] Class [ ] Consent [ ] CNM visit PP Needs       Prenatal Transfer Tool  Maternal Diabetes: Yes:  Diabetes Type:  Insulin/Medication controlled Genetic Screening: Normal Maternal Ultrasounds/Referrals: Normal Fetal Ultrasounds or other Referrals:  None Maternal Substance Abuse:  No Significant Maternal Medications:  Meds include: Other: Metformin Significant Maternal Lab Results: Group B Strep negative  Results for orders placed or performed in visit on 05/24/19 (from the past 24 hour(s))  POC Urinalysis Dipstick OB   Collection Time: 05/24/19 10:21 AM  Result Value Ref Range   Color, UA     Clarity, UA     Glucose, UA Negative Negative   Bilirubin, UA     Ketones, UA neg    Spec Grav, UA     Blood, UA neg    pH, UA     POC,PROTEIN,UA Trace Negative, Trace, Small (1+), Moderate (2+), Large (3+),  4+   Urobilinogen, UA     Nitrite, UA neg    Leukocytes, UA Moderate (2+) (A) Negative   Appearance     Odor      Patient Active Problem List   Diagnosis Date Noted  . Gestational diabetes mellitus (GDM) affecting second pregnancy 05/25/2019  . Gestational diabetes mellitus, class A2 03/09/2019  . Lack of physical activity 03/08/2019  . Supervision of high risk pregnancy, antepartum 11/24/2018  . History of cesarean section 09/29/2018  . Chlamydia infection affecting pregnancy in third trimester, antepartum 08/03/2016  . Hypoxemia 06/23/2013  . Tracheostomy status ([place]) 06/23/2013  . Hypokalemia 06/18/2013  . Acute respiratory failure ([place]) 06/10/2013  . Septic shock ([place]) 06/10/2013  . ARF (acute renal failure) ([place]) 06/10/2013    Assessment/Plan:  Kimberly Merlo is a 29 y.o. G3P1011 at 36w2dhere for induction of labor for A2GDM.  Labor:  -- Foley bulb out -- Pitocin titration  -- Q4 CBG -- GBS negative -- Pain control: IV pain medication, planning epidural later   Postpartum Planning -- Formula -- Contraception: IUD outpatient   DMaryagnes Amos SNM 12:07 AM  CNM attestation:  I have seen and examined this patient; I agree with above documentation in the midwife student's note.   JMartiniquePruitt is a 29y.o. G3P1011 here for IOL for GDM A2/B; had cervical foley placed in office 05/24/19. [facility]  PE: BP (!) 104/53   Pulse 92   Temp 98.7 F (37.1 C) (Oral)   Resp 16   Ht 5' (1.524 m)   Wt 110.2 kg   LMP 08/23/2018   SpO2 99%   BMI 47.46 kg/m  Gen: calm comfortable, NAD Resp: normal effort, no distress Abd: gravid  ROS, labs, PMH reviewed  Plan: Admit to Labor and Delivery Desires TOLAC- consent in chart Plan Pitocin as IOL method Will start glucostabilizer prn Anticipate vag  del  Myrtis Ser CNM 05/25/2019, 8:14 AM

## 2019-05-25 NOTE — Progress Notes (Signed)
OB/GYN Faculty Practice: Labor Progress Note  Subjective: Pt reports more painful contractions. Has received several doses of IV pain medication. Requesting epidural.  Objective: BP 107/61   Pulse 84   Temp 98.7 F (37.1 C) (Oral)   Resp 18   Ht 5' (1.524 m)   Wt 110.2 kg   LMP 08/23/2018   BMI 47.46 kg/m   Fetal Well-Being: - FHR 135, moderate variability, +accels, no decels  Toco: - contractions 1.5-3  Dilation: 4.5 Effacement (%): 50 Cervical Position: Posterior Station: -2 Presentation: Vertex Exam by:: D Tarri Guilfoil  Assessment and Plan: 29 y.o. [id number] [redacted]w[redacted]d admitted for induction of labor for A2GDM.  Labor:  -- S/p foley bulb, currently on Pitocin -- GBS neg -- Pain control: IV pain medication; requesting epidural  A2GDM: -- Stable -- q4h CBG  Camelia Eng, SNM 7:30 AM

## 2019-05-26 ENCOUNTER — Encounter (HOSPITAL_COMMUNITY)
Admission: AD | Disposition: A | Discharge status: Home / Self Care | Payer: Self-pay | Source: Home / Self Care | Attending: Obstetrics and Gynecology

## 2019-05-26 ENCOUNTER — Other Ambulatory Visit: Payer: Medicaid Other | Admitting: Obstetrics and Gynecology

## 2019-05-26 ENCOUNTER — Encounter (HOSPITAL_COMMUNITY): Payer: Self-pay | Admitting: Obstetrics and Gynecology

## 2019-05-26 DIAGNOSIS — Z3A39 39 weeks gestation of pregnancy: Secondary | ICD-10-CM

## 2019-05-26 DIAGNOSIS — Z30430 Encounter for insertion of intrauterine contraceptive device: Secondary | ICD-10-CM

## 2019-05-26 DIAGNOSIS — O24424 Gestational diabetes mellitus in childbirth, insulin controlled: Secondary | ICD-10-CM

## 2019-05-26 LAB — GLUCOSE, CAPILLARY
Glucose-Capillary: 89 mg/dL (ref 70–99)
Glucose-Capillary: 89 mg/dL (ref 70–99)
Glucose-Capillary: 88 mg/dL (ref 70–99)
Glucose-Capillary: 78 mg/dL (ref 70–99)
Glucose-Capillary: 84 mg/dL (ref 70–99)

## 2019-05-26 LAB — T.PALLIDUM AB, TOTAL: T Pallidum Abs: NONREACTIVE

## 2019-05-26 SURGERY — Surgical Case
Anesthesia: Epidural

## 2019-05-26 MED ORDER — NALOXONE HCL 0.4 MG/ML IJ SOLN: 0.4000 mg | INTRAMUSCULAR | Status: DC | PRN

## 2019-05-26 MED ORDER — OXYTOCIN 40 UNITS IN NORMAL SALINE INFUSION - SIMPLE MED
INTRAVENOUS | Status: DC | PRN
  Administered 2019-05-26: 40 mL via INTRAVENOUS

## 2019-05-26 MED ORDER — ACETAMINOPHEN 500 MG PO TABS: 1000.0000 mg | ORAL_TABLET | Freq: Once | ORAL | Status: DC

## 2019-05-26 MED ORDER — PROMETHAZINE HCL 25 MG/ML IJ SOLN: 6.2500 mg | INTRAMUSCULAR | Status: DC | PRN

## 2019-05-26 MED ORDER — PHENYLEPHRINE HCL-NACL 20-0.9 MG/250ML-% IV SOLN
INTRAVENOUS | Status: AC
  Filled 2019-05-26: qty 250

## 2019-05-26 MED ORDER — SODIUM CHLORIDE 0.9% FLUSH: 3.0000 mL | INTRAVENOUS | Status: DC | PRN

## 2019-05-26 MED ORDER — MENTHOL 3 MG MT LOZG: 1.0000 | LOZENGE | OROMUCOSAL | Status: DC | PRN

## 2019-05-26 MED ORDER — ACETAMINOPHEN 160 MG/5ML PO SOLN: 1000.0000 mg | Freq: Once | ORAL | Status: DC

## 2019-05-26 MED ORDER — SODIUM CHLORIDE 0.9 % IV SOLN
500.0000 mg | INTRAVENOUS | Status: AC
  Administered 2019-05-26: 500 mg via INTRAVENOUS

## 2019-05-26 MED ORDER — NALBUPHINE HCL 10 MG/ML IJ SOLN: 5.0000 mg | Freq: Once | INTRAMUSCULAR | Status: DC | PRN

## 2019-05-26 MED ORDER — DIPHENHYDRAMINE HCL 50 MG/ML IJ SOLN: 12.5000 mg | INTRAMUSCULAR | Status: DC | PRN

## 2019-05-26 MED ORDER — KETOROLAC TROMETHAMINE 30 MG/ML IJ SOLN
30.0000 mg | Freq: Four times a day (QID) | INTRAMUSCULAR | Status: AC | PRN
  Administered 2019-05-26: 15:00:00 30 mg via INTRAMUSCULAR

## 2019-05-26 MED ORDER — OXYTOCIN 40 UNITS IN NORMAL SALINE INFUSION - SIMPLE MED
INTRAVENOUS | Status: AC
  Filled 2019-05-26: qty 1000

## 2019-05-26 MED ORDER — COCONUT OIL OIL: 1.0000 "application " | TOPICAL_OIL | Status: DC | PRN

## 2019-05-26 MED ORDER — LACTATED RINGERS IV SOLN: INTRAVENOUS | Status: DC

## 2019-05-26 MED ORDER — SIMETHICONE 80 MG PO CHEW
80.0000 mg | CHEWABLE_TABLET | ORAL | Status: DC
  Administered 2019-05-26 – 2019-05-27 (×2): 80 mg via ORAL
  Filled 2019-05-26 (×2): qty 1

## 2019-05-26 MED ORDER — PHENYLEPHRINE 40 MCG/ML (10ML) SYRINGE FOR IV PUSH (FOR BLOOD PRESSURE SUPPORT): 80.0000 ug | PREFILLED_SYRINGE | INTRAVENOUS | Status: DC | PRN

## 2019-05-26 MED ORDER — FENTANYL CITRATE (PF) 100 MCG/2ML IJ SOLN: 25.0000 ug | INTRAMUSCULAR | Status: DC | PRN

## 2019-05-26 MED ORDER — OXYCODONE-ACETAMINOPHEN 5-325 MG PO TABS
1.0000 | ORAL_TABLET | ORAL | Status: DC | PRN
  Administered 2019-05-27 – 2019-05-28 (×6): 2 via ORAL
  Filled 2019-05-26 (×6): qty 2

## 2019-05-26 MED ORDER — DIPHENHYDRAMINE HCL 25 MG PO CAPS: 25.0000 mg | ORAL_CAPSULE | ORAL | Status: DC | PRN

## 2019-05-26 MED ORDER — FENTANYL CITRATE (PF) 100 MCG/2ML IJ SOLN
INTRAMUSCULAR | Status: DC | PRN
  Administered 2019-05-26: 15 ug via INTRATHECAL

## 2019-05-26 MED ORDER — NALBUPHINE HCL 10 MG/ML IJ SOLN: 5.0000 mg | INTRAMUSCULAR | Status: DC | PRN

## 2019-05-26 MED ORDER — ONDANSETRON HCL 4 MG/2ML IJ SOLN
INTRAMUSCULAR | Status: DC | PRN
  Administered 2019-05-26: 4 mg via INTRAVENOUS

## 2019-05-26 MED ORDER — LACTATED RINGERS IV SOLN: 500.0000 mL | Freq: Once | INTRAVENOUS | Status: DC

## 2019-05-26 MED ORDER — SODIUM CHLORIDE 0.9 % IV SOLN
INTRAVENOUS | Status: AC
  Filled 2019-05-26: qty 500

## 2019-05-26 MED ORDER — BUPIVACAINE IN DEXTROSE 0.75-8.25 % IT SOLN
INTRATHECAL | Status: DC | PRN
  Administered 2019-05-26: 1.4 mg via INTRATHECAL

## 2019-05-26 MED ORDER — KETOROLAC TROMETHAMINE 30 MG/ML IJ SOLN
INTRAMUSCULAR | Status: AC
  Filled 2019-05-26: qty 1

## 2019-05-26 MED ORDER — WITCH HAZEL-GLYCERIN EX PADS: 1.0000 "application " | MEDICATED_PAD | CUTANEOUS | Status: DC | PRN

## 2019-05-26 MED ORDER — LEVONORGESTREL 19.5 MCG/DAY IU IUD
INTRAUTERINE_SYSTEM | Freq: Once | INTRAUTERINE | Status: AC
  Administered 2019-05-26: 1 via INTRAUTERINE

## 2019-05-26 MED ORDER — SODIUM CHLORIDE 0.9 % IR SOLN
Status: DC | PRN
  Administered 2019-05-26: 1000 mL

## 2019-05-26 MED ORDER — PRENATAL MULTIVITAMIN CH
1.0000 | ORAL_TABLET | Freq: Every day | ORAL | Status: DC
  Administered 2019-05-27 – 2019-05-28 (×2): 1 via ORAL
  Filled 2019-05-26 (×2): qty 1

## 2019-05-26 MED ORDER — EPHEDRINE 5 MG/ML INJ: 10.0000 mg | INTRAVENOUS | Status: DC | PRN

## 2019-05-26 MED ORDER — FENTANYL-BUPIVACAINE-NACL 0.5-0.125-0.9 MG/250ML-% EP SOLN: 12.0000 mL/h | EPIDURAL | Status: DC | PRN

## 2019-05-26 MED ORDER — DEXAMETHASONE SODIUM PHOSPHATE 4 MG/ML IJ SOLN
INTRAMUSCULAR | Status: AC
  Filled 2019-05-26: qty 1

## 2019-05-26 MED ORDER — ACETAMINOPHEN 500 MG PO TABS
1000.0000 mg | ORAL_TABLET | Freq: Four times a day (QID) | ORAL | Status: AC
  Administered 2019-05-26 – 2019-05-27 (×3): 1000 mg via ORAL
  Filled 2019-05-26 (×3): qty 2

## 2019-05-26 MED ORDER — IBUPROFEN 800 MG PO TABS
800.0000 mg | ORAL_TABLET | Freq: Three times a day (TID) | ORAL | Status: DC
  Administered 2019-05-26 – 2019-05-28 (×5): 800 mg via ORAL
  Filled 2019-05-26 (×5): qty 1

## 2019-05-26 MED ORDER — GABAPENTIN 100 MG PO CAPS
100.0000 mg | ORAL_CAPSULE | Freq: Three times a day (TID) | ORAL | Status: DC
  Administered 2019-05-26 – 2019-05-28 (×5): 100 mg via ORAL
  Filled 2019-05-26 (×5): qty 1

## 2019-05-26 MED ORDER — ENOXAPARIN SODIUM 60 MG/0.6ML ~~LOC~~ SOLN
60.0000 mg | SUBCUTANEOUS | Status: DC
  Administered 2019-05-27 – 2019-05-28 (×2): 60 mg via SUBCUTANEOUS
  Filled 2019-05-26 (×2): qty 0.6

## 2019-05-26 MED ORDER — KETOROLAC TROMETHAMINE 30 MG/ML IJ SOLN: 30.0000 mg | Freq: Once | INTRAMUSCULAR | Status: DC

## 2019-05-26 MED ORDER — SENNOSIDES-DOCUSATE SODIUM 8.6-50 MG PO TABS
2.0000 | ORAL_TABLET | ORAL | Status: DC
  Administered 2019-05-26 – 2019-05-27 (×2): 2 via ORAL
  Filled 2019-05-26 (×2): qty 2

## 2019-05-26 MED ORDER — KETOROLAC TROMETHAMINE 30 MG/ML IJ SOLN: 30.0000 mg | Freq: Four times a day (QID) | INTRAMUSCULAR | Status: AC | PRN

## 2019-05-26 MED ORDER — SIMETHICONE 80 MG PO CHEW
80.0000 mg | CHEWABLE_TABLET | Freq: Three times a day (TID) | ORAL | Status: DC
  Administered 2019-05-26 – 2019-05-28 (×5): 80 mg via ORAL
  Filled 2019-05-26 (×5): qty 1

## 2019-05-26 MED ORDER — MORPHINE SULFATE (PF) 0.5 MG/ML IJ SOLN
INTRAMUSCULAR | Status: DC | PRN
  Administered 2019-05-26: .15 mg via INTRATHECAL

## 2019-05-26 MED ORDER — TETANUS-DIPHTH-ACELL PERTUSSIS 5-2.5-18.5 LF-MCG/0.5 IM SUSP: 0.5000 mL | Freq: Once | INTRAMUSCULAR | Status: DC

## 2019-05-26 MED ORDER — ONDANSETRON HCL 4 MG/2ML IJ SOLN
INTRAMUSCULAR | Status: AC
  Filled 2019-05-26: qty 2

## 2019-05-26 MED ORDER — DIBUCAINE (PERIANAL) 1 % EX OINT: 1.0000 "application " | TOPICAL_OINTMENT | CUTANEOUS | Status: DC | PRN

## 2019-05-26 MED ORDER — OXYTOCIN 40 UNITS IN NORMAL SALINE INFUSION - SIMPLE MED: 2.5000 [IU]/h | INTRAVENOUS | Status: AC

## 2019-05-26 MED ORDER — DIPHENHYDRAMINE HCL 25 MG PO CAPS: 25.0000 mg | ORAL_CAPSULE | Freq: Four times a day (QID) | ORAL | Status: DC | PRN

## 2019-05-26 MED ORDER — CEFAZOLIN SODIUM-DEXTROSE 2-4 GM/100ML-% IV SOLN: 2.0000 g | INTRAVENOUS | Status: DC

## 2019-05-26 MED ORDER — SOD CITRATE-CITRIC ACID 500-334 MG/5ML PO SOLN
30.0000 mL | ORAL | Status: AC
  Administered 2019-05-26: 30 mL via ORAL

## 2019-05-26 MED ORDER — ONDANSETRON HCL 4 MG/2ML IJ SOLN: 4.0000 mg | Freq: Three times a day (TID) | INTRAMUSCULAR | Status: DC | PRN

## 2019-05-26 MED ORDER — SIMETHICONE 80 MG PO CHEW: 80.0000 mg | CHEWABLE_TABLET | ORAL | Status: DC | PRN

## 2019-05-26 MED ORDER — LEVONORGESTREL 19.5 MCG/DAY IU IUD
INTRAUTERINE_SYSTEM | INTRAUTERINE | Status: AC
  Filled 2019-05-26: qty 1

## 2019-05-26 MED ORDER — FENTANYL CITRATE (PF) 100 MCG/2ML IJ SOLN
INTRAMUSCULAR | Status: AC
  Filled 2019-05-26: qty 2

## 2019-05-26 MED ORDER — NALOXONE HCL 4 MG/10ML IJ SOLN
1.0000 ug/kg/h | INTRAVENOUS | Status: DC | PRN
  Filled 2019-05-26: qty 5

## 2019-05-26 MED ORDER — MORPHINE SULFATE (PF) 0.5 MG/ML IJ SOLN
INTRAMUSCULAR | Status: AC
  Filled 2019-05-26: qty 10

## 2019-05-26 MED ORDER — SODIUM CHLORIDE 0.9 % IV SOLN: INTRAVENOUS | Status: DC | PRN

## 2019-05-26 MED ORDER — PHENYLEPHRINE HCL-NACL 20-0.9 MG/250ML-% IV SOLN
INTRAVENOUS | Status: DC | PRN
  Administered 2019-05-26: 60 ug/min via INTRAVENOUS

## 2019-05-26 MED ORDER — LACTATED RINGERS IV SOLN: INTRAVENOUS | Status: DC | PRN

## 2019-05-26 SURGICAL SUPPLY — 36 items
BRR ADH 6X5 SEPRAFILM 1 SHT (MISCELLANEOUS)
CHLORAPREP W/TINT 26ML (MISCELLANEOUS) ×3 IMPLANT
CLAMP CORD UMBIL (MISCELLANEOUS) ×2 IMPLANT
DRESSING PREVENA PLUS CUSTOM (GAUZE/BANDAGES/DRESSINGS) IMPLANT
DRSG OPSITE POSTOP 4X10 (GAUZE/BANDAGES/DRESSINGS) ×3 IMPLANT
DRSG PREVENA PLUS CUSTOM (GAUZE/BANDAGES/DRESSINGS) ×3
ELECT REM PT RETURN 9FT ADLT (ELECTROSURGICAL) ×3
ELECTRODE REM PT RTRN 9FT ADLT (ELECTROSURGICAL) ×1 IMPLANT
EXTRACTOR VACUUM M CUP 4 TUBE (SUCTIONS) IMPLANT
EXTRACTOR VACUUM M CUP 4' TUBE (SUCTIONS)
GLOVE BIOGEL PI IND STRL 6.5 (GLOVE) ×1 IMPLANT
GLOVE BIOGEL PI IND STRL 7.0 (GLOVE) ×1 IMPLANT
GLOVE BIOGEL PI INDICATOR 6.5 (GLOVE) ×2
GLOVE BIOGEL PI INDICATOR 7.0 (GLOVE) ×2
GLOVE SURG SS PI 6.0 STRL IVOR (GLOVE) ×3 IMPLANT
GOWN STRL REUS W/TWL LRG LVL3 (GOWN DISPOSABLE) ×6 IMPLANT
HOVERMATT SINGLE USE (MISCELLANEOUS) ×2 IMPLANT
KIT ABG SYR 3ML LUER SLIP (SYRINGE) IMPLANT
KIT PREVENA INCISION MGT20CM45 (CANNISTER) ×2 IMPLANT
NDL HYPO 25X5/8 SAFETYGLIDE (NEEDLE) IMPLANT
NEEDLE HYPO 25X5/8 SAFETYGLIDE (NEEDLE) IMPLANT
NS IRRIG 1000ML POUR BTL (IV SOLUTION) ×3 IMPLANT
PACK C SECTION WH (CUSTOM PROCEDURE TRAY) ×3 IMPLANT
PAD OB MATERNITY 4.3X12.25 (PERSONAL CARE ITEMS) ×3 IMPLANT
PENCIL SMOKE EVAC W/HOLSTER (ELECTROSURGICAL) ×3 IMPLANT
RETRACTOR TRAXI PANNICULUS (MISCELLANEOUS) IMPLANT
RTRCTR C-SECT PINK 25CM LRG (MISCELLANEOUS) ×2 IMPLANT
SEPRAFILM MEMBRANE 5X6 (MISCELLANEOUS) IMPLANT
SUT PLAIN 0 NONE (SUTURE) IMPLANT
SUT PLAIN 2 0 XLH (SUTURE) ×2 IMPLANT
SUT VIC AB 0 CT1 36 (SUTURE) ×12 IMPLANT
SUT VIC AB 4-0 KS 27 (SUTURE) ×3 IMPLANT
TOWEL OR 17X24 6PK STRL BLUE (TOWEL DISPOSABLE) ×3 IMPLANT
TRAXI PANNICULUS RETRACTOR (MISCELLANEOUS) ×2
TRAY FOLEY W/BAG SLVR 14FR LF (SET/KITS/TRAYS/PACK) ×3 IMPLANT
WATER STERILE IRR 1000ML POUR (IV SOLUTION) ×3 IMPLANT

## 2019-05-26 NOTE — Transfer of Care (Signed)
Immediate Anesthesia Transfer of Care Note  Patient: Kimberly May  Procedure(s) Performed: CESAREAN SECTION (N/A )  Patient Location: PACU  Anesthesia Type:Spinal  Level of Consciousness: awake, alert  and oriented  Airway & Oxygen Therapy: Patient Spontanous Breathing  Post-op Assessment: Report given to RN and Post -op Vital signs reviewed and stable  Post vital signs: Reviewed and stable  Last Vitals:  Vitals Value Taken Time  BP    Temp    Pulse 107 05/26/19 1359  Resp    SpO2 99 % 05/26/19 1359  Vitals shown include unvalidated device data.  Last Pain:  Vitals:   05/26/19 1231  TempSrc:   PainSc: 0-No pain      Patients Stated Pain Goal: 5 (05/25/19 1436)  Complications: No apparent anesthesia complications

## 2019-05-26 NOTE — Anesthesia Postprocedure Evaluation (Signed)
Anesthesia Post Note  Patient: Kimberly May  Procedure(s) Performed: CESAREAN SECTION (N/A )     Patient location during evaluation: PACU Anesthesia Type: Spinal Level of consciousness: awake and alert and oriented Pain management: pain level controlled Vital Signs Assessment: post-procedure vital signs reviewed and stable Respiratory status: spontaneous breathing, nonlabored ventilation and respiratory function stable Cardiovascular status: blood pressure returned to baseline Postop Assessment: no apparent nausea or vomiting, spinal receding, no headache and no backache Anesthetic complications: no     Kaylyn Layer

## 2019-05-26 NOTE — Discharge Summary (Signed)
Postpartum Discharge Summary    Patient Name: Kimberly May DOB: 1990/07/08 MRN: [id number]  Date of admission: 05/25/2019 Delivering Provider: CONSTANT, Vickii Chafe   Date of discharge: 05/28/2019  Admitting diagnosis: Gestational diabetes mellitus (GDM) affecting second pregnancy [O24.419] Intrauterine pregnancy: [redacted]w[redacted]d    Secondary diagnosis:  Active Problems:   Chlamydia infection affecting pregnancy in third trimester, antepartum   History of cesarean section   Supervision of high risk pregnancy, antepartum   Gestational diabetes mellitus, class A2   Gestational diabetes mellitus (GDM) affecting second pregnancy  Additional problems: n/a     Discharge diagnosis: Term Pregnancy Delivered and GDM A2                                                                                                Post partum procedures:Post-placental IUD placement  Augmentation: AROM, Pitocin and Foley Balloon  Complications: None  Hospital course:  Induction of Labor With Cesarean Section  29y.o. yo [id number] admitted to the hospital 05/25/2019 for induction of labor. Patient had a labor course significant for: arrived at 2cm and induced with foley bulb and pitocin, achieved 6cm dilation but then had arrest of dilation for 10 hours at which point no longer [facility] went for ces[facility] section due to Elective Repeat, and delivered a Viable infant,05/26/2019  Membrane Rupture Time/Date: 2:36 PM ,05/25/2019   Details of operation can be found in separate operative Note.  Patient had an uncomplicated postpartum course. She is ambulating, tolerating a regular diet, passing flatus, and urinating well.  Patient is discharged home in stable condition on 05/28/19.                                   Delivery time: 1:15 PM    Magnesium Sulfate received: No BMZ received: No Rhophylac:N/A MMR:N/A Transfusion:No  Physical exam  Vitals:   05/27/19 0534 05/27/19 1358 05/27/19 2056  05/28/19 0621  BP: (!) 110/59 127/76 (!) 129/95 123/67  Pulse: 91 85 90 89  Resp: 16 16 17 16   Temp: 98 F (36.7 C) 97.7 F (36.5 C) 98 F (36.7 C) 98.2 F (36.8 C)  TempSrc: Oral Oral Oral Oral  SpO2: 97% 99% 100% 100%  Weight:      Height:       General: alert, cooperative and no distress Lochia: appropriate Uterine Fundus: firm Incision: Healing well with no significant drainage, Dressing is clean, dry, and intact DVT Evaluation: No evidence of DVT seen on physical exam. Labs: Lab Results  Component Value Date   WBC 13.1 (H) 05/27/2019   HGB 9.2 (L) 05/27/2019   HCT 27.6 (L) 05/27/2019   MCV 83.6 05/27/2019   PLT 250 05/27/2019   CMP Latest Ref Rng & Units 05/27/2019  Glucose 70 - 99 mg/dL -  BUN 6 - 20 mg/dL -  Creatinine 0.44 - 1.00 mg/dL 0.58  Sodium 135 - 145 mmol/L -  Potassium 3.5 - 5.1 mmol/L -  Chloride 98 - 111 mmol/L -  CO2 22 -  32 mmol/L -  Calcium 8.9 - 10.3 mg/dL -  Total Protein 6.0 - 8.3 g/dL -  Total Bilirubin 0.3 - 1.2 mg/dL -  Alkaline Phos 39 - 117 U/L -  AST 0 - 37 U/L -  ALT 0 - 35 U/L -    Discharge instruction: per After Visit Summary and "Baby and Me Booklet".  After visit meds:  Allergies as of 05/28/2019      Reactions   Latex Hives   Penicillins Rash   Has patient had a PCN reaction causing immediate rash, facial/tongue/throat swelling, SOB or lightheadedness with hypotension: No Has patient had a PCN reaction causing severe rash involving mucus membranes or skin necrosis: Yes Has patient had a PCN reaction that required hospitalization No Has patient had a PCN reaction occurring within the last 10 years: Yes If all of the above answers are "NO", then may proceed with Cephalosporin use.      Medication List    STOP taking these medications   Accu-Chek Guide Me w/Device Kit   Accu-Chek Guide test strip Generic drug: glucose blood   accu-chek soft touch lancets   metFORMIN 500 MG tablet Commonly known as: Glucophage      TAKE these medications   ibuprofen 800 MG tablet Commonly known as: ADVIL Take 1 tablet (800 mg total) by mouth every 8 (eight) hours.   oxyCODONE-acetaminophen 5-325 MG tablet Commonly known as: PERCOCET/ROXICET Take 1 tablet by mouth every 4 (four) hours as needed for moderate pain.   PRENATAL ADULT GUMMY/DHA/FA PO Take 2 tablets by mouth daily.       Diet: carb modified diet  Activity: Advance as tolerated. Pelvic rest for 6 weeks.   Outpatient follow up:6 weeks Follow up Appt: Future Appointments  Date Time Provider [place]  07/24/2019  9:00 AM Doree Albee, MD [place] None   Follow up Visit: Follow-up Information    [facility] Follow up.   Specialty: Obstetrics and Gynecology Why: In 2 weeks for an incision check Contact information: [address] [place] [phone]           Please schedule this patient for Postpartum visit in: 6 weeks with the following provider: Any provider In-Person For C/S patients schedule nurse incision check in weeks 2 weeks: yes High risk pregnancy complicated by: GDM Delivery mode:  CS Anticipated Birth Control:  PP IUD Placed PP Procedures needed: Incision check, 2hr GTT  Schedule Integrated BH visit: no   Newborn Data: Live born female  APGAR: 70, 15  Newborn Delivery   Birth date/time: 05/26/2019 13:15:00 Delivery type: C-Section, Low Transverse Trial of labor: Yes C-section categorization: Repeat      Baby Feeding: Bottle Disposition:home with mother   05/28/2019 Wende Mott, CNM

## 2019-05-26 NOTE — Progress Notes (Signed)
Vitals:   05/26/19 0533 05/26/19 0630  BP: (!) 93/55 114/77  Pulse: 78 92  Resp:    Temp: 98 F (36.7 C)   SpO2:     Pt wants CS, is "over it".  Doesn't want to continue with Pitocin until CS can be done later this am "just in case."  No change in cx. FHR Cat 1.  Pitocin off, NPO, Dr. Alysia Penna aware.

## 2019-05-26 NOTE — Op Note (Signed)
Kimberly May PROCEDURE DATE: 05/25/2019 - 05/26/2019  PREOPERATIVE DIAGNOSIS: Intrauterine pregnancy at  [redacted]w[redacted]d weeks gestation; patient declines vag del attempt  POSTOPERATIVE DIAGNOSIS: The same  PROCEDURE:     Cesarean Section and [place] IUD placement  SURGEON:  Dr. Mora Bellman  ASSISTANT: Dr. Dione Plover  INDICATIONS: Kimberly May is a 29 y.o. [id number] at [redacted]w[redacted]d scheduled for cesarean section secondary to patient declines vag del attempt.  The risks of cesarean section discussed with the patient included but were not limited to: bleeding which may require transfusion or reoperation; infection which may require antibiotics; injury to bowel, bladder, ureters or other surrounding organs; injury to the fetus; need for additional procedures including hysterectomy in the event of a life-threatening hemorrhage; placental abnormalities wth subsequent pregnancies, incisional problems, thromboembolic phenomenon and other postoperative/anesthesia complications. The patient concurred with the proposed plan, giving informed written consent for the procedure.    FINDINGS:  Viable female infant in cephalic presentation.  Apgars 9 and 9.  Clear amniotic fluid.  Intact placenta, three vessel cord.  Normal uterus, fallopian tubes and ovaries bilaterally.  ANESTHESIA:    Spinal INTRAVENOUS FLUIDS:1200 ml ESTIMATED BLOOD LOSS: 583 ml URINE OUTPUT:  100 ml SPECIMENS: Placenta sent to L&D COMPLICATIONS: None immediate  PROCEDURE IN DETAIL:  The patient received intravenous antibiotics and had sequential compression devices applied to her lower extremities while in the preoperative area.  She was then taken to the operating room where anesthesia was induced and was found to be adequate. A foley catheter was placed into her bladder and attached to Kimberly May gravity. She was then placed in a dorsal supine position with a leftward tilt, and prepped and draped in a sterile manner. After an adequate timeout was  performed, a Pfannenstiel skin incision was made with scalpel and carried through to the underlying layer of fascia. The fascia was incised in the midline and this incision was extended bilaterally using the Mayo scissors. Kocher clamps were applied to the superior aspect of the fascial incision and the underlying rectus muscles were dissected off bluntly. A similar process was carried out on the inferior aspect of the facial incision. The rectus muscles were separated in the midline bluntly and the peritoneum was entered bluntly. The Alexis self-retaining retractor was introduced into the abdominal cavity. Attention was turned to the lower uterine segment where a transverse hysterotomy was made with a scalpel and extended bilaterally bluntly. The infant was successfully delivered and delayed cord clamping was performed for 1 minute. The cord was clamped and cut and infant was handed over to awaiting neonatology team. Uterine massage was then administered and the placenta delivered intact with three-vessel cord. The uterus was cleared of clot and debris. The [place] IUD strings were trimmed. The IUD was placed at the fundus and strings tracked into the cervical canal. The hysterotomy was closed with 0 Vicryl in a running locked fashion, and an imbricating layer was also placed with a 0 Vicryl. Overall, excellent hemostasis was noted. The pelvis copiously irrigated and cleared of all clot and debris. Hemostasis was confirmed on all surfaces.  The peritoneum and the muscles were reapproximated using 0 vicryl interrupted stitches. The fascia was then closed using 0 Vicryl in a running fashion.  The subcutaneous layer was reapproximated with plain gut and the skin was closed in a subcuticular fashion using 3.0 Vicryl. The patient tolerated the procedure well. Sponge, lap, instrument and needle counts were correct x 2. She was taken to the recovery room in stable condition.  Kimberly May ConstantMD  05/26/2019 1:36 PM

## 2019-05-26 NOTE — Addendum Note (Signed)
Addendum  created 05/26/19 1805 by Kaylyn Layer, MD   Intraprocedure LDAs edited, LDA properties accepted

## 2019-05-26 NOTE — Progress Notes (Signed)
Vitals:   05/26/19 0128 05/26/19 0133  BP:  115/76  Pulse:  94  Resp:    Temp: 98.3 F (36.8 C)   SpO2:     BS 74  Comfortable w/ epidural.  Cx 6-7/60/-3, no change in 4 hours.  MVU's <100, pitocin at 20 mu/min.  FHR Cat 1.  Will stop pitocin for a break and restart.  Pt and FOB happy w/plan, eager for VBAC if possible.

## 2019-05-26 NOTE — Anesthesia Procedure Notes (Signed)
Spinal  Patient location during procedure: OR Start time: 05/26/2019 12:52 PM End time: 05/26/2019 12:55 PM Staffing Performed: anesthesiologist  Anesthesiologist: Kaylyn Layer, MD Preanesthetic Checklist Completed: patient identified, IV checked, risks and benefits discussed, monitors and equipment checked, pre-op evaluation and timeout performed Spinal Block Patient position: sitting Prep: DuraPrep and site prepped and draped Patient monitoring: heart rate, continuous pulse ox and blood pressure Approach: midline Location: L3-4 Injection technique: single-shot Needle Needle type: Pencan  Needle gauge: 24 G Needle length: 10 cm Assessment Sensory level: T4 Additional Notes Risks, benefits, and alternative discussed. Patient gave consent to procedure. Prepped and draped in sitting position. Clear CSF obtained after one needle redirection. Positive terminal aspiration. No pain or paraesthesias with injection. Patient tolerated procedure well. Vital signs stable. Amalia Greenhouse, MD

## 2019-05-26 NOTE — Progress Notes (Signed)
Patient ID: Kimberly May, female   DOB: 1991-04-21, 29 y.o.   MRN: [id number] 29 yo G3P1011 at [redacted]w[redacted]d with GDMA2 opted for elective repeat cesarean section. Risks, benefits and alternatives were explained including but not limited to risks of bleeding, infection and damage to adjacent organs. Patient verbalized understanding and all questions were answered. Patient desires [name] IUD and agrees to having it placed post placental delivery.  FHT: baseline 125, mod variability, +accels, no deces Toco: no contractions  Patient to remain NPO

## 2019-05-27 LAB — CREATININE, SERUM
Creatinine, Ser: 0.58 mg/dL (ref 0.44–1.00)
GFR calc Af Amer: >60 mL/min (ref >60)
GFR calc non Af Amer: >60 mL/min (ref >60)

## 2019-05-27 LAB — CBC
HCT: 27.6 % — ABNORMAL LOW (ref 36.0–46.0)
Hemoglobin: 9.2 g/dL — ABNORMAL LOW (ref 12.0–15.0)
MCH: 27.9 pg (ref 26.0–34.0)
MCHC: 33.3 g/dL (ref 30.0–36.0)
MCV: 83.6 fL (ref 80.0–100.0)
Platelets: 250 10*3/uL (ref 150–400)
RBC: 3.3 MIL/uL — ABNORMAL LOW (ref 3.87–5.11)
RDW: 14.6 % (ref 11.5–15.5)
WBC: 13.1 10*3/uL — ABNORMAL HIGH (ref 4.0–10.5)
nRBC: 0 % (ref 0.0–0.2)

## 2019-05-27 LAB — GLUCOSE, CAPILLARY: Glucose-Capillary: 103 mg/dL — ABNORMAL HIGH (ref 70–99)

## 2019-05-27 MED ORDER — FERROUS SULFATE 325 (65 FE) MG PO TABS
325.0000 mg | ORAL_TABLET | Freq: Two times a day (BID) | ORAL | Status: DC
  Administered 2019-05-27 – 2019-05-28 (×3): 325 mg via ORAL
  Filled 2019-05-27 (×3): qty 1

## 2019-05-27 NOTE — Progress Notes (Signed)
POSTPARTUM PROGRESS NOTE  Post Operative Day 1  Subjective:  Kimberly May is a 29 y.o. [id number] s/p RCS at [redacted]w[redacted]d.  No acute events overnight.  Pt denies problems with ambulating, voiding or po intake.  She denies nausea or vomiting.  Pain is moderately controlled on ibuprofen and tylenol, not taking oxycodone.  She has had flatus. She has not had bowel movement.  Lochia Moderate.   Objective: Blood pressure (!) 110/59, pulse 91, temperature 98 F (36.7 C), temperature source Oral, resp. rate 16, height 5' (1.524 m), weight 110.2 kg, last menstrual period 08/23/2018, SpO2 97 %, unknown if currently breastfeeding.  Physical Exam:  General: alert, cooperative and no distress Chest: no respiratory distress Heart:regular rate, distal pulses intact Abdomen: soft, nontender,  Uterine Fundus: firm, appropriately tender DVT Evaluation: No calf swelling or tenderness Extremities: no edema Skin: PICO dressing in place -intact and working well   Recent Labs    05/25/19 0004 05/27/19 0547  HGB 11.7* 9.2*  HCT 35.4* 27.6*    Assessment/Plan: Kimberly May is a 29 y.o. [id number] s/p RCS at [redacted]w[redacted]d   POD#1 - Doing well Contraception: PP IUD placed  Feeding: formula  Dispo: Plan for discharge tomorrow- patient would like to go home tomorrow. Hgb 9.2- started on ferrous sulfate BID    LOS: 2 days   Sharyon Cable, CNM 05/27/2019, 8:07 AM

## 2019-05-28 MED ORDER — OXYCODONE-ACETAMINOPHEN 5-325 MG PO TABS: 1.0000 | ORAL_TABLET | ORAL | 0 refills | Status: DC | PRN

## 2019-05-28 MED ORDER — IBUPROFEN 800 MG PO TABS: 800.0000 mg | ORAL_TABLET | Freq: Three times a day (TID) | ORAL | 0 refills | Status: DC

## 2019-05-28 NOTE — Discharge Instructions (Signed)

## 2019-06-02 ENCOUNTER — Ambulatory Visit (INDEPENDENT_AMBULATORY_CARE_PROVIDER_SITE_OTHER): Payer: Medicaid Other | Admitting: Obstetrics & Gynecology

## 2019-06-02 ENCOUNTER — Other Ambulatory Visit: Payer: Self-pay

## 2019-06-02 ENCOUNTER — Encounter: Payer: Self-pay | Admitting: Obstetrics & Gynecology

## 2019-06-02 VITALS — BP 143/79 | HR 88 | Ht 60.0 in | Wt 242.0 lb

## 2019-06-02 DIAGNOSIS — Z98891 History of uterine scar from previous surgery: Secondary | ICD-10-CM

## 2019-06-02 MED ORDER — OXYCODONE-ACETAMINOPHEN 5-325 MG PO TABS: 1.0000 | ORAL_TABLET | ORAL | 0 refills | Status: DC | PRN

## 2019-06-02 MED ORDER — IBUPROFEN 800 MG PO TABS: 800.0000 mg | ORAL_TABLET | Freq: Three times a day (TID) | ORAL | 1 refills | Status: DC | PRN

## 2019-06-02 NOTE — Progress Notes (Signed)
  HPI: Patient returns for routine postoperative follow-up having undergone repeat c section  on 05/26/19.  The patient's immediate postoperative recovery has been unremarkable. Since hospital discharge the patient reports no problems.   Current Outpatient Medications: .  ibuprofen (ADVIL) 800 MG tablet, Take 1 tablet (800 mg total) by mouth every 8 (eight) hours., Disp: 30 tablet, Rfl: 0 .  oxyCODONE-acetaminophen (PERCOCET/ROXICET) 5-325 MG tablet, Take 1 tablet by mouth every 4 (four) hours as needed for moderate pain., Disp: 20 tablet, Rfl: 0 .  Prenatal MV & Min w/FA-DHA (PRENATAL ADULT GUMMY/DHA/FA PO), Take 2 tablets by mouth daily. , Disp: , Rfl:  .  ibuprofen (ADVIL) 800 MG tablet, Take 1 tablet (800 mg total) by mouth every 8 (eight) hours as needed., Disp: 60 tablet, Rfl: 1 .  oxyCODONE-acetaminophen (PERCOCET/ROXICET) 5-325 MG tablet, Take 1 tablet by mouth every 4 (four) hours as needed for severe pain., Disp: 20 tablet, Rfl: 0  No current facility-administered medications for this visit.    Blood pressure (!) 143/79, pulse 88, height 5' (1.524 m), weight 242 lb (109.8 kg), last menstrual period 08/23/2018, not currently breastfeeding.  Physical Exam: Incision clean dry intact provena removed Abdomen normal post op  Diagnostic Tests:   Pathology:   Impression: Normal post op course: C section  Plan: Meds ordered this encounter  Medications  . oxyCODONE-acetaminophen (PERCOCET/ROXICET) 5-325 MG tablet    Sig: Take 1 tablet by mouth every 4 (four) hours as needed for severe pain.    Dispense:  20 tablet    Refill:  0  . ibuprofen (ADVIL) 800 MG tablet    Sig: Take 1 tablet (800 mg total) by mouth every 8 (eight) hours as needed.    Dispense:  60 tablet    Refill:  1     Follow up: 4  weeks  Lazaro Arms, MD

## 2019-06-26 ENCOUNTER — Ambulatory Visit: Payer: Medicaid Other | Admitting: Advanced Practice Midwife

## 2019-07-24 ENCOUNTER — Ambulatory Visit (INDEPENDENT_AMBULATORY_CARE_PROVIDER_SITE_OTHER): Payer: Medicaid Other | Admitting: Internal Medicine

## 2019-07-31 ENCOUNTER — Encounter (INDEPENDENT_AMBULATORY_CARE_PROVIDER_SITE_OTHER): Payer: Self-pay

## 2019-07-31 ENCOUNTER — Ambulatory Visit (INDEPENDENT_AMBULATORY_CARE_PROVIDER_SITE_OTHER): Payer: Medicaid Other | Admitting: Internal Medicine

## 2019-08-07 ENCOUNTER — Telehealth (INDEPENDENT_AMBULATORY_CARE_PROVIDER_SITE_OTHER): Payer: Self-pay | Admitting: Nurse Practitioner

## 2019-08-07 NOTE — Telephone Encounter (Signed)
I left her a voicemail to return my call.

## 2019-08-07 NOTE — Telephone Encounter (Signed)
Kimberly May, will you call this patient and ask her if she is at a point where she would be able to complete her follow-up ultrasound of left breast?  We are holding off on doing this due to recent pregnancy, but she is overdue for this. If she is lactating (breast feeding) we may need to hold off longer, so please ask her if she is lactating and let me know. Thank you.

## 2019-09-11 ENCOUNTER — Telehealth (INDEPENDENT_AMBULATORY_CARE_PROVIDER_SITE_OTHER): Payer: Self-pay | Admitting: Nurse Practitioner

## 2019-09-11 NOTE — Telephone Encounter (Addendum)
Kimberly May, please call this patient and see if she is at a point where she would be willing to undergo follow-up ultrasound of her left breast.  She may be lactating and if this is the case we may need to hold off a little longer, however she is overdue for this test.  Thank you.

## 2019-09-11 NOTE — Telephone Encounter (Signed)
Left a voicemail to please return my call

## 2019-11-14 ENCOUNTER — Ambulatory Visit (INDEPENDENT_AMBULATORY_CARE_PROVIDER_SITE_OTHER): Payer: Medicaid Other | Admitting: Nurse Practitioner

## 2019-11-14 ENCOUNTER — Other Ambulatory Visit: Payer: Self-pay

## 2019-11-14 ENCOUNTER — Encounter (INDEPENDENT_AMBULATORY_CARE_PROVIDER_SITE_OTHER): Payer: Self-pay | Admitting: Nurse Practitioner

## 2019-11-14 VITALS — BP 125/80 | Temp 97.5°F | Ht 60.0 in | Wt 241.4 lb

## 2019-11-14 DIAGNOSIS — N63 Unspecified lump in unspecified breast: Secondary | ICD-10-CM | POA: Diagnosis not present

## 2019-11-14 DIAGNOSIS — R635 Abnormal weight gain: Secondary | ICD-10-CM

## 2019-11-14 NOTE — Patient Instructions (Signed)
[facility] Dietary Recommendations for Weight Loss What to Avoid . Avoid added sugars o Often added sugar can be found in processed foods such as many condiments, dry cereals, cakes, cookies, chips, crisps, crackers, candies, sweetened drinks, etc.  o Read labels and AVOID/DECREASE use of foods with the following in their ingredient list: Sugar, fructose, high fructose corn syrup, sucrose, glucose, maltose, dextrose, molasses, cane sugar, brown sugar, any type of syrup, agave nectar, etc.   . Avoid snacking in between meals . Avoid foods made with flour o If you are going to eat food made with flour, choose those made with whole-grains; and, minimize your consumption as much as is tolerable . Avoid processed foods o These foods are generally stocked in the middle of the grocery store. Focus on shopping on the perimeter of the grocery.  . Avoid Meat  o We recommend following a plant-based diet at [facility]. Thus, we recommend avoiding meat as a general rule. Consider eating beans, legumes, eggs, and/or dairy products for regular protein sources o If you plan on eating meat limit to 4 ounces of meat at a time and choose lean options such as Fish, chicken, turkey. Avoid red meat intake such as pork and/or steak What to Include . Vegetables o GREEN LEAFY VEGETABLES: Kale, spinach, mustard greens, collard greens, cabbage, broccoli, etc. o OTHER: Asparagus, cauliflower, eggplant, carrots, peas, Brussel sprouts, tomatoes, bell peppers, zucchini, beets, cucumbers, etc. . Grains, seeds, and legumes o Beans: kidney beans, black eyed peas, garbanzo beans, black beans, pinto beans, etc. o Whole, unrefined grains: brown rice, barley, bulgur, oatmeal, etc. . Healthy fats  o Avoid highly processed fats such as vegetable oil o Examples of healthy fats: avocado, olives, virgin olive oil, dark chocolate (?72% Cocoa), nuts (peanuts, almonds, walnuts, cashews, pecans, etc.) . None to Low  Intake of Animal Sources of Protein o Meat sources: chicken, turkey, salmon, tuna. Limit to 4 ounces of meat at one time. o Consider limiting dairy sources, but when choosing dairy focus on: PLAIN Greek yogurt, cottage cheese, high-protein milk . Fruit o Choose berries  When to Eat . Intermittent Fasting: o Choosing not to eat for a specific time period, but DO FOCUS ON HYDRATION when fasting o Multiple Techniques: - Time Restricted Eating: eat 3 meals in a day, each meal lasting no more than 60 minutes, no snacks between meals - 16-18 hour fast: fast for 16 to 18 hours up to 7 days a week. Often suggested to start with 2-3 nonconsecutive days per week.  . Remember the time you sleep is counted as fasting.  . Examples of eating schedule: Fast from 7:00pm-11:00am. Eat between 11:00am-7:00pm.  - 24-hour fast: fast for 24 hours up to every other day. Often suggested to start with 1 day per week . Remember the time you sleep is counted as fasting . Examples of eating schedule:  o Eating day: eat 2-3 meals on your eating day. If doing 2 meals, each meal should last no more than 90 minutes. If doing 3 meals, each meal should last no more than 60 minutes. Finish last meal by 7:00pm. o Fasting day: Fast until 7:00pm.  o IF YOU FEEL UNWELL FOR ANY REASON/IN ANY WAY WHEN FASTING, STOP FASTING BY EATING A NUTRITIOUS SNACK OR LIGHT MEAL o ALWAYS FOCUS ON HYDRATION DURING FASTS - Acceptable Hydration sources: water, broths, tea/coffee (black tea/coffee is best but using a small amount of whole-fat dairy products in coffee/tea is acceptable).  -   Poor Hydration Sources: anything with sugar or artificial sweeteners added to it  These recommendations have been developed for patients that are actively receiving medical care from either Dr. [facility] or Sivan Quast, DNP, NP-C at [facility]. These recommendations are developed for patients with specific medical conditions and are not meant to be  distributed or used by others that are not actively receiving care from either provider listed above at [facility]. It is not appropriate to participate in the above eating plans without proper medical supervision.   Reference: Fung, J. The obesity code. [place]: [place]; 2016.   

## 2019-11-14 NOTE — Progress Notes (Signed)
Subjective:  Patient ID: Kimberly May, female    DOB: 1991-02-01  Age: 29 y.o. MRN: [id number]  CC:  Chief Complaint  Patient presents with  . Weight Loss      HPI  This patient presents today for visit for the above.  She is here to discuss weight loss.  She recently gave birth to her second child 6 months ago.  She tells me she was diagnosed with gestational diabetes, and has had difficulty with maintaining a healthy weight for quite some time.  She has discussed intermittent fasting with Dr. Anastasio Champion in the past, however had to stop fasting due to her pregnancy shortly after discussing this.  She is interested in starting on occasion such as phentermine to help her with weight loss.  She tells me she has not ever really been successful with losing weight in the past, but has not really tried either.  Of note, she was noted to have a breast mass in the right lower quadrant of her left breast back in October 2019.  She did undergo imaging in November 2019 with subsequent follow-up in July 2020.  Imaging showed probable benign lesion.  It was recommended that she undergo imaging at least 1 more time to ensure that she had at least 1 year of stability in size of the mass.  She ended up holding off on getting subsequent imaging due to her pregnancy and lactation.  Today, she tells me she is no longer breast-feeding lactating.  She would be willing to undergo ultrasound.   Past Medical History:  Diagnosis Date  . Chlamydia 08/03/2016   Treated 3/26 pt and partner Duwaine Maxin, 07/10/97.POC 4/23 at 4 pm, no sex and [place] sent  . Tuberculosis 28 YRS AGO    FATHER HAD TB , PT WAS MED [place]      Family History  Problem Relation Age of Onset  . Cancer Father        larynx  . Pneumonia Father   . Other Paternal Grandfather        colon tumor  . Thyroid disease Paternal Grandmother   . Cancer Maternal Grandmother        hodgkins lymphoma, cervical  . Heart disease Maternal Grandmother    . Asthma Son   . ADD / ADHD Son     Social History   Social History Narrative  . Not on file   Social History   Tobacco Use  . Smoking status: Never Smoker  . Smokeless tobacco: Never Used  Substance Use Topics  . Alcohol use: No     No outpatient medications have been marked as taking for the 11/14/19 encounter (Office Visit) with Ailene Ards, NP.    ROS:  Review of Systems  Respiratory: Negative.   Cardiovascular: Negative.   Neurological: Negative.      Objective:   Today's Vitals: BP 125/80 (BP Location: Left Arm, Patient Position: Sitting, Cuff Size: Normal)   Temp (!) 97.5 F (36.4 C) (Temporal)   Ht 5' (1.524 m)   Wt 241 lb 6.4 oz (109.5 kg)   BMI 47.15 kg/m  Vitals with BMI 11/14/2019 06/02/2019 05/28/2019  Height _0  _1  -  Weight 241 lbs 6 oz 242 lbs -  BMI 88.89 16.94 -  Systolic 503 888 280  Diastolic 80 79 67  Pulse - 88 89     Physical Exam Vitals reviewed.  Constitutional:      General: She is not in  acute distress.    Appearance: Normal appearance.  HENT:     Head: Normocephalic and atraumatic.  Neck:     Vascular: No carotid bruit.  Cardiovascular:     Rate and Rhythm: Normal rate and regular rhythm.     Pulses: Normal pulses.     Heart sounds: Normal heart sounds.  Pulmonary:     Effort: Pulmonary effort is normal.     Breath sounds: Normal breath sounds.  Skin:    General: Skin is warm and dry.  Neurological:     General: No focal deficit present.     Mental Status: She is alert and oriented to person, place, and time.  Psychiatric:        Mood and Affect: Mood normal.        Behavior: Behavior normal.        Judgment: Judgment normal.          Assessment and Plan   1. Weight gain   2. Breast mass      Plan: 1.  I will collect blood work for further evaluation including CBC, CMP, TSH, and A1c.  We did discuss that I would prefer we start with lifestyle and behavioral changes to help with weight loss.  We did  discuss intermittent fasting, and how to accomplish this safely.  The first goal will be to start with a 16-hour fast most days of the week, and she will try to focus on healthy food choices when she does eat.  We did discuss the importance of avoiding processed carbohydrates and try to focus on whole foods.  We did discuss the importance of hydration especially while she is fasting with either water or broths.  We did discuss if she feels unwell while fasting she should try to drink some water or broth and if she is continues to feel unwell for approximately 15 to 20 minutes then she should break her fast with a healthy snack.  2.  I will order ultrasound of the left breast for further evaluation of her mass.  Hopefully she will not require additional imaging follow-up if mass remains stable.   Tests ordered Orders Placed This Encounter  Procedures  . TSH  . CMP with eGFR([facility])  . Hemoglobin A1c  . CBC      No orders of the defined types were placed in this encounter.   Patient to follow-up in 3 months or sooner as needed.  Ailene Ards, NP

## 2019-11-15 ENCOUNTER — Encounter (INDEPENDENT_AMBULATORY_CARE_PROVIDER_SITE_OTHER): Payer: Self-pay | Admitting: Nurse Practitioner

## 2019-11-15 LAB — COMPLETE METABOLIC PANEL WITH GFR
AG Ratio: 1.3 (calc) (ref 1.0–2.5)
ALT: 18 U/L (ref 6–29)
AST: 10 U/L (ref 10–30)
Albumin: 4 g/dL (ref 3.6–5.1)
Alkaline phosphatase (APISO): 67 U/L (ref 31–125)
BUN/Creatinine Ratio: 23 (calc) — ABNORMAL HIGH (ref 6–22)
BUN: 11 mg/dL (ref 7–25)
CO2: 28 mmol/L (ref 20–32)
Calcium: 9.1 mg/dL (ref 8.6–10.2)
Chloride: 103 mmol/L (ref 98–110)
Creat: 0.47 mg/dL — ABNORMAL LOW (ref 0.50–1.10)
GFR, Est African American: 155 mL/min/{1.73_m2} (ref >=60)
GFR, Est Non African American: 133 mL/min/{1.73_m2} (ref >=60)
Globulin: 3 g/dL (calc) (ref 1.9–3.7)
Glucose, Bld: 107 mg/dL — ABNORMAL HIGH (ref 65–99)
Potassium: 3.9 mmol/L (ref 3.5–5.3)
Sodium: 139 mmol/L (ref 135–146)
Total Bilirubin: 0.2 mg/dL (ref 0.2–1.2)
Total Protein: 7 g/dL (ref 6.1–8.1)

## 2019-11-15 LAB — CBC
HCT: 37.5 % (ref 35.0–45.0)
Hemoglobin: 12.3 g/dL (ref 11.7–15.5)
MCH: 25.8 pg — ABNORMAL LOW (ref 27.0–33.0)
MCHC: 32.8 g/dL (ref 32.0–36.0)
MCV: 78.6 fL — ABNORMAL LOW (ref 80.0–100.0)
MPV: 10.2 fL (ref 7.5–12.5)
Platelets: 279 10*3/uL (ref 140–400)
RBC: 4.77 10*6/uL (ref 3.80–5.10)
RDW: 14.7 % (ref 11.0–15.0)
WBC: 9.4 10*3/uL (ref 3.8–10.8)

## 2019-11-15 LAB — HEMOGLOBIN A1C
Hgb A1c MFr Bld: 5.7 % of total Hgb — ABNORMAL HIGH (ref <5.7)
Mean Plasma Glucose: 117 (calc)
eAG (mmol/L): 6.5 (calc)

## 2019-11-15 LAB — TSH: TSH: 1.1 mIU/L

## 2019-11-23 ENCOUNTER — Ambulatory Visit (HOSPITAL_COMMUNITY): Admission: RE | Admit: 2019-11-23 | Payer: Medicaid Other | Source: Ambulatory Visit

## 2019-12-26 ENCOUNTER — Ambulatory Visit (INDEPENDENT_AMBULATORY_CARE_PROVIDER_SITE_OTHER): Payer: Medicaid Other | Admitting: Internal Medicine

## 2020-02-20 ENCOUNTER — Telehealth (INDEPENDENT_AMBULATORY_CARE_PROVIDER_SITE_OTHER): Payer: Self-pay | Admitting: Nurse Practitioner

## 2020-02-20 ENCOUNTER — Ambulatory Visit (INDEPENDENT_AMBULATORY_CARE_PROVIDER_SITE_OTHER): Payer: Medicaid Other | Admitting: Nurse Practitioner

## 2020-02-20 NOTE — Telephone Encounter (Signed)
This patient was a no-show for her appointment today, however per chart review I do see that I had ordered ultrasound of the left breast which was never scheduled.  Will you look into this?  And see if we can get this scheduled for her?  Thank you.

## 2020-02-22 ENCOUNTER — Other Ambulatory Visit (INDEPENDENT_AMBULATORY_CARE_PROVIDER_SITE_OTHER): Payer: Self-pay | Admitting: Nurse Practitioner

## 2020-02-22 DIAGNOSIS — N63 Unspecified lump in unspecified breast: Secondary | ICD-10-CM

## 2020-02-22 NOTE — Telephone Encounter (Signed)
Ordr in pendinc prior auth now.

## 2020-02-22 NOTE — Telephone Encounter (Signed)
See the note from Nellie.

## 2020-02-22 NOTE — Progress Notes (Signed)
Order for ultrasound of the left breast

## 2020-02-26 NOTE — Progress Notes (Signed)
So look like she ws schedule for mobile; but canceled and sent central scheduling. Not sure why she canceled. But called her to find out what happen. Will keep you posted.

## 2020-02-27 ENCOUNTER — Encounter (INDEPENDENT_AMBULATORY_CARE_PROVIDER_SITE_OTHER): Payer: Self-pay | Admitting: Nurse Practitioner

## 2020-02-27 ENCOUNTER — Telehealth (INDEPENDENT_AMBULATORY_CARE_PROVIDER_SITE_OTHER): Payer: Self-pay | Admitting: Nurse Practitioner

## 2020-02-27 NOTE — Telephone Encounter (Signed)
Letter for patient regarding asking her to call us to set up and appointment was written and is in chart. Please print this and have Clydie Braun mail to patient's address. Thank you.

## 2020-02-27 NOTE — Telephone Encounter (Signed)
Thank you will do 

## 2021-03-05 ENCOUNTER — Encounter: Payer: Self-pay | Admitting: Internal Medicine

## 2021-03-05 ENCOUNTER — Other Ambulatory Visit: Payer: Self-pay

## 2021-03-05 ENCOUNTER — Ambulatory Visit: Payer: Medicaid Other | Admitting: Internal Medicine

## 2021-03-05 VITALS — BP 134/76 | HR 90 | Temp 98.0°F | Resp 18 | Ht 60.0 in | Wt 252.0 lb

## 2021-03-05 DIAGNOSIS — Z0001 Encounter for general adult medical examination with abnormal findings: Secondary | ICD-10-CM | POA: Diagnosis not present

## 2021-03-05 DIAGNOSIS — Z0000 Encounter for general adult medical examination without abnormal findings: Secondary | ICD-10-CM | POA: Diagnosis not present

## 2021-03-05 DIAGNOSIS — Z1159 Encounter for screening for other viral diseases: Secondary | ICD-10-CM | POA: Diagnosis not present

## 2021-03-05 DIAGNOSIS — E559 Vitamin D deficiency, unspecified: Secondary | ICD-10-CM

## 2021-03-05 DIAGNOSIS — Z1329 Encounter for screening for other suspected endocrine disorder: Secondary | ICD-10-CM

## 2021-03-05 DIAGNOSIS — R7303 Prediabetes: Secondary | ICD-10-CM | POA: Diagnosis not present

## 2021-03-05 NOTE — Assessment & Plan Note (Signed)
Diet modification and moderate exercise discussed GLP1 agonist would be appropriate for her due to h/o prediabetes and gestational DM Discussed about weight loss clinic, she will think about it.

## 2021-03-05 NOTE — Assessment & Plan Note (Signed)
Care established Previous chart reviewed History and medications reviewed with the patient 

## 2021-03-05 NOTE — Progress Notes (Signed)
New Patient Office Visit  Subjective:  Patient ID: Kimberly May, female    DOB: 1991/03/02  Age: 30 y.o. MRN: [id number]  CC:  Chief Complaint  Patient presents with   New Patient (Initial Visit)    New patient was seeing Anastasio Champion would like to discuss weight loss    HPI Kimberly Fassler is a 30 year old female with PMH of prediabetes, and morbid obesity presents for establishing care.  She has h/o gestational diabetes and was on Metformin during her second pregnancy. Her HbA1C was 5.7 in 2021. She has been gaining weight since then. She is interested in weight loss. She admits that she needs to improve diet and needs to cut down her soda intake as takes it daily. She denies chronic fatigue, polyuria or polydipsia. Denies any recent change in appetite.  She works from home for some days and goes to office sometimes. She has fairly sedentary lifestyle. No smoking.  She is going to get flu vaccine at her workplace.  Past Medical History:  Diagnosis Date   Acute respiratory failure ([place]) 06/10/2013   Chlamydia 08/03/2016   Treated 3/26 pt and partner Duwaine Maxin, 07/10/97.POC 4/23 at 4 pm, no sex and [place] sent   Gestational diabetes mellitus (GDM) affecting second pregnancy 05/25/2019   History of cesarean section 09/29/2018   For NRFHR   Wants TOLAC, consent signed 03/08/2019-[facility]   Septic shock ([place]) 06/10/2013   Tuberculosis 15 YRS AGO    FATHER HAD TB , PT WAS MED [place]    Past Surgical History:  Procedure Laterality Date   ADENOIDECTOMY     CESAREAN SECTION  2009    CESAREAN SECTION N/A 05/26/2019   Procedure: CESAREAN SECTION;  Surgeon: Mora Bellman, MD;  Location: [facility];  Service: Obstetrics;  Laterality: N/A;   DIRECT LARYNGOSCOPY N/A 06/14/2013   Procedure: DIRECT LARYNGOSCOPY/RE-EXPLORATORY OF NECK;  Surgeon: Jodi Marble, MD;  Location: [facility];  Service: ENT;  Laterality: N/A;   INCISION AND DRAINAGE ABSCESS N/A 06/04/2013   Procedure: INCISION AND DRAINAGE PHARYNGEAL  ABSCESS;  Surgeon: Ruby Cola, MD;  Location: [facility];  Service: ENT;  Laterality: N/A;   INCISION AND DRAINAGE OF PERITONSILLAR ABCESS Left 06/09/2013   Procedure: INCISION AND DRAINAGE OF left retropharyneal abscess;  Surgeon: Jodi Marble, MD;  Location: [facility];  Service: ENT;  Laterality: Left;   TONSILLECTOMY  04/10/2011   Procedure: TONSILLECTOMY;  Surgeon: Onnie Graham;  Location: [facility];  Service: ENT;  Laterality: Bilateral;   TONSILLECTOMY     TRACHEOSTOMY TUBE PLACEMENT N/A 06/14/2013   Procedure: TRACHEOSTOMY;  Surgeon: Jodi Marble, MD;  Location: [facility];  Service: ENT;  Laterality: N/A;   TYMPANOSTOMY TUBE PLACEMENT  DONE TWICE    BILAT    ([place])    Family History  Problem Relation Age of Onset   Cancer Father        larynx   Pneumonia Father    Other Paternal Grandfather        colon tumor   Thyroid disease Paternal Grandmother    Cancer Maternal Grandmother        hodgkins lymphoma, cervical   Heart disease Maternal Grandmother    Asthma Son    ADD / ADHD Son     Social History   Socioeconomic History   Marital status: Single    Spouse name: Jonnie Kind   Number of children: 1   Years of education: 12   Highest education level: Some college, no degree  Occupational History   Occupation: Paediatric nurse  Tobacco Use   Smoking status: Never   Smokeless tobacco: Never  Vaping Use   Vaping Use: Never used  Substance and Sexual Activity   Alcohol use: No   Drug use: No   Sexual activity: Yes    Birth control/protection: None  Other Topics Concern   Not on file  Social History Narrative   Not on file   Social Determinants of Health   Financial Resource Strain: Not on file  Food Insecurity: Not on file  Transportation Needs: Not on file  Physical Activity: Not on file  Stress: Not on file  Social Connections: Not on file  Intimate Partner Violence: Not on file    ROS Review of Systems  Constitutional:  Negative for chills and fever.  HENT:   Negative for congestion, sinus pressure, sinus pain and sore throat.   Eyes:  Negative for pain and discharge.  Respiratory:  Negative for cough and shortness of breath.   Cardiovascular:  Negative for chest pain and palpitations.  Gastrointestinal:  Negative for abdominal pain, constipation, diarrhea, nausea and vomiting.  Endocrine: Negative for polydipsia and polyuria.  Genitourinary:  Negative for dysuria and hematuria.  Musculoskeletal:  Negative for neck pain and neck stiffness.  Skin:  Negative for rash.  Neurological:  Negative for dizziness and weakness.  Psychiatric/Behavioral:  Negative for agitation and behavioral problems.    Objective:   Today's Vitals: BP 134/76 (BP Location: Left Arm, Cuff Size: Normal)   Pulse 90   Temp 98 F (36.7 C) (Oral)   Resp 18   Ht 5' (1.524 m)   Wt 252 lb (114.3 kg)   SpO2 97%   BMI 49.22 kg/m   Physical Exam Vitals reviewed.  Constitutional:      General: She is not in acute distress.    Appearance: She is obese. She is not diaphoretic.  HENT:     Head: Normocephalic and atraumatic.     Nose: Nose normal.     Mouth/Throat:     Mouth: Mucous membranes are moist.  Eyes:     General: No scleral icterus.    Extraocular Movements: Extraocular movements intact.  Cardiovascular:     Rate and Rhythm: Normal rate and regular rhythm.     Pulses: Normal pulses.     Heart sounds: Normal heart sounds. No murmur heard. Pulmonary:     Breath sounds: Normal breath sounds. No wheezing or rales.  Musculoskeletal:     Cervical back: Neck supple. No tenderness.     Right lower leg: No edema.     Left lower leg: No edema.  Skin:    General: Skin is warm.     Findings: No rash.  Neurological:     General: No focal deficit present.     Mental Status: She is alert and oriented to person, place, and time.     Sensory: No sensory deficit.     Motor: No weakness.  Psychiatric:        Mood and Affect: Mood normal.        Behavior: Behavior  normal.    Assessment & Plan:   Problem List Items Addressed This Visit       Other   Encounter for medical examination to establish care    Care established Previous chart reviewed History and medications reviewed with the patient      Relevant Orders   CBC with Differential/Platelet   Prediabetes    Lab Results  Component Value Date   HGBA1C 5.7 (H) 11/14/2019  Check CMP, HbA1C and lipid profile Advised to follow low carb diet and cut down soda intake      Relevant Orders   CMP14+EGFR   HgB A1c   Morbid obesity (HCC)    Diet modification and moderate exercise discussed GLP1 agonist would be appropriate for her due to h/o prediabetes and gestational DM Discussed about weight loss clinic, she will think about it.      Relevant Orders   TSH + free T4   Other Visit Diagnoses     Vitamin D deficiency    -  Primary   Relevant Orders   Vitamin D (25 hydroxy)   Screening for thyroid disorder       Relevant Orders   TSH + free T4   Need for hepatitis C screening test       Relevant Orders   Hepatitis C Antibody       No outpatient encounter medications on file as of 03/05/2021.   No facility-administered encounter medications on file as of 03/05/2021.    Follow-up: Return in about 3 months (around 06/05/2021) for Weight check.   Lindell Spar, MD

## 2021-03-05 NOTE — Assessment & Plan Note (Signed)
Lab Results  Component Value Date   HGBA1C 5.7 (H) 11/14/2019   Check CMP, HbA1C and lipid profile Advised to follow low carb diet and cut down soda intake

## 2021-03-05 NOTE — Patient Instructions (Signed)
Please follow low carb diet as discussed.  Please perform moderate exercise/walking at least 30 mins/week.

## 2021-03-06 LAB — CMP14+EGFR
ALT: 20 IU/L (ref 0–32)
AST: 15 IU/L (ref 0–40)
Albumin/Globulin Ratio: 1.4 (ref 1.2–2.2)
Albumin: 3.9 g/dL (ref 3.9–5.0)
Alkaline Phosphatase: 76 IU/L (ref 44–121)
BUN/Creatinine Ratio: 14 (ref 9–23)
BUN: 8 mg/dL (ref 6–20)
Bilirubin Total: 0.2 mg/dL (ref 0.0–1.2)
CO2: 23 mmol/L (ref 20–29)
Calcium: 8.9 mg/dL (ref 8.7–10.2)
Chloride: 103 mmol/L (ref 96–106)
Creatinine, Ser: 0.56 mg/dL — ABNORMAL LOW (ref 0.57–1.00)
Globulin, Total: 2.8 g/dL (ref 1.5–4.5)
Glucose: 131 mg/dL — ABNORMAL HIGH (ref 70–99)
Potassium: 4.1 mmol/L (ref 3.5–5.2)
Sodium: 139 mmol/L (ref 134–144)
Total Protein: 6.7 g/dL (ref 6.0–8.5)
eGFR: 126 mL/min/{1.73_m2} (ref >59)

## 2021-03-06 LAB — CBC WITH DIFFERENTIAL/PLATELET
Basophils Absolute: 0 10*3/uL (ref 0.0–0.2)
Basos: 0 %
EOS (ABSOLUTE): 0.1 10*3/uL (ref 0.0–0.4)
Eos: 1 %
Hematocrit: 41.1 % (ref 34.0–46.6)
Hemoglobin: 13.2 g/dL (ref 11.1–15.9)
Immature Grans (Abs): 0 10*3/uL (ref 0.0–0.1)
Immature Granulocytes: 0 %
Lymphocytes Absolute: 2.2 10*3/uL (ref 0.7–3.1)
Lymphs: 20 %
MCH: 27 pg (ref 26.6–33.0)
MCHC: 32.1 g/dL (ref 31.5–35.7)
MCV: 84 fL (ref 79–97)
Monocytes Absolute: 0.5 10*3/uL (ref 0.1–0.9)
Monocytes: 5 %
Neutrophils Absolute: 7.8 10*3/uL — ABNORMAL HIGH (ref 1.4–7.0)
Neutrophils: 74 %
Platelets: 264 10*3/uL (ref 150–450)
RBC: 4.88 x10E6/uL (ref 3.77–5.28)
RDW: 13 % (ref 11.7–15.4)
WBC: 10.7 10*3/uL (ref 3.4–10.8)

## 2021-03-06 LAB — TSH+FREE T4
Free T4: 1.16 ng/dL (ref 0.82–1.77)
TSH: 1.62 u[IU]/mL (ref 0.450–4.500)

## 2021-03-06 LAB — HEMOGLOBIN A1C
Est. average glucose Bld gHb Est-mCnc: 128 mg/dL
Hgb A1c MFr Bld: 6.1 % — ABNORMAL HIGH (ref 4.8–5.6)

## 2021-03-06 LAB — VITAMIN D 25 HYDROXY (VIT D DEFICIENCY, FRACTURES): Vit D, 25-Hydroxy: 23.8 ng/mL — ABNORMAL LOW (ref 30.0–100.0)

## 2021-03-06 LAB — HEPATITIS C ANTIBODY: Hep C Virus Ab: <0.1 s/co ratio (ref 0.0–0.9)

## 2021-06-05 ENCOUNTER — Ambulatory Visit: Payer: Medicaid Other | Admitting: Internal Medicine

## 2021-06-06 ENCOUNTER — Ambulatory Visit: Payer: Medicaid Other | Admitting: Internal Medicine

## 2021-06-18 DIAGNOSIS — J209 Acute bronchitis, unspecified: Secondary | ICD-10-CM | POA: Diagnosis not present

## 2021-06-18 DIAGNOSIS — J029 Acute pharyngitis, unspecified: Secondary | ICD-10-CM | POA: Diagnosis not present

## 2021-06-18 DIAGNOSIS — J019 Acute sinusitis, unspecified: Secondary | ICD-10-CM | POA: Diagnosis not present

## 2021-06-18 DIAGNOSIS — J111 Influenza due to unidentified influenza virus with other respiratory manifestations: Secondary | ICD-10-CM | POA: Diagnosis not present

## 2021-12-08 ENCOUNTER — Emergency Department (HOSPITAL_COMMUNITY)
Admission: EM | Admit: 2021-12-08 | Discharge: 2021-12-08 | Disposition: A | Discharge status: Home / Self Care | Payer: Medicaid Other | Attending: Emergency Medicine | Admitting: Emergency Medicine

## 2021-12-08 ENCOUNTER — Emergency Department (HOSPITAL_COMMUNITY): Payer: Medicaid Other

## 2021-12-08 ENCOUNTER — Other Ambulatory Visit: Payer: Self-pay

## 2021-12-08 ENCOUNTER — Encounter (HOSPITAL_COMMUNITY): Payer: Self-pay

## 2021-12-08 DIAGNOSIS — Z20822 Contact with and (suspected) exposure to covid-19: Secondary | ICD-10-CM | POA: Diagnosis not present

## 2021-12-08 DIAGNOSIS — J069 Acute upper respiratory infection, unspecified: Secondary | ICD-10-CM | POA: Diagnosis not present

## 2021-12-08 DIAGNOSIS — R059 Cough, unspecified: Secondary | ICD-10-CM | POA: Diagnosis not present

## 2021-12-08 DIAGNOSIS — Z91040 Latex allergy status: Secondary | ICD-10-CM | POA: Diagnosis not present

## 2021-12-08 DIAGNOSIS — R5381 Other malaise: Secondary | ICD-10-CM | POA: Insufficient documentation

## 2021-12-08 DIAGNOSIS — B9789 Other viral agents as the cause of diseases classified elsewhere: Secondary | ICD-10-CM | POA: Diagnosis not present

## 2021-12-08 LAB — RESP PANEL BY RT-PCR (FLU A&B, COVID) ARPGX2
Influenza A by PCR: NEGATIVE
Influenza B by PCR: NEGATIVE
SARS Coronavirus 2 by RT PCR: NEGATIVE

## 2021-12-08 MED ORDER — DEXAMETHASONE SODIUM PHOSPHATE 10 MG/ML IJ SOLN
10.0000 mg | Freq: Once | INTRAMUSCULAR | Status: AC
  Administered 2021-12-08: 10 mg via INTRAMUSCULAR
  Filled 2021-12-08: qty 1

## 2021-12-08 MED ORDER — ALBUTEROL SULFATE HFA 108 (90 BASE) MCG/ACT IN AERS
2.0000 | INHALATION_SPRAY | Freq: Once | RESPIRATORY_TRACT | Status: AC
  Administered 2021-12-08: 2 via RESPIRATORY_TRACT
  Filled 2021-12-08: qty 6.7

## 2021-12-08 MED ORDER — IPRATROPIUM-ALBUTEROL 0.5-2.5 (3) MG/3ML IN SOLN
3.0000 mL | Freq: Once | RESPIRATORY_TRACT | Status: AC
  Administered 2021-12-08: 3 mL via RESPIRATORY_TRACT
  Filled 2021-12-08: qty 3

## 2021-12-08 MED ORDER — BENZONATATE 200 MG PO CAPS: 200.0000 mg | ORAL_CAPSULE | Freq: Three times a day (TID) | ORAL | 0 refills | Status: DC | PRN

## 2021-12-08 NOTE — ED Triage Notes (Addendum)
Patient reports that she has been "sick" since 7/11. States that she has taking OTC meds with no relief. Reports chest wall and back pain when coughing. Productive cough. Concerned that she has PNA. Reports chills. Reports loss of taste and smell.

## 2021-12-08 NOTE — Discharge Instructions (Signed)
Your COVID and flu test today were negative.  Your x-ray did not show evidence of pneumonia.  This is likely a viral process.  I recommend that you drink plenty of fluids.  You may take Tylenol and/or ibuprofen if needed for body aches or fever.  Use the albuterol inhaler 2 puffs 4 times a day until your symptoms are improving then you may use as needed after that.  Follow-up with your primary care provider for recheck.  Turn to the emergency department for any new or worsening symptoms.

## 2021-12-08 NOTE — ED Provider Notes (Signed)
[facility] EMERGENCY DEPARTMENT Provider Note   CSN: [id number] Arrival date & time: 12/08/21  5170     History  Chief Complaint  Patient presents with   Cough    Kimberly May is a 31 y.o. female.   Cough Associated symptoms: chest pain, chills, ear pain, rhinorrhea and wheezing   Associated symptoms: no fever, no headaches, no myalgias, no rash, no shortness of breath and no sore throat       Kimberly Zirkelbach is a 31 y.o. female who presents to the Emergency Department complaining of nasal congestion, sinus drainage, cough and generalized malaise.  Symptoms have been present for greater than 2 weeks.  She was initially seen at onset of her symptoms at urgent care, she was prescribed steroids but states that she could not take them due to how they made her feel.  She has been treating herself with over-the-counter cold and cough medications without relief.  She attempted to return to work today and was told to see a doctor to be evaluated.  She is concerned that she has pneumonia.  She reports her cough is mostly productive of green to yellow sputum.  She is having some wheezing as well.  Reports chills but unsure of fever.  She has lost sense of taste and smell.  She denies any shortness of breath, abdominal pain, vomiting, diarrhea, or back pain.   Home Medications Prior to Admission medications   Not on File      Allergies    Latex and Penicillins    Review of Systems   Review of Systems  Constitutional:  Positive for chills. Negative for appetite change and fever.  HENT:  Positive for congestion, ear pain, rhinorrhea and sinus pain. Negative for hearing loss, sore throat and trouble swallowing.   Respiratory:  Positive for cough, chest tightness and wheezing. Negative for shortness of breath.   Cardiovascular:  Positive for chest pain.  Gastrointestinal:  Negative for abdominal pain, diarrhea, nausea and vomiting.  Genitourinary:  Negative for dysuria.  Musculoskeletal:   Negative for arthralgias, myalgias, neck pain and neck stiffness.  Skin:  Negative for rash.  Neurological:  Negative for syncope, weakness, numbness and headaches.    Physical Exam Updated Vital Signs BP (!) 161/93 (BP Location: Left Arm)   Pulse 82   Temp (!) 97.4 F (36.3 C) (Oral)   Resp 18   Ht 5' (1.524 m)   Wt 108 kg   SpO2 100%   BMI 46.48 kg/m  Physical Exam Vitals reviewed.  Constitutional:      Appearance: Normal appearance. She is not ill-appearing or toxic-appearing.  HENT:     Right Ear: Tympanic membrane and ear canal normal.     Left Ear: Decreased hearing noted. No drainage, swelling or tenderness. A middle ear effusion is present. No hemotympanum. Tympanic membrane is not erythematous or bulging.     Nose: Rhinorrhea present.     Mouth/Throat:     Mouth: Mucous membranes are moist.     Pharynx: Oropharynx is clear. Uvula midline. No pharyngeal swelling, oropharyngeal exudate, posterior oropharyngeal erythema or uvula swelling.     Tonsils: No tonsillar exudate.  Cardiovascular:     Rate and Rhythm: Normal rate and regular rhythm.     Pulses: Normal pulses.  Pulmonary:     Effort: Pulmonary effort is normal. No respiratory distress.     Breath sounds: Wheezing and rhonchi present.     Comments: Expiratory wheezes bilaterally, left greater than right Abdominal:  Palpations: Abdomen is soft.     Tenderness: There is no abdominal tenderness.  Musculoskeletal:        General: Normal range of motion.     Cervical back: Normal range of motion.  Lymphadenopathy:     Cervical: No cervical adenopathy.  Skin:    General: Skin is warm.     Capillary Refill: Capillary refill takes less than 2 seconds.     Findings: No rash.  Neurological:     General: No focal deficit present.     Mental Status: She is alert.     Sensory: No sensory deficit.     Motor: No weakness.     ED Results / Procedures / Treatments   Labs (all labs ordered are listed, but only  abnormal results are displayed) Labs Reviewed  RESP PANEL BY RT-PCR (FLU A&B, COVID) [id number]    EKG None  Radiology DG Chest Portable 1 View  Result Date: 12/08/2021 CLINICAL DATA:  Cough EXAM: PORTABLE CHEST 1 VIEW COMPARISON:  Radiograph 06/20/2013 FINDINGS: Cardiomediastinal silhouette is within normal limits. There is no focal airspace disease. There is no pleural effusion. No pneumothorax. No acute osseous abnormality. IMPRESSION: No evidence of acute cardiopulmonary disease. Electronically Signed   By: Caprice Renshaw M.D.   On: 12/08/2021 10:37     Procedures Procedures    Medications Ordered in ED Medications - No data to display  ED Course/ Medical Decision Making/ A&P                           Medical Decision Making Patient here for evaluation of 2-week history of cough and URI symptoms.  She has been taking over-the-counter cold and cough medications without relief.  Cough has been productive of colored sputum and associated with wheezing and some shortness of breath with exertion.  Reports chills at home but unsure of fever.  Denies any known COVID exposures, has not been vaccinated or had COVID in the past.  On exam, patient well-appearing nontoxic.  She has some expiratory wheezes bilaterally with left greater than right.  No increased work of breathing on my exam.  Nontoxic-appearing.  PERC negative.  Differential dx would include viral process, COVID, pneumonia, bronchitis and PE.  Given patient's duration of symptoms and she is PERC negative, PE is felt to be less likely.    Amount and/or Complexity of Data Reviewed Labs: ordered.    Details: COVID and flu test are negative Radiology: ordered.    Details: Chest x-ray without evidence of acute cardiopulmonary process Discussion of management or test interpretation with external provider(s): Likely viral process, patient agreeable to symptomatic treatment and close outpatient follow-up.  She appears appropriate for  discharge home.  Return precautions discussed  Risk Prescription drug management.           Final Clinical Impression(s) / ED Diagnoses Final diagnoses:  Viral URI with cough    Rx / DC Orders ED Discharge Orders     None         Pauline Aus, PA-C 12/12/21 1426    Gloris Manchester, MD 12/15/21 [phone]

## 2021-12-23 ENCOUNTER — Telehealth: Payer: Self-pay | Admitting: Obstetrics & Gynecology

## 2021-12-23 NOTE — Telephone Encounter (Signed)
Patient states she can feel her strings in her vagina but does not feel the IUD.  She was sick recently and was coughing a lot so thinks this may have made it move.  Only having slight cramping no major pain.  Advised patient to not pull on strings but if device comes out, to let us know.  Advised to use condoms until she is seen in case IUD is not in.  Pt verbalized understanding with no further questions.

## 2021-12-23 NOTE — Telephone Encounter (Signed)
Patient called to let us know that she felt like her iud is coming out. She also wants to discuss btl, I scheduled her with Dr. Charlotta Newton on 8/22 to get the iud checked and to discuss the btl. Please advise if she needs something else.

## 2021-12-23 NOTE — Telephone Encounter (Signed)
LMOVM returning patient's call.  

## 2021-12-30 ENCOUNTER — Other Ambulatory Visit (HOSPITAL_COMMUNITY)
Admission: RE | Admit: 2021-12-30 | Discharge: 2021-12-30 | Disposition: A | Discharge status: Home / Self Care | Payer: Medicaid Other | Source: Ambulatory Visit | Attending: Obstetrics & Gynecology | Admitting: Obstetrics & Gynecology

## 2021-12-30 ENCOUNTER — Ambulatory Visit (INDEPENDENT_AMBULATORY_CARE_PROVIDER_SITE_OTHER): Payer: Medicaid Other | Admitting: Obstetrics & Gynecology

## 2021-12-30 ENCOUNTER — Encounter: Payer: Self-pay | Admitting: Obstetrics & Gynecology

## 2021-12-30 VITALS — BP 133/84 | HR 86 | Wt 250.4 lb

## 2021-12-30 DIAGNOSIS — L304 Erythema intertrigo: Secondary | ICD-10-CM

## 2021-12-30 DIAGNOSIS — Z124 Encounter for screening for malignant neoplasm of cervix: Secondary | ICD-10-CM | POA: Insufficient documentation

## 2021-12-30 DIAGNOSIS — T8332XA Displacement of intrauterine contraceptive device, initial encounter: Secondary | ICD-10-CM

## 2021-12-30 DIAGNOSIS — Z3202 Encounter for pregnancy test, result negative: Secondary | ICD-10-CM | POA: Diagnosis not present

## 2021-12-30 DIAGNOSIS — Z30433 Encounter for removal and reinsertion of intrauterine contraceptive device: Secondary | ICD-10-CM

## 2021-12-30 LAB — POCT URINE PREGNANCY: Preg Test, Ur: NEGATIVE

## 2021-12-30 MED ORDER — LEVONORGESTREL 20 MCG/DAY IU IUD
1.0000 | INTRAUTERINE_SYSTEM | Freq: Once | INTRAUTERINE | Status: AC
  Administered 2021-12-30: 1 via INTRAUTERINE

## 2021-12-30 MED ORDER — CLOTRIMAZOLE-BETAMETHASONE 1-0.05 % EX CREA: 1.0000 | TOPICAL_CREAM | Freq: Two times a day (BID) | CUTANEOUS | 0 refills | Status: AC | PRN

## 2021-12-30 NOTE — Progress Notes (Signed)
GYN VISIT Patient name: Kimberly May July MRN [id number]  Date of birth: 06-Jun-1990 Chief Complaint:   Follow-up (Check iud)  History of Present Illness:   Kimberly May Kaffenberger is a 31 y.o. [phone] female being seen today for the following concerns:  -IUD migration- She is concerned that the device has moved because the strings feel very long.  Device placed during last c-section.  Menses are very light, only occasional spotting.  Denies pelvic or abdominal pain.  Also wishes to discuss option of tubal ligation as she does not desire another pregnancy.  Rash: She also notes discomfort and itching under her abdomen.  She is using neosporin with minimal improvement.  No LMP recorded. (Menstrual status: IUD).     03/05/2021   10:31 AM 11/24/2018    2:35 PM 04/26/2018    8:41 AM 07/30/2016    8:45 AM  Depression screen PHQ 2/9  Decreased Interest 0 0 0 0  Down, Depressed, Hopeless 0 0 0 0  PHQ - 2 Score 0 0 0 0  Altered sleeping  0    Tired, decreased energy  1    Change in appetite  0    Feeling bad or failure about yourself   0    Trouble concentrating  0    Moving slowly or fidgety/restless  0    Suicidal thoughts  0    PHQ-9 Score  1       Review of Systems:   Pertinent items are noted in HPI Denies fever/chills, dizziness, headaches, visual disturbances, fatigue, shortness of breath, chest pain, abdominal pain, vomiting, denies problems with periods, bowel movements, urination, or intercourse unless otherwise stated above.  Pertinent History Reviewed:  Reviewed past medical,surgical, social, obstetrical and family history.  Reviewed problem list, medications and allergies. Physical Assessment:   Vitals:   12/30/21 1517  BP: 133/84  Pulse: 86  Weight: 250 lb 6.4 oz (113.6 kg)  Body mass index is 48.9 kg/m.       Physical Examination:   General appearance: alert, well appearing, and in no distress  Psych: mood appropriate, normal affect  Skin: warm & dry   Cardiovascular:  normal heart rate noted  Respiratory: normal respiratory effort, no distress  Abdomen: obese, soft, non-tender   Pelvic: normal external genitalia, vulva, vagina, cervix visualized- IUD device not seen; however long strings noted and trimmed  Extremities: no edema   Bedside US completed- Uterus seen with IUD in lower uterine segment  Chaperone: Faith Rogue    Assessment & Plan:  1) IUD migration, Contraceptive management -based on long strings and pelvic US recommendation for IUD replacement -discussed IUD vs tubal ligation.  Reviewed risk/benefit of surgery including risk of bleeding, infection and injury to surrounding organs -of note, pt would desire to keep IUD to help regulate her menses -after much discussion she desires IUD replacement.  Inform consent obtained -see below for procedure note, f/u in 6 wks to check strings  2) Cervical cancer screening -last pap 2018, collected today -reviewed ASCCP guidelines  3) Intertrigo -ointment sent in -reviewed conservative management  Meds ordered this encounter  Medications   levonorgestrel (MIRENA) 20 MCG/DAY IUD 1 each   clotrimazole-betamethasone (LOTRISONE) cream    Sig: Apply 1 Application topically 2 (two) times daily as needed for up to 7 days.    Dispense:  30 g    Refill:  0     IUD REMOVAL AND REPLACEMENT  Time out was performed.  A sterile speculum was placed  in the vagina.  The cervix was visualized, prepped using Betadine. The strings were visible. They were grasped and the Liletta IUD was easily removed. The cervix was then grasped with a single-tooth tenaculum. The uterus was found to be anteroflexed and it sounded to 8 cm.  Mirena  IUD placed per manufacturer's recommendations without complications. The strings were trimmed to approximately 3 cm.  The patient tolerated the procedure well.     Orders Placed This Encounter  Procedures   POCT urine pregnancy    Return in about 6 weeks (around 02/10/2022)  for IUD check.   Myna Hidalgo, DO Attending Obstetrician & Gynecologist, [facility] for [employer], [facility]

## 2022-01-05 LAB — CYTOLOGY - PAP
Chlamydia: NEGATIVE
Comment: NEGATIVE
Comment: NEGATIVE
Comment: NEGATIVE
Comment: NEGATIVE
Comment: NORMAL
Diagnosis: HIGH — AB
HPV 16: POSITIVE — AB
HPV 18 / 45: NEGATIVE
High risk HPV: POSITIVE — AB
Neisseria Gonorrhea: NEGATIVE

## 2022-01-07 ENCOUNTER — Ambulatory Visit: Payer: Medicaid Other | Admitting: Family Medicine

## 2022-01-15 ENCOUNTER — Ambulatory Visit (INDEPENDENT_AMBULATORY_CARE_PROVIDER_SITE_OTHER): Payer: Medicaid Other | Admitting: Obstetrics & Gynecology

## 2022-01-15 ENCOUNTER — Other Ambulatory Visit (HOSPITAL_COMMUNITY)
Admission: RE | Admit: 2022-01-15 | Discharge: 2022-01-15 | Disposition: A | Discharge status: Home / Self Care | Payer: Medicaid Other | Source: Ambulatory Visit | Attending: Obstetrics & Gynecology | Admitting: Obstetrics & Gynecology

## 2022-01-15 ENCOUNTER — Encounter: Payer: Self-pay | Admitting: Obstetrics & Gynecology

## 2022-01-15 VITALS — BP 142/86 | HR 82 | Ht 60.0 in | Wt 248.0 lb

## 2022-01-15 DIAGNOSIS — R87613 High grade squamous intraepithelial lesion on cytologic smear of cervix (HGSIL): Secondary | ICD-10-CM

## 2022-01-15 DIAGNOSIS — R87810 Cervical high risk human papillomavirus (HPV) DNA test positive: Secondary | ICD-10-CM | POA: Diagnosis not present

## 2022-01-15 DIAGNOSIS — Z975 Presence of (intrauterine) contraceptive device: Secondary | ICD-10-CM

## 2022-01-15 DIAGNOSIS — Z3202 Encounter for pregnancy test, result negative: Secondary | ICD-10-CM | POA: Diagnosis not present

## 2022-01-15 DIAGNOSIS — D069 Carcinoma in situ of cervix, unspecified: Secondary | ICD-10-CM | POA: Diagnosis not present

## 2022-01-15 LAB — POCT URINE PREGNANCY: Preg Test, Ur: NEGATIVE

## 2022-01-15 NOTE — Progress Notes (Addendum)
Patient name: Kimberly May MRN [id number]  Date of birth: 02-28-1991 Chief Complaint:   Colposcopy  History of Present Illness:   Kimberly May is a 31 y.o. [phone] female being seen today for cervical dysplasia management.  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 New sexual partner:  No.   History of abnormal Pap: no  Last visit Mirena placed- denies bleeding since that visit.  Denies irregular discharge, itching or irritation.  Denies pelvic or abdominal pain.  No other acute complaints or concerns   No LMP recorded. (Menstrual status: IUD).     03/05/2021   10:31 AM 11/24/2018    2:35 PM 04/26/2018    8:41 AM 07/30/2016    8:45 AM  Depression screen PHQ 2/9  Decreased Interest 0 0 0 0  Down, Depressed, Hopeless 0 0 0 0  PHQ - 2 Score 0 0 0 0  Altered sleeping  0    Tired, decreased energy  1    Change in appetite  0    Feeling bad or failure about yourself   0    Trouble concentrating  0    Moving slowly or fidgety/restless  0    Suicidal thoughts  0    PHQ-9 Score  1       Review of Systems:   Pertinent items are noted in HPI Denies fever/chills, dizziness, headaches, visual disturbances, fatigue, shortness of breath, chest pain, abdominal pain, vomiting, no problems with periods, bowel movements, urination, or intercourse unless otherwise stated above.  Pertinent History Reviewed:  Reviewed past medical,surgical, social, obstetrical and family history.  Reviewed problem list, medications and allergies. Physical Assessment:   Vitals:   01/15/22 1132  BP: (!) 142/86  Pulse: 82  Weight: 248 lb (112.5 kg)  Height: 5' (1.524 m)  Body mass index is 48.43 kg/m.       Physical Examination:   General appearance: alert, well appearing, and in no distress  Psych: mood appropriate, normal affect  Skin: warm & dry   Cardiovascular: normal heart rate noted  Respiratory: normal respiratory effort, no distress  Abdomen: obese soft, non-tender   Pelvic:  normal external genitalia, vulva, vagina, cervix- strings visualized, pt noted discomfort with placement of speculum  Extremities: no edema   Chaperone: Faith Rogue     Colposcopy Procedure Note  Indications: HSIL  Procedure Details  The risks and benefits of the procedure and Written informed consent obtained.  Speculum placed in vagina and visualization of cervix achieved, cervix swabbed x 3 with acetic acid solution  Findings: TMZ zone visualized- +acetowhite changes noted anteriorly.  Posterior portion difficult to visualize due to patient discomfort.    Irregular bleeding noted with palpation of cervix.  Long strings visualized at os.  Cervix: ECC and cervical biopsies obtained- 6 and 11 o'clock.  Moderate bleeding noted during exam- again making visualization difficult  Monsel's applied.  Adequate hemostasis noted  Specimens: ECC and cervical biopsies  Complications: none- appropriate bleeding  Colposcopic Impression: CIN 3   Plan(Based on 2019 ASCCP recommendations)  -Discussed HPV- reviewed incidence and its potential to cause condylomas to dysplasia to cervical cancer -Reviewed degree of abnormal pap smears  -Discussed ASCCP guidelines and recommendation to proceed with excision procedure -Colpo and biopsy completed today to r/o underlying carcinoma -As above, inform consent obtained and procedure completed -biopsies obtained, will schedule for laser ablation of the cervix at next available. -Questions and concerns were addressed -surgical referral created  IUD in place -  strings visualized- appear long, []  plan to trim in OR -discuss possible of shortening of string requiring surgical intervention   , DO Attending Obstetrician & Gynecologist, [facility] for Myna Hidalgo, [facility]

## 2022-01-16 ENCOUNTER — Encounter: Payer: Self-pay | Admitting: Obstetrics & Gynecology

## 2022-01-16 LAB — SURGICAL PATHOLOGY

## 2022-01-23 ENCOUNTER — Other Ambulatory Visit: Payer: Self-pay | Admitting: Obstetrics & Gynecology

## 2022-01-23 DIAGNOSIS — Z01818 Encounter for other preprocedural examination: Secondary | ICD-10-CM

## 2022-01-23 NOTE — Patient Instructions (Signed)
Kimberly May  01/23/2022     @PREFPERIOPPHARMACY @   Your procedure is scheduled on  01/28/2022.   Report to 01/30/2022 at  1205 PM.  P  Call this number if you have problems the morning of surgery:  [phone]   Remember:  Do not eat after midnight.    You may drink clear liquids until  1000 am on 01/28/2022.    Clear liquids allowed are:                    Water, Juice (No red color; non-citric and without pulp; diabetics please choose diet or no sugar options), Carbonated beverages (diabetics please choose diet or no sugar options), Clear Tea (No creamer, milk, or cream, including half & half and powdered creamer), Black Coffee Only (No creamer, milk or cream, including half & half and powdered creamer), Plain Jell-O Only (No red color; diabetics please choose no sugar options), Clear Sports drink (No red color; diabetics please choose diet or no sugar options), and Plain Popsicles Only (No red color; diabetics please choose no sugar options)           At 1000 am on 01/28/2022 drink your carb drink. You can have nothing else to drink after this.    Take these medicines the morning of surgery with A SIP OF WATER                                                        None     Do not wear jewelry, make-up or nail polish.  Do not wear lotions, powders, or perfumes, or deodorant.  Do not shave 48 hours prior to surgery.  Men may shave face and neck.  Do not bring valuables to the hospital.  [facility] is not responsible for any belongings or valuables.  Contacts, dentures or bridgework may not be worn into surgery.  Leave your suitcase in the car.  After surgery it may be brought to your room.  For patients admitted to the hospital, discharge time will be determined by your treatment team.  Patients discharged the day of surgery will not be allowed to drive home and must have someone with them for 24 hours.    Special instructions:   DO NOT smoke tobacco or  vape for 24 hours before your procedure.  Please read over the following fact sheets that you were given. Pain Booklet, Coughing and Deep Breathing, Surgical Site Infection Prevention, Anesthesia Post-op Instructions, and Care and Recovery After Surgery      Cervical Laser Surgery, Care After This sheet gives you information about how to care for yourself after your procedure. Your health care provider may also give you more specific instructions. If you have problems or questions, contact your health care provider. What can I expect after the procedure? After the procedure, it is common to have: Pain or discomfort. Mild cramping. Bleeding, spotting, or brownish discharge from your vagina. Follow these instructions at home: Activity  Rest as told by your health care provider. Do not lift anything that is heavier than 10 lb (4.5 kg), or the limit that you are told, until your health care provider says that it is safe. Do not have sex until your health care provider says it is  okay. Return to your normal activities as told by your health care provider. Ask your health care provider what activities are safe for you. General instructions Take over-the-counter and prescription medicines only as told by your health care provider. Ask your health care provider if the medicine prescribed to you requires you to avoid driving or using heavy machinery. Wear sanitary pads to absorb any bleeding, spotting, and discharge. Do not douche or put anything into your vagina, including tampons, until your health care provider says it is okay. It is up to you to get the results of your procedure. Ask your health care provider, or the department that is doing the procedure, when your results will be ready. Keep all follow-up visits as told by your health care provider. This is important. Contact a health care provider if: Your pain or cramping does not improve. Your periods are more painful than usual. You  do not get your period as expected. Get help right away if you have: Any symptoms of infection, such as: A fever. Chills. Discharge that smells bad. Severe pain in your abdomen. Heavy bleeding from your vagina (more than a normal period). Vaginal bleeding with clumps of blood (blood clots). Summary After this procedure, it is common to have pain or discomfort and mild cramping. It is also common to have bleeding, spotting, or brownish discharge from your vagina. Do not have sex, douche, use tampons, or put anything in your vagina until your health care provider says it is okay. Return to your normal activities as told by your health care provider. Ask your health care provider what activities are safe for you. Take over-the-counter and prescription medicines only as told by your health care provider. You may need to wear sanitary pads to absorb any bleeding, spotting, and discharge. This information is not intended to replace advice given to you by your health care provider. Make sure you discuss any questions you have with your health care provider. Document Revised: 10/10/2018 Document Reviewed: 10/10/2018 Elsevier Patient Education  2023 [employer] General Anesthesia, Adult, Care After The following information offers guidance on how to care for yourself after your procedure. Your health care provider may also give you more specific instructions. If you have problems or questions, contact your health care provider. What can I expect after the procedure? After the procedure, it is common for people to: Have pain or discomfort at the IV site. Have nausea or vomiting. Have a sore throat or hoarseness. Have trouble concentrating. Feel cold or chills. Feel weak, sleepy, or tired (fatigue). Have soreness and body aches. These can affect parts of the body that were not involved in surgery. Follow these instructions at home: For the time period you were told by your health care  provider:  Rest. Do not participate in activities where you could fall or become injured. Do not drive or use machinery. Do not drink alcohol. Do not take sleeping pills or medicines that cause drowsiness. Do not make important decisions or sign legal documents. Do not take care of children on your own. General instructions Drink enough fluid to keep your urine pale yellow. If you have sleep apnea, surgery and certain medicines can increase your risk for breathing problems. Follow instructions from your health care provider about wearing your sleep device: Anytime you are sleeping, including during daytime naps. While taking prescription pain medicines, sleeping medicines, or medicines that make you drowsy. Return to your normal activities as told by your health care provider. Ask your health  care provider what activities are safe for you. Take over-the-counter and prescription medicines only as told by your health care provider. Do not use any products that contain nicotine or tobacco. These products include cigarettes, chewing tobacco, and vaping devices, such as e-cigarettes. These can delay incision healing after surgery. If you need help quitting, ask your health care provider. Contact a health care provider if: You have nausea or vomiting that does not get better with medicine. You vomit every time you eat or drink. You have pain that does not get better with medicine. You cannot urinate or have bloody urine. You develop a skin rash. You have a fever. Get help right away if: You have trouble breathing. You have chest pain. You vomit blood. These symptoms may be an emergency. Get help right away. Call 911. Do not wait to see if the symptoms will go away. Do not drive yourself to the hospital. Summary After the procedure, it is common to have a sore throat, hoarseness, nausea, vomiting, or to feel weak, sleepy, or fatigue. For the time period you were told by your health care  provider, do not drive or use machinery. Get help right away if you have difficulty breathing, have chest pain, or vomit blood. These symptoms may be an emergency. This information is not intended to replace advice given to you by your health care provider. Make sure you discuss any questions you have with your health care provider. Document Revised: 07/25/2021 Document Reviewed: 07/25/2021 Elsevier Patient Education  2023 [employer] How to Use Chlorhexidine Before Surgery Chlorhexidine gluconate (CHG) is a germ-killing (antiseptic) solution that is used to clean the skin. It can get rid of the bacteria that normally live on the skin and can keep them away for about 24 hours. To clean your skin with CHG, you may be given: A CHG solution to use in the shower or as part of a sponge bath. A prepackaged cloth that contains CHG. Cleaning your skin with CHG may help lower the risk for infection: While you are staying in the intensive care unit of the hospital. If you have a vascular access, such as a central line, to provide short-term or long-term access to your veins. If you have a catheter to drain urine from your bladder. If you are on a ventilator. A ventilator is a machine that helps you breathe by moving air in and out of your lungs. After surgery. What are the risks? Risks of using CHG include: A skin reaction. Hearing loss, if CHG gets in your ears and you have a perforated eardrum. Eye injury, if CHG gets in your eyes and is not rinsed out. The CHG product catching fire. Make sure that you avoid smoking and flames after applying CHG to your skin. Do not use CHG: If you have a chlorhexidine allergy or have previously reacted to chlorhexidine. On babies younger than 6 months of age. How to use CHG solution Use CHG only as told by your health care provider, and follow the instructions on the label. Use the full amount of CHG as directed. Usually, this is one bottle. During a  shower Follow these steps when using CHG solution during a shower (unless your health care provider gives you different instructions): Start the shower. Use your normal soap and shampoo to wash your face and hair. Turn off the shower or move out of the shower stream. Pour the CHG onto a clean washcloth. Do not use any type of brush or rough-edged sponge.  Starting at your neck, lather your body down to your toes. Make sure you follow these instructions: If you will be having surgery, pay special attention to the part of your body where you will be having surgery. Scrub this area for at least 1 minute. Do not use CHG on your head or face. If the solution gets into your ears or eyes, rinse them well with water. Avoid your genital area. Avoid any areas of skin that have broken skin, cuts, or scrapes. Scrub your back and under your arms. Make sure to wash skin folds. Let the lather sit on your skin for 1-2 minutes or as long as told by your health care provider. Thoroughly rinse your entire body in the shower. Make sure that all body creases and crevices are rinsed well. Dry off with a clean towel. Do not put any substances on your body afterward--such as powder, lotion, or perfume--unless you are told to do so by your health care provider. Only use lotions that are recommended by the manufacturer. Put on clean clothes or pajamas. If it is the night before your surgery, sleep in clean sheets.  During a sponge bath Follow these steps when using CHG solution during a sponge bath (unless your health care provider gives you different instructions): Use your normal soap and shampoo to wash your face and hair. Pour the CHG onto a clean washcloth. Starting at your neck, lather your body down to your toes. Make sure you follow these instructions: If you will be having surgery, pay special attention to the part of your body where you will be having surgery. Scrub this area for at least 1 minute. Do not use  CHG on your head or face. If the solution gets into your ears or eyes, rinse them well with water. Avoid your genital area. Avoid any areas of skin that have broken skin, cuts, or scrapes. Scrub your back and under your arms. Make sure to wash skin folds. Let the lather sit on your skin for 1-2 minutes or as long as told by your health care provider. Using a different clean, wet washcloth, thoroughly rinse your entire body. Make sure that all body creases and crevices are rinsed well. Dry off with a clean towel. Do not put any substances on your body afterward--such as powder, lotion, or perfume--unless you are told to do so by your health care provider. Only use lotions that are recommended by the manufacturer. Put on clean clothes or pajamas. If it is the night before your surgery, sleep in clean sheets. How to use CHG prepackaged cloths Only use CHG cloths as told by your health care provider, and follow the instructions on the label. Use the CHG cloth on clean, dry skin. Do not use the CHG cloth on your head or face unless your health care provider tells you to. When washing with the CHG cloth: Avoid your genital area. Avoid any areas of skin that have broken skin, cuts, or scrapes. Before surgery Follow these steps when using a CHG cloth to clean before surgery (unless your health care provider gives you different instructions): Using the CHG cloth, vigorously scrub the part of your body where you will be having surgery. Scrub using a back-and-forth motion for 3 minutes. The area on your body should be completely wet with CHG when you are done scrubbing. Do not rinse. Discard the cloth and let the area air-dry. Do not put any substances on the area afterward, such as powder, lotion, or  perfume. Put on clean clothes or pajamas. If it is the night before your surgery, sleep in clean sheets.  For general bathing Follow these steps when using CHG cloths for general bathing (unless your  health care provider gives you different instructions). Use a separate CHG cloth for each area of your body. Make sure you wash between any folds of skin and between your fingers and toes. Wash your body in the following order, switching to a new cloth after each step: The front of your neck, shoulders, and chest. Both of your arms, under your arms, and your hands. Your stomach and groin area, avoiding the genitals. Your right leg and foot. Your left leg and foot. The back of your neck, your back, and your buttocks. Do not rinse. Discard the cloth and let the area air-dry. Do not put any substances on your body afterward--such as powder, lotion, or perfume--unless you are told to do so by your health care provider. Only use lotions that are recommended by the manufacturer. Put on clean clothes or pajamas. Contact a health care provider if: Your skin gets irritated after scrubbing. You have questions about using your solution or cloth. You swallow any chlorhexidine. Call your local poison control center (623-403-1937 in the [place] Get help right away if: Your eyes itch badly, or they become very red or swollen. Your skin itches badly and is red or swollen. Your hearing changes. You have trouble seeing. You have swelling or tingling in your mouth or throat. You have trouble breathing. These symptoms may represent a serious problem that is an emergency. Do not wait to see if the symptoms will go away. Get medical help right away. Call your local emergency services (911 in the [place] Do not drive yourself to the hospital. Summary Chlorhexidine gluconate (CHG) is a germ-killing (antiseptic) solution that is used to clean the skin. Cleaning your skin with CHG may help to lower your risk for infection. You may be given CHG to use for bathing. It may be in a bottle or in a prepackaged cloth to use on your skin. Carefully follow your health care provider's instructions and the instructions on the  product label. Do not use CHG if you have a chlorhexidine allergy. Contact your health care provider if your skin gets irritated after scrubbing. This information is not intended to replace advice given to you by your health care provider. Make sure you discuss any questions you have with your health care provider. Document Revised: 08/25/2021 Document Reviewed: 07/08/2020 Elsevier Patient Education  2023 [employer].

## 2022-01-26 ENCOUNTER — Encounter (HOSPITAL_COMMUNITY)
Admission: RE | Admit: 2022-01-26 | Discharge: 2022-01-26 | Disposition: A | Discharge status: Home / Self Care | Payer: Medicaid Other | Source: Ambulatory Visit | Attending: Obstetrics & Gynecology | Admitting: Obstetrics & Gynecology

## 2022-01-26 VITALS — BP 140/86 | HR 97 | Temp 97.7°F | Resp 18 | Ht 60.0 in | Wt 248.0 lb

## 2022-01-26 DIAGNOSIS — Z0000 Encounter for general adult medical examination without abnormal findings: Secondary | ICD-10-CM

## 2022-01-26 DIAGNOSIS — Z01812 Encounter for preprocedural laboratory examination: Secondary | ICD-10-CM | POA: Insufficient documentation

## 2022-01-26 DIAGNOSIS — Z01818 Encounter for other preprocedural examination: Secondary | ICD-10-CM

## 2022-01-26 LAB — URINALYSIS, ROUTINE W REFLEX MICROSCOPIC
Bilirubin Urine: NEGATIVE
Glucose, UA: NEGATIVE mg/dL
Ketones, ur: NEGATIVE mg/dL
Nitrite: NEGATIVE
Protein, ur: 30 mg/dL — AB
RBC / HPF: >50 RBC/hpf — ABNORMAL HIGH (ref 0–5)
Specific Gravity, Urine: 1.027 (ref 1.005–1.030)
WBC, UA: >50 WBC/hpf — ABNORMAL HIGH (ref 0–5)
pH: 5 (ref 5.0–8.0)

## 2022-01-26 LAB — CBC WITH DIFFERENTIAL/PLATELET
Abs Immature Granulocytes: 0.04 10*3/uL (ref 0.00–0.07)
Basophils Absolute: 0 10*3/uL (ref 0.0–0.1)
Basophils Relative: 0 %
Eosinophils Absolute: 0.2 10*3/uL (ref 0.0–0.5)
Eosinophils Relative: 1 %
HCT: 39 % (ref 36.0–46.0)
Hemoglobin: 13.1 g/dL (ref 12.0–15.0)
Immature Granulocytes: 0 %
Lymphocytes Relative: 23 %
Lymphs Abs: 2.7 10*3/uL (ref 0.7–4.0)
MCH: 28.2 pg (ref 26.0–34.0)
MCHC: 33.6 g/dL (ref 30.0–36.0)
MCV: 83.9 fL (ref 80.0–100.0)
Monocytes Absolute: 0.6 10*3/uL (ref 0.1–1.0)
Monocytes Relative: 5 %
Neutro Abs: 8.3 10*3/uL — ABNORMAL HIGH (ref 1.7–7.7)
Neutrophils Relative %: 71 %
Platelets: 252 10*3/uL (ref 150–400)
RBC: 4.65 MIL/uL (ref 3.87–5.11)
RDW: 13.2 % (ref 11.5–15.5)
WBC: 11.7 10*3/uL — ABNORMAL HIGH (ref 4.0–10.5)
nRBC: 0 % (ref 0.0–0.2)

## 2022-01-26 LAB — RAPID HIV SCREEN (HIV 1/2 AB+AG)
HIV 1/2 Antibodies: NONREACTIVE
HIV-1 P24 Antigen - HIV24: NONREACTIVE

## 2022-01-26 LAB — COMPREHENSIVE METABOLIC PANEL
ALT: 23 U/L (ref 0–44)
AST: 18 U/L (ref 15–41)
Albumin: 3.6 g/dL (ref 3.5–5.0)
Alkaline Phosphatase: 65 U/L (ref 38–126)
Anion gap: 7 (ref 5–15)
BUN: 10 mg/dL (ref 6–20)
CO2: 25 mmol/L (ref 22–32)
Calcium: 8.7 mg/dL — ABNORMAL LOW (ref 8.9–10.3)
Chloride: 104 mmol/L (ref 98–111)
Creatinine, Ser: 0.54 mg/dL (ref 0.44–1.00)
GFR, Estimated: >60 mL/min (ref >60)
Glucose, Bld: 140 mg/dL — ABNORMAL HIGH (ref 70–99)
Potassium: 3.5 mmol/L (ref 3.5–5.1)
Sodium: 136 mmol/L (ref 135–145)
Total Bilirubin: 0.5 mg/dL (ref 0.3–1.2)
Total Protein: 7.8 g/dL (ref 6.5–8.1)

## 2022-01-26 LAB — HCG, SERUM, QUALITATIVE: Preg, Serum: NEGATIVE

## 2022-01-28 ENCOUNTER — Encounter (HOSPITAL_COMMUNITY): Payer: Self-pay | Admitting: Obstetrics & Gynecology

## 2022-01-28 ENCOUNTER — Encounter (HOSPITAL_COMMUNITY)
Admission: RE | Disposition: A | Discharge status: Home / Self Care | Payer: Self-pay | Source: Home / Self Care | Attending: Obstetrics & Gynecology

## 2022-01-28 ENCOUNTER — Ambulatory Visit (HOSPITAL_BASED_OUTPATIENT_CLINIC_OR_DEPARTMENT_OTHER): Payer: Medicaid Other | Admitting: Certified Registered Nurse Anesthetist

## 2022-01-28 ENCOUNTER — Ambulatory Visit (HOSPITAL_COMMUNITY): Payer: Medicaid Other | Admitting: Certified Registered Nurse Anesthetist

## 2022-01-28 ENCOUNTER — Ambulatory Visit (HOSPITAL_COMMUNITY)
Admission: RE | Admit: 2022-01-28 | Discharge: 2022-01-28 | Disposition: A | Discharge status: Home / Self Care | Payer: Medicaid Other | Attending: Obstetrics & Gynecology | Admitting: Obstetrics & Gynecology

## 2022-01-28 DIAGNOSIS — E119 Type 2 diabetes mellitus without complications: Secondary | ICD-10-CM | POA: Diagnosis not present

## 2022-01-28 DIAGNOSIS — R87613 High grade squamous intraepithelial lesion on cytologic smear of cervix (HGSIL): Secondary | ICD-10-CM | POA: Diagnosis not present

## 2022-01-28 DIAGNOSIS — Z6841 Body Mass Index (BMI) 40.0 and over, adult: Secondary | ICD-10-CM | POA: Diagnosis not present

## 2022-01-28 DIAGNOSIS — Z975 Presence of (intrauterine) contraceptive device: Secondary | ICD-10-CM | POA: Diagnosis not present

## 2022-01-28 DIAGNOSIS — D069 Carcinoma in situ of cervix, unspecified: Secondary | ICD-10-CM | POA: Diagnosis not present

## 2022-01-28 HISTORY — PX: CERVICAL ABLATION: SHX5771

## 2022-01-28 SURGERY — ABLATION, CERVIX
Anesthesia: General | Site: Vagina

## 2022-01-28 MED ORDER — CHLORHEXIDINE GLUCONATE 0.12 % MT SOLN
15.0000 mL | Freq: Once | OROMUCOSAL | Status: AC
  Administered 2022-01-28: 15 mL via OROMUCOSAL

## 2022-01-28 MED ORDER — ONDANSETRON 8 MG PO TBDP: 8.0000 mg | ORAL_TABLET | Freq: Three times a day (TID) | ORAL | 0 refills | Status: DC | PRN

## 2022-01-28 MED ORDER — PROPOFOL 10 MG/ML IV BOLUS
INTRAVENOUS | Status: DC | PRN
  Administered 2022-01-28: 200 mg via INTRAVENOUS

## 2022-01-28 MED ORDER — ONDANSETRON HCL 4 MG/2ML IJ SOLN
INTRAMUSCULAR | Status: DC | PRN
  Administered 2022-01-28: 4 mg via INTRAVENOUS

## 2022-01-28 MED ORDER — PROPOFOL 10 MG/ML IV BOLUS
INTRAVENOUS | Status: AC
  Filled 2022-01-28: qty 20

## 2022-01-28 MED ORDER — DEXAMETHASONE SODIUM PHOSPHATE 10 MG/ML IJ SOLN
INTRAMUSCULAR | Status: DC | PRN
  Administered 2022-01-28: 10 mg via INTRAVENOUS

## 2022-01-28 MED ORDER — FERRIC SUBSULFATE 259 MG/GM EX SOLN
CUTANEOUS | Status: AC
  Filled 2022-01-28: qty 8

## 2022-01-28 MED ORDER — WATER FOR IRRIGATION, STERILE IR SOLN
Status: DC | PRN
  Administered 2022-01-28: 1000 mL

## 2022-01-28 MED ORDER — POVIDONE-IODINE 10 % EX SWAB: 2.0000 | Freq: Once | CUTANEOUS | Status: DC

## 2022-01-28 MED ORDER — KETOROLAC TROMETHAMINE 30 MG/ML IJ SOLN
30.0000 mg | Freq: Once | INTRAMUSCULAR | Status: AC
  Administered 2022-01-28: 30 mg via INTRAVENOUS
  Filled 2022-01-28: qty 1

## 2022-01-28 MED ORDER — MIDAZOLAM HCL 2 MG/2ML IJ SOLN
INTRAMUSCULAR | Status: AC
  Filled 2022-01-28: qty 2

## 2022-01-28 MED ORDER — ONDANSETRON HCL 4 MG/2ML IJ SOLN: 4.0000 mg | Freq: Once | INTRAMUSCULAR | Status: DC | PRN

## 2022-01-28 MED ORDER — MIDAZOLAM HCL 2 MG/2ML IJ SOLN
INTRAMUSCULAR | Status: DC | PRN
  Administered 2022-01-28: 2 mg via INTRAVENOUS

## 2022-01-28 MED ORDER — ACETIC ACID 5 % SOLN
Status: DC | PRN
  Administered 2022-01-28: 1 via TOPICAL

## 2022-01-28 MED ORDER — FENTANYL CITRATE PF 50 MCG/ML IJ SOSY: 25.0000 ug | PREFILLED_SYRINGE | INTRAMUSCULAR | Status: DC | PRN

## 2022-01-28 MED ORDER — CEFAZOLIN SODIUM-DEXTROSE 2-4 GM/100ML-% IV SOLN
2.0000 g | INTRAVENOUS | Status: AC
  Administered 2022-01-28: 2 g via INTRAVENOUS
  Filled 2022-01-28: qty 100

## 2022-01-28 MED ORDER — OXYCODONE HCL 5 MG PO TABS: 5.0000 mg | ORAL_TABLET | Freq: Once | ORAL | Status: DC | PRN

## 2022-01-28 MED ORDER — FERRIC SUBSULFATE 259 MG/GM EX SOLN
CUTANEOUS | Status: DC | PRN
  Administered 2022-01-28: 1

## 2022-01-28 MED ORDER — LIDOCAINE HCL (CARDIAC) PF 100 MG/5ML IV SOSY
PREFILLED_SYRINGE | INTRAVENOUS | Status: DC | PRN
  Administered 2022-01-28: 60 mg via INTRAVENOUS

## 2022-01-28 MED ORDER — LACTATED RINGERS IV SOLN: INTRAVENOUS | Status: DC

## 2022-01-28 MED ORDER — FENTANYL CITRATE (PF) 250 MCG/5ML IJ SOLN
INTRAMUSCULAR | Status: DC | PRN
  Administered 2022-01-28 (×2): 50 ug via INTRAVENOUS

## 2022-01-28 MED ORDER — ORAL CARE MOUTH RINSE: 15.0000 mL | Freq: Once | OROMUCOSAL | Status: AC

## 2022-01-28 MED ORDER — OXYCODONE HCL 5 MG/5ML PO SOLN: 5.0000 mg | Freq: Once | ORAL | Status: DC | PRN

## 2022-01-28 MED ORDER — FENTANYL CITRATE (PF) 100 MCG/2ML IJ SOLN
INTRAMUSCULAR | Status: AC
  Filled 2022-01-28: qty 2

## 2022-01-28 MED ORDER — KETOROLAC TROMETHAMINE 10 MG PO TABS: 10.0000 mg | ORAL_TABLET | Freq: Three times a day (TID) | ORAL | 0 refills | Status: DC | PRN

## 2022-01-28 SURGICAL SUPPLY — 27 items
APL FBRTP 16 NS LF PRCTSCP (MISCELLANEOUS) ×1
BAG HAMPER (MISCELLANEOUS) ×2 IMPLANT
CLOTH BEACON ORANGE TIMEOUT ST (SAFETY) ×2 IMPLANT
COVER LIGHT HANDLE STERIS (MISCELLANEOUS) ×4 IMPLANT
COVER MAYO STAND XLG (MISCELLANEOUS) ×2 IMPLANT
GAUZE 4X4 16PLY ~~LOC~~+RFID DBL (SPONGE) ×2 IMPLANT
GLOVE BIOGEL PI IND STRL 7.0 (GLOVE) ×4 IMPLANT
GLOVE BIOGEL PI IND STRL 8 (GLOVE) ×2 IMPLANT
GLOVE SRG 8 PF TXTR STRL LF DI (GLOVE) ×2 IMPLANT
GLOVE SURG UNDER POLY LF SZ8 (GLOVE) ×1
GOWN STRL REUS W/TWL LRG LVL3 (GOWN DISPOSABLE) ×2 IMPLANT
GOWN STRL REUS W/TWL XL LVL3 (GOWN DISPOSABLE) ×2 IMPLANT
KIT TURNOVER KIT A (KITS) ×2 IMPLANT
LASER FIBER 1000M SMARTSCOPE (Laser) ×2 IMPLANT
MANIFOLD NEPTUNE II (INSTRUMENTS) ×2 IMPLANT
MARKER SKIN DUAL TIP RULER LAB (MISCELLANEOUS) ×2 IMPLANT
PACK BASIC III (CUSTOM PROCEDURE TRAY) ×1
PACK SRG BSC III STRL LF ECLPS (CUSTOM PROCEDURE TRAY) ×2 IMPLANT
PAD ARMBOARD 7.5X6 YLW CONV (MISCELLANEOUS) ×2 IMPLANT
SCOPETTES 8  STERILE (MISCELLANEOUS) ×1
SCOPETTES 8 STERILE (MISCELLANEOUS) IMPLANT
SET BASIN LINEN APH (SET/KITS/TRAYS/PACK) ×2 IMPLANT
SHEET LAVH (DRAPES) ×2 IMPLANT
SWAB PROCTOSCOPIC (MISCELLANEOUS) ×2 IMPLANT
TOWEL OR 17X26 4PK STRL BLUE (TOWEL DISPOSABLE) ×2 IMPLANT
TUBING SMOKE EVAC CO2 (TUBING) ×2 IMPLANT
WATER STERILE IRR 1000ML POUR (IV SOLUTION) ×2 IMPLANT

## 2022-01-28 NOTE — Transfer of Care (Signed)
Immediate Anesthesia Transfer of Care Note  Patient: Kimberly May  Procedure(s) Performed: CERVICAL ABLATION (Vagina )  Patient Location: PACU  Anesthesia Type:General  Level of Consciousness: awake, alert  and oriented  Airway & Oxygen Therapy: Patient Spontanous Breathing and Patient connected to nasal cannula oxygen  Post-op Assessment: Report given to RN and Post -op Vital signs reviewed and stable  Post vital signs: Reviewed and stable  Last Vitals:  Vitals Value Taken Time  BP 119/81   Temp 98   Pulse 95   Resp 18   SpO2 98%     Last Pain:  Vitals:   01/28/22 1143  TempSrc: Oral  PainSc: 0-No pain         Complications: No notable events documented.

## 2022-01-28 NOTE — Anesthesia Preprocedure Evaluation (Addendum)
Anesthesia Evaluation  Patient identified by MRN, date of birth, ID band Patient awake    Reviewed: Allergy & Precautions, H&P , NPO status , Patient's Chart, lab work & pertinent test results, reviewed documented beta blocker date and time   Airway Mallampati: II  TM Distance: >3 FB Neck ROM: full    Dental no notable dental hx.    Pulmonary neg pulmonary ROS,    Pulmonary exam normal breath sounds clear to auscultation       Cardiovascular Exercise Tolerance: Good negative cardio ROS   Rhythm:regular Rate:Normal     Neuro/Psych negative neurological ROS  negative psych ROS   GI/Hepatic negative GI ROS, Neg liver ROS,   Endo/Other  diabetesMorbid obesity  Renal/GU negative Renal ROS  negative genitourinary   Musculoskeletal   Abdominal   Peds  Hematology negative hematology ROS (+)   Anesthesia Other Findings   Reproductive/Obstetrics negative OB ROS                             Anesthesia Physical Anesthesia Plan  ASA: 3  Anesthesia Plan: General   Post-op Pain Management:    Induction:   PONV Risk Score and Plan: Ondansetron  Airway Management Planned:   Additional Equipment:   Intra-op Plan:   Post-operative Plan:   Informed Consent: I have reviewed the patients History and Physical, chart, labs and discussed the procedure including the risks, benefits and alternatives for the proposed anesthesia with the patient or authorized representative who has indicated his/her understanding and acceptance.     Dental Advisory Given  Plan Discussed with: CRNA  Anesthesia Plan Comments:        Anesthesia Quick Evaluation

## 2022-01-28 NOTE — Anesthesia Procedure Notes (Signed)
Procedure Name: LMA Insertion Date/Time: 01/28/2022 1:24 PM  Performed by: Karna Dupes, CRNAPre-anesthesia Checklist: Emergency Drugs available, Patient identified, Suction available and Patient being monitored Patient Re-evaluated:Patient Re-evaluated prior to induction Oxygen Delivery Method: Circle system utilized Preoxygenation: Pre-oxygenation with 100% oxygen Induction Type: IV induction LMA: LMA inserted LMA Size: 4.0 Number of attempts: 1 Placement Confirmation: positive ETCO2 and breath sounds checked- equal and bilateral Tube secured with: Tape Dental Injury: Teeth and Oropharynx as per pre-operative assessment

## 2022-01-28 NOTE — H&P (Signed)
Preoperative History and Physical  Kimberly May is a 31 y.o. [id number] with No LMP recorded. (Menstrual status: IUD). 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    PMH:    Past Medical History:  Diagnosis Date   Acute respiratory failure (HCC) 06/10/2013   Chlamydia 08/03/2016   Treated 3/26 pt and partner Augusto Gamble, 07/10/97.POC 4/23 at 4 pm, no sex and NCCDRC sent   Gestational diabetes mellitus (GDM) affecting second pregnancy 05/25/2019   History of cesarean section 09/29/2018   For NRFHR   Wants TOLAC, consent signed 03/08/2019-JVF   Septic shock (HCC) 06/10/2013   Tuberculosis 15 YRS AGO    FATHER HAD TB , PT WAS MED [place]    PSH:     Past Surgical History:  Procedure Laterality Date   ADENOIDECTOMY     CESAREAN SECTION  2009    CESAREAN SECTION N/A 05/26/2019   Procedure: CESAREAN SECTION;  Surgeon: Catalina Antigua, MD;  Location: [facility]S;  Service: Obstetrics;  Laterality: N/A;   DIRECT LARYNGOSCOPY N/A 06/14/2013   Procedure: DIRECT LARYNGOSCOPY/RE-EXPLORATORY OF NECK;  Surgeon: Flo Shanks, MD;  Location: [facility] OR;  Service: ENT;  Laterality: N/A;   INCISION AND DRAINAGE ABSCESS N/A 06/04/2013   Procedure: INCISION AND DRAINAGE PHARYNGEAL ABSCESS;  Surgeon: Melvenia Beam, MD;  Location: [facility] OR;  Service: ENT;  Laterality: N/A;   INCISION AND DRAINAGE OF PERITONSILLAR ABCESS Left 06/09/2013   Procedure: INCISION AND DRAINAGE OF left retropharyneal abscess;  Surgeon: Flo Shanks, MD;  Location: [facility] OR;  Service: ENT;  Laterality: Left;   TONSILLECTOMY  04/10/2011   Procedure: TONSILLECTOMY;  Surgeon: Antony Contras;  Location: [facility] OR;  Service: ENT;  Laterality: Bilateral;   TONSILLECTOMY     TRACHEOSTOMY TUBE PLACEMENT N/A 06/14/2013   Procedure: TRACHEOSTOMY;  Surgeon: Flo Shanks, MD;  Location: [facility] OR;  Service: ENT;  Laterality: N/A;   TYMPANOSTOMY TUBE PLACEMENT  DONE TWICE    [facility]AT    ([facility])    POb/GynH:      OB History     Gravida  3   Para  2   Term  2   Preterm  0   AB  1   Living  2      SAB  1   IAB  0   Ectopic  0   Multiple  0   Live Births  2           SH:   Social History   Tobacco Use   Smoking status: Never   Smokeless tobacco: Never  Vaping Use   Vaping Use: Never used  Substance Use Topics   Alcohol use: No   Drug use: No    FH:    Family History  Problem Relation Age of Onset   Cancer Father        larynx   Pneumonia Father    Other Paternal Grandfather        colon tumor   Thyroid disease Paternal Grandmother    Cancer Maternal Grandmother        hodgkins lymphoma, cervical   Heart disease Maternal Grandmother    Asthma Son    ADD / ADHD Son      Allergies:  Allergies  Allergen Reactions   Latex Hives   Penicillins Rash    Has patient had a PCN reaction causing immediate rash, facial/tongue/throat swelling, SOB or lightheadedness  with hypotension: No Has patient had a PCN reaction causing severe rash involving mucus membranes or skin necrosis: Yes Has patient had a PCN reaction that required hospitalization No Has patient had a PCN reaction occurring within the last 10 years: Yes If all of the above answers are "NO", then may proceed with Cephalosporin use.    Medications:       Current Facility-Administered Medications:    ceFAZolin (ANCEF) IVPB 2g/100 mL premix, 2 g, Intravenous, On Call to OR, Florian Buff, MD   povidone-iodine 10 % swab 2 Application, 2 Application, Topical, Once, Florian Buff, MD  Review of Systems:   Review of Systems  Constitutional: Negative for fever, chills, weight loss, malaise/fatigue and diaphoresis.  HENT: Negative for hearing loss, ear pain, nosebleeds, congestion, sore throat, neck pain, tinnitus and ear discharge.   Eyes: Negative for blurred vision, double vision, photophobia, pain, discharge and redness.  Respiratory: Negative for cough, hemoptysis, sputum  production, shortness of breath, wheezing and stridor.   Cardiovascular: Negative for chest pain, palpitations, orthopnea, claudication, leg swelling and PND.  Gastrointestinal: Positive for abdominal pain. Negative for heartburn, nausea, vomiting, diarrhea, constipation, blood in stool and melena.  Genitourinary: Negative for dysuria, urgency, frequency, hematuria and flank pain.  Musculoskeletal: Negative for myalgias, back pain, joint pain and falls.  Skin: Negative for itching and rash.  Neurological: Negative for dizziness, tingling, tremors, sensory change, speech change, focal weakness, seizures, loss of consciousness, weakness and headaches.  Endo/Heme/Allergies: Negative for environmental allergies and polydipsia. Does not bruise/bleed easily.  Psychiatric/Behavioral: Negative for depression, suicidal ideas, hallucinations, memory loss and substance abuse. The patient is not nervous/anxious and does not have insomnia.      PHYSICAL EXAM:  Blood pressure (!) 126/90, pulse 88, temperature 98 F (36.7 C), temperature source Oral, resp. rate 18, SpO2 97 %.    Vitals reviewed. Constitutional: She is oriented to person, place, and time. She appears well-developed and well-nourished.  HENT:  Head: Normocephalic and atraumatic.  Right Ear: External ear normal.  Left Ear: External ear normal.  Nose: Nose normal.  Mouth/Throat: Oropharynx is clear and moist.  Eyes: Conjunctivae and EOM are normal. Pupils are equal, round, and reactive to light. Right eye exhibits no discharge. Left eye exhibits no discharge. No scleral icterus.  Neck: Normal range of motion. Neck supple. No tracheal deviation present. No thyromegaly present.  Cardiovascular: Normal rate, regular rhythm, normal heart sounds and intact distal pulses.  Exam reveals no gallop and no friction rub.   No murmur heard. Respiratory: Effort normal and breath sounds normal. No respiratory distress. She has no wheezes. She has no  rales. She exhibits no tenderness.  GI: Soft. Bowel sounds are normal. She exhibits no distension and no mass. There is tenderness. There is no rebound and no guarding.  Genitourinary:       Vulva is normal without lesions Vagina is pink moist without discharge Cervix per colpo note Uterus is normal size, contour, position, consistency, mobility, non-tender Adnexa is negative with normal sized ovaries by sonogram  Musculoskeletal: Normal range of motion. She exhibits no edema and no tenderness.  Neurological: She is alert and oriented to person, place, and time. She has normal reflexes. She displays normal reflexes. No cranial nerve deficit. She exhibits normal muscle tone. Coordination normal.  Skin: Skin is warm and dry. No rash noted. No erythema. No pallor.  Psychiatric: She has a normal mood and affect. Her behavior is normal. Judgment and thought content normal.  Labs: Results for orders placed or performed during the hospital encounter of 01/26/22 (from the past 336 hour(s))  CBC with Differential/Platelet   Collection Time: 01/26/22  3:24 PM  Result Value Ref Range   WBC 11.7 (H) 4.0 - 10.5 K/uL   RBC 4.65 3.87 - 5.11 MIL/uL   Hemoglobin 13.1 12.0 - 15.0 g/dL   HCT 29.7 98.9 - 21.1 %   MCV 83.9 80.0 - 100.0 fL   MCH 28.2 26.0 - 34.0 pg   MCHC 33.6 30.0 - 36.0 g/dL   RDW 94.1 74.0 - 81.4 %   Platelets 252 150 - 400 K/uL   nRBC 0.0 0.0 - 0.2 %   Neutrophils Relative % 71 %   Neutro Abs 8.3 (H) 1.7 - 7.7 K/uL   Lymphocytes Relative 23 %   Lymphs Abs 2.7 0.7 - 4.0 K/uL   Monocytes Relative 5 %   Monocytes Absolute 0.6 0.1 - 1.0 K/uL   Eosinophils Relative 1 %   Eosinophils Absolute 0.2 0.0 - 0.5 K/uL   Basophils Relative 0 %   Basophils Absolute 0.0 0.0 - 0.1 K/uL   Immature Granulocytes 0 %   Abs Immature Granulocytes 0.04 0.00 - 0.07 K/uL  Comprehensive metabolic panel   Collection Time: 01/26/22  3:24 PM  Result Value Ref Range   Sodium 136 135 - 145 mmol/L    Potassium 3.5 3.5 - 5.1 mmol/L   Chloride 104 98 - 111 mmol/L   CO2 25 22 - 32 mmol/L   Glucose, Bld 140 (H) 70 - 99 mg/dL   BUN 10 6 - 20 mg/dL   Creatinine, Ser 4.81 0.44 - 1.00 mg/dL   Calcium 8.7 (L) 8.9 - 10.3 mg/dL   Total Protein 7.8 6.5 - 8.1 g/dL   Albumin 3.6 3.5 - 5.0 g/dL   AST 18 15 - 41 U/L   ALT 23 0 - 44 U/L   Alkaline Phosphatase 65 38 - 126 U/L   Total Bilirubin 0.5 0.3 - 1.2 mg/dL   GFR, Estimated >85 >63 mL/min   Anion gap 7 5 - 15  Rapid HIV screen (HIV 1/2 Ab+Ag)   Collection Time: 01/26/22  3:24 PM  Result Value Ref Range   HIV-1 P24 Antigen - HIV24 NON REACTIVE NON REACTIVE   HIV 1/2 Antibodies NON REACTIVE NON REACTIVE   Interpretation (HIV Ag Ab)      A non reactive test result means that HIV 1 or HIV 2 antibodies and HIV 1 p24 antigen were not detected in the specimen.  Urinalysis, Routine w reflex microscopic Urine, Clean Catch   Collection Time: 01/26/22  3:24 PM  Result Value Ref Range   Color, Urine YELLOW YELLOW   APPearance HAZY (A) CLEAR   Specific Gravity, Urine 1.027 1.005 - 1.030   pH 5.0 5.0 - 8.0   Glucose, UA NEGATIVE NEGATIVE mg/dL   Hgb urine dipstick LARGE (A) NEGATIVE   Bilirubin Urine NEGATIVE NEGATIVE   Ketones, ur NEGATIVE NEGATIVE mg/dL   Protein, ur 30 (A) NEGATIVE mg/dL   Nitrite NEGATIVE NEGATIVE   Leukocytes,Ua LARGE (A) NEGATIVE   RBC / HPF >50 (H) 0 - 5 RBC/hpf   WBC, UA >50 (H) 0 - 5 WBC/hpf   Bacteria, UA RARE (A) NONE SEEN   Squamous Epithelial / LPF 6-10 0 - 5   Mucus PRESENT   hCG, serum, qualitative   Collection Time: 01/26/22  3:30 PM  Result Value Ref Range   Preg, Serum NEGATIVE NEGATIVE  Results for orders placed or performed in visit on 01/15/22 (from the past 336 hour(s))  POCT urine pregnancy   Collection Time: 01/15/22 11:43 AM  Result Value Ref Range   Preg Test, Ur Negative Negative  Surgical pathology( [facility]/ [facility])   Collection Time: 01/15/22 11:59 AM  Result Value Ref Range    SURGICAL PATHOLOGY      SURGICAL PATHOLOGY CASE: [id number] PATIENT: [place]ORDAN Kuhnert Surgical Pathology Report     Clinical History: HGSIL, 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  Definite evidence of invasion is not seen but it cannot be ruled out.    GROSS DESCRIPTION:  Specimen A: Received in formalin is blood tinged mucus that is entirely submitted in one block.  Volume: 1.3 x 1.1 x 0.2 cm.  Specimen B: Received in formalin is a pink-gray soft tissue fragment that is submitted in toto.  Size: 0.15 cm, 1 block submitted.  Specimen C: Received in formalin is a pink-gray soft tissue fragme nt that is submitted in toto.  Size: 0.7 cm, 1 block submitted.  SW 01/15/2022   Final Diagnosis performed by Holley BoucheNilesh Kashikar, MD.   Electronically signed 01/16/2022 Technical component performed at [facility], [address], [place], [place] [address].  Professional component performed at [facility], [address] [place], [place] [address].  Immunohistochemistry Technical component (if applicable) was performed at [facility]. [address], [address], [place], [place] [address].   IMMUNOHISTOCHEMISTRY DISCLAIMER (if applicable): Some of these immunohistochemical stains may have been developed and the performance characteristics determine by [facility]. Some may not have been cleared or approved by the U.S. Food and Drug Administration. The FDA has determined that such clearance or approval is not necessary. This test  is used for clinical purposes. It should not be regarded as investigational  or for research. This laboratory is certified under the Clinical Laboratory Improvement Amendments of 1988 (CLIA-88) as qualified to perform high complexity clinical laboratory testing.  The controls stained appropriately.     EKG: Orders placed or performed during the hospital encounter of 08/14/16   EKG 12-Lead   EKG 12-Lead   EKG 12-Lead   EKG 12-Lead   EKG    Imaging Studies: No results found.    Assessment: HSIL of the cervix  Plan: Laser ablation of the cervix  Lazaro ArmsLuther H Aryana Wonnacott 01/28/2022 12:49 PM

## 2022-01-28 NOTE — Op Note (Signed)
Preoperative Diagnosis:  High Grade Squamous Intraepithelial lesion, adequate colposcopy  Postoperative Diagnosis:  Same as above  Procedure:  Laser ablation of the cervix  Surgeon:  Florian Buff MD  Anaesthesia:  Laryngeal Mask Airway  Findings:  Patient had an abnormal pap smear which was evaluated in the office with colposcopy and directed biopsies.  Pathology report returned as high Grade SIL.  The colposcopy was adequate.  As a result, the patient is admitted for laser ablation of the cervix.  Description of Note:  Patient was taken to the OR and placed in the supine position where she underwent laryngeal mask airway anaesthesia.  She was placed in the dorsal lithotomy position.  She was draped for laser.  Graves speculum was placed and 3% acetic acid used and the laser microscope employed to perform colposcopy which confirmed the office findings.  Laser was used on typical cervical settings and used to vaporized the squamocolumnar junction to  depth of  5-7 mm peripherally and 7-9 mm centrally.  Surgical margin of several mm was employed beyond the acetowhite epithelium.  Hemostasis was achieved with the laser and Monsel's solution.  Patient was awakened from anaesthesia in good stable condition and all counts were correct.  She received Ancef 2 gram and Toradol 30 mg IV preoperatively prophylactically.  Florian Buff, MD 01/28/2022 1:44 PM

## 2022-01-29 NOTE — Anesthesia Postprocedure Evaluation (Signed)
Anesthesia Post Note  Patient: Kimberly May  Procedure(s) Performed: CERVICAL ABLATION (Vagina )  Patient location during evaluation: Phase II Anesthesia Type: General Level of consciousness: awake Pain management: pain level controlled Vital Signs Assessment: post-procedure vital signs reviewed and stable Respiratory status: spontaneous breathing and respiratory function stable Cardiovascular status: blood pressure returned to baseline and stable Postop Assessment: no headache and no apparent nausea or vomiting Anesthetic complications: no Comments: Late entry   No notable events documented.   Last Vitals:  Vitals:   01/28/22 1415 01/28/22 1429  BP: 102/61 131/81  Pulse: 87 76  Resp: 14 16  Temp:  36.7 C  SpO2: 95% 97%    Last Pain:  Vitals:   01/28/22 1429  TempSrc: Oral  PainSc: 0-No pain                 Louann Sjogren

## 2022-02-03 ENCOUNTER — Ambulatory Visit: Payer: Medicaid Other | Admitting: Nurse Practitioner

## 2022-02-03 ENCOUNTER — Encounter: Payer: Self-pay | Admitting: Internal Medicine

## 2022-02-03 ENCOUNTER — Ambulatory Visit: Payer: Medicaid Other | Admitting: Internal Medicine

## 2022-02-05 ENCOUNTER — Encounter (HOSPITAL_COMMUNITY): Payer: Self-pay | Admitting: Obstetrics & Gynecology

## 2022-02-07 ENCOUNTER — Encounter: Payer: Self-pay | Admitting: Obstetrics & Gynecology

## 2022-02-11 ENCOUNTER — Ambulatory Visit: Payer: Medicaid Other | Admitting: Obstetrics & Gynecology

## 2022-02-19 ENCOUNTER — Encounter: Payer: Self-pay | Admitting: Obstetrics & Gynecology

## 2022-02-19 ENCOUNTER — Ambulatory Visit: Payer: Medicaid Other | Admitting: Obstetrics & Gynecology

## 2022-02-19 VITALS — BP 132/95 | HR 77 | Ht 60.0 in | Wt 252.0 lb

## 2022-02-19 DIAGNOSIS — Z9889 Other specified postprocedural states: Secondary | ICD-10-CM | POA: Diagnosis not present

## 2022-02-19 DIAGNOSIS — D069 Carcinoma in situ of cervix, unspecified: Secondary | ICD-10-CM | POA: Diagnosis not present

## 2022-02-19 NOTE — Progress Notes (Signed)
   GYN VISIT Patient name: Kimberly May MRN [id number]  Date of birth: 08/15/1990 Chief Complaint:   Follow-up (IUD check )  History of Present Illness:   Kimberly Heckert is a 31 y.o. [phone] female being seen today for the following concerns:  -s/p cervical ablation on 9/20 Initially noted some odorous discharge, which has now resolved.  Also noted IUD string, device still in place.  Denies vaginal bleeding.  Denies pelvic or abdominal pain.  No acute complaints.  No LMP recorded. (Menstrual status: IUD).     03/05/2021   10:31 AM 11/24/2018    2:35 PM 04/26/2018    8:41 AM 07/30/2016    8:45 AM  Depression screen PHQ 2/9  Decreased Interest 0 0 0 0  Down, Depressed, Hopeless 0 0 0 0  PHQ - 2 Score 0 0 0 0  Altered sleeping  0    Tired, decreased energy  1    Change in appetite  0    Feeling bad or failure about yourself   0    Trouble concentrating  0    Moving slowly or fidgety/restless  0    Suicidal thoughts  0    PHQ-9 Score  1       Review of Systems:   Pertinent items are noted in HPI Denies fever/chills, dizziness, headaches, visual disturbances, fatigue, shortness of breath, chest pain, abdominal pain, vomiting, no problems with periods, bowel movements, urination, or intercourse unless otherwise stated above.  Pertinent History Reviewed:  Reviewed past medical,surgical, social, obstetrical and family history.  Reviewed problem list, medications and allergies. Physical Assessment:   Vitals:   02/19/22 1004  BP: (!) 132/95  Pulse: 77  Weight: 252 lb (114.3 kg)  Height: 5' (1.524 m)  Body mass index is 49.22 kg/m.       Physical Examination:   General appearance: alert, well appearing, and in no distress  Psych: mood appropriate, normal affect  Skin: warm & dry   Cardiovascular: normal heart rate noted  Respiratory: normal respiratory effort, no distress  Abdomen: obese, soft, non-tender   Pelvic: VULVA: normal appearing vulva with no masses, tenderness  or lesions, VAGINA: normal appearing vagina with normal color and discharge, no lesions, CERVIX: normal appearing cervix without discharge or lesions- strings not visualized  Extremities: no edema, no calf tenderness bilaterally  Chaperone:  pt declined     Assessment & Plan:  1) s/p cervical ablation -meeting postop milestones appropriately -continue pelvic rest for another 1-2 weeks -f/u in 50mos for repeat pap/HPV testing  2) Lost IUD strings -to be expected with ablation -no immediate intervention needed, discussed that once ready for removal will need to likely use IUD hook/forceps  No orders of the defined types were placed in this encounter.   Return in 6 months (on 08/21/2022) for 75mos pap follow up.   Janyth Pupa, DO Attending [place], [facility] for [employer], [place]
